# Patient Record
Sex: Male | Born: 1960 | Race: White | Hispanic: No | Marital: Single | Smoking: Current some day smoker
Health system: Southern US, Community
[De-identification: ages and names within clinical notes are randomized; demographics above are authoritative.]

## PROBLEM LIST (undated history)

## (undated) DIAGNOSIS — J449 Chronic obstructive pulmonary disease, unspecified: Secondary | ICD-10-CM

## (undated) DIAGNOSIS — I10 Essential (primary) hypertension: Secondary | ICD-10-CM

## (undated) DIAGNOSIS — I350 Nonrheumatic aortic (valve) stenosis: Secondary | ICD-10-CM

## (undated) DIAGNOSIS — I639 Cerebral infarction, unspecified: Secondary | ICD-10-CM

## (undated) DIAGNOSIS — K219 Gastro-esophageal reflux disease without esophagitis: Secondary | ICD-10-CM

## (undated) DIAGNOSIS — I499 Cardiac arrhythmia, unspecified: Secondary | ICD-10-CM

## (undated) DIAGNOSIS — J189 Pneumonia, unspecified organism: Secondary | ICD-10-CM

## (undated) DIAGNOSIS — IMO0001 Reserved for inherently not codable concepts without codable children: Secondary | ICD-10-CM

## (undated) DIAGNOSIS — G4730 Sleep apnea, unspecified: Secondary | ICD-10-CM

## (undated) DIAGNOSIS — K5900 Constipation, unspecified: Secondary | ICD-10-CM

## (undated) DIAGNOSIS — M1990 Unspecified osteoarthritis, unspecified site: Secondary | ICD-10-CM

## (undated) DIAGNOSIS — I2699 Other pulmonary embolism without acute cor pulmonale: Secondary | ICD-10-CM

## (undated) DIAGNOSIS — F1910 Other psychoactive substance abuse, uncomplicated: Secondary | ICD-10-CM

## (undated) DIAGNOSIS — I4891 Unspecified atrial fibrillation: Secondary | ICD-10-CM

## (undated) HISTORY — PX: FINGER SURGERY: SHX640

## (undated) HISTORY — PX: TONSILLECTOMY: SUR1361

## (undated) HISTORY — PX: HAND SURGERY: SHX662

---

## 2000-12-27 ENCOUNTER — Encounter: Payer: Self-pay | Admitting: *Deleted

## 2000-12-27 ENCOUNTER — Emergency Department (HOSPITAL_COMMUNITY): Admission: EM | Admit: 2000-12-27 | Discharge: 2000-12-27 | Payer: Self-pay | Admitting: *Deleted

## 2003-03-28 ENCOUNTER — Emergency Department (HOSPITAL_COMMUNITY): Admission: EM | Admit: 2003-03-28 | Discharge: 2003-03-29 | Payer: Self-pay

## 2003-04-30 ENCOUNTER — Emergency Department (HOSPITAL_COMMUNITY): Admission: EM | Admit: 2003-04-30 | Discharge: 2003-04-30 | Payer: Self-pay | Admitting: Emergency Medicine

## 2003-05-05 ENCOUNTER — Emergency Department (HOSPITAL_COMMUNITY): Admission: EM | Admit: 2003-05-05 | Discharge: 2003-05-06 | Payer: Self-pay | Admitting: Emergency Medicine

## 2003-08-21 ENCOUNTER — Other Ambulatory Visit: Payer: Self-pay

## 2004-08-07 ENCOUNTER — Ambulatory Visit: Payer: Self-pay | Admitting: Nurse Practitioner

## 2004-08-08 ENCOUNTER — Ambulatory Visit: Payer: Self-pay | Admitting: *Deleted

## 2004-08-22 ENCOUNTER — Ambulatory Visit: Payer: Self-pay | Admitting: Nurse Practitioner

## 2004-08-25 ENCOUNTER — Emergency Department (HOSPITAL_COMMUNITY): Admission: EM | Admit: 2004-08-25 | Discharge: 2004-08-25 | Payer: Self-pay | Admitting: Emergency Medicine

## 2005-01-20 ENCOUNTER — Emergency Department (HOSPITAL_COMMUNITY): Admission: EM | Admit: 2005-01-20 | Discharge: 2005-01-20 | Payer: Self-pay | Admitting: Emergency Medicine

## 2006-05-20 ENCOUNTER — Emergency Department (HOSPITAL_COMMUNITY): Admission: EM | Admit: 2006-05-20 | Discharge: 2006-05-21 | Payer: Self-pay | Admitting: Emergency Medicine

## 2007-01-18 ENCOUNTER — Emergency Department (HOSPITAL_COMMUNITY): Admission: EM | Admit: 2007-01-18 | Discharge: 2007-01-18 | Payer: Self-pay | Admitting: Emergency Medicine

## 2007-01-24 ENCOUNTER — Emergency Department (HOSPITAL_COMMUNITY): Admission: EM | Admit: 2007-01-24 | Discharge: 2007-01-24 | Payer: Self-pay | Admitting: Emergency Medicine

## 2007-02-02 ENCOUNTER — Emergency Department (HOSPITAL_COMMUNITY): Admission: EM | Admit: 2007-02-02 | Discharge: 2007-02-02 | Payer: Self-pay | Admitting: Emergency Medicine

## 2011-02-22 ENCOUNTER — Emergency Department (HOSPITAL_COMMUNITY)
Admission: EM | Admit: 2011-02-22 | Discharge: 2011-02-22 | Disposition: A | Discharge status: Home / Self Care | Payer: Self-pay | Attending: Emergency Medicine | Admitting: Emergency Medicine

## 2011-02-22 DIAGNOSIS — M545 Low back pain, unspecified: Secondary | ICD-10-CM | POA: Insufficient documentation

## 2011-02-22 DIAGNOSIS — I10 Essential (primary) hypertension: Secondary | ICD-10-CM | POA: Insufficient documentation

## 2011-03-13 ENCOUNTER — Emergency Department (HOSPITAL_COMMUNITY)
Admission: EM | Admit: 2011-03-13 | Discharge: 2011-03-13 | Disposition: A | Discharge status: Home / Self Care | Payer: Medicaid Other | Attending: Emergency Medicine | Admitting: Emergency Medicine

## 2011-03-13 ENCOUNTER — Emergency Department (HOSPITAL_COMMUNITY): Payer: Medicaid Other

## 2011-03-13 DIAGNOSIS — G8929 Other chronic pain: Secondary | ICD-10-CM | POA: Insufficient documentation

## 2011-03-13 DIAGNOSIS — J449 Chronic obstructive pulmonary disease, unspecified: Secondary | ICD-10-CM | POA: Insufficient documentation

## 2011-03-13 DIAGNOSIS — R05 Cough: Secondary | ICD-10-CM | POA: Insufficient documentation

## 2011-03-13 DIAGNOSIS — R0989 Other specified symptoms and signs involving the circulatory and respiratory systems: Secondary | ICD-10-CM | POA: Insufficient documentation

## 2011-03-13 DIAGNOSIS — T24039A Burn of unspecified degree of unspecified lower leg, initial encounter: Secondary | ICD-10-CM | POA: Insufficient documentation

## 2011-03-13 DIAGNOSIS — R059 Cough, unspecified: Secondary | ICD-10-CM | POA: Insufficient documentation

## 2011-03-13 DIAGNOSIS — Z79899 Other long term (current) drug therapy: Secondary | ICD-10-CM | POA: Insufficient documentation

## 2011-03-13 DIAGNOSIS — X19XXXA Contact with other heat and hot substances, initial encounter: Secondary | ICD-10-CM | POA: Insufficient documentation

## 2011-03-13 DIAGNOSIS — M549 Dorsalgia, unspecified: Secondary | ICD-10-CM | POA: Insufficient documentation

## 2011-03-13 DIAGNOSIS — R0609 Other forms of dyspnea: Secondary | ICD-10-CM | POA: Insufficient documentation

## 2011-03-13 DIAGNOSIS — T2102XA Burn of unspecified degree of abdominal wall, initial encounter: Secondary | ICD-10-CM | POA: Insufficient documentation

## 2011-03-13 DIAGNOSIS — I10 Essential (primary) hypertension: Secondary | ICD-10-CM | POA: Insufficient documentation

## 2011-03-13 DIAGNOSIS — J4489 Other specified chronic obstructive pulmonary disease: Secondary | ICD-10-CM | POA: Insufficient documentation

## 2011-03-13 LAB — POCT I-STAT TROPONIN I: Troponin i, poc: 0 ng/mL (ref 0.00–0.08)

## 2011-03-13 LAB — BASIC METABOLIC PANEL
BUN: 12 mg/dL (ref 6–23)
CO2: 28 mEq/L (ref 19–32)
Calcium: 9.1 mg/dL (ref 8.4–10.5)
Chloride: 105 mEq/L (ref 96–112)
Creatinine, Ser: 1.02 mg/dL (ref 0.50–1.35)
GFR calc Af Amer: >60 mL/min (ref >60)
GFR calc non Af Amer: >60 mL/min (ref >60)
Glucose, Bld: 107 mg/dL — ABNORMAL HIGH (ref 70–99)
Potassium: 3.9 mEq/L (ref 3.5–5.1)
Sodium: 141 mEq/L (ref 135–145)

## 2011-03-13 LAB — DIFFERENTIAL
Basophils Absolute: 0 10*3/uL (ref 0.0–0.1)
Basophils Relative: 0 % (ref 0–1)
Eosinophils Absolute: 0.2 10*3/uL (ref 0.0–0.7)
Eosinophils Relative: 3 % (ref 0–5)
Lymphocytes Relative: 21 % (ref 12–46)
Lymphs Abs: 1.4 10*3/uL (ref 0.7–4.0)
Monocytes Absolute: 0.5 10*3/uL (ref 0.1–1.0)
Monocytes Relative: 7 % (ref 3–12)
Neutro Abs: 4.4 10*3/uL (ref 1.7–7.7)
Neutrophils Relative %: 68 % (ref 43–77)

## 2011-03-13 LAB — CBC
HCT: 42.5 % (ref 39.0–52.0)
Hemoglobin: 14.9 g/dL (ref 13.0–17.0)
MCH: 31 pg (ref 26.0–34.0)
MCHC: 35.1 g/dL (ref 30.0–36.0)
MCV: 88.4 fL (ref 78.0–100.0)
Platelets: 208 10*3/uL (ref 150–400)
RBC: 4.81 MIL/uL (ref 4.22–5.81)
RDW: 12.8 % (ref 11.5–15.5)
WBC: 6.5 10*3/uL (ref 4.0–10.5)

## 2011-03-15 ENCOUNTER — Emergency Department (HOSPITAL_COMMUNITY): Admit: 2011-03-15 | Discharge: 2011-03-15 | Disposition: A | Discharge status: Home / Self Care | Payer: Medicaid Other

## 2011-03-15 ENCOUNTER — Emergency Department (HOSPITAL_COMMUNITY)
Admission: EM | Admit: 2011-03-15 | Discharge: 2011-03-15 | Discharge status: Against Medical Advice | Payer: Medicaid Other | Attending: Emergency Medicine | Admitting: Emergency Medicine

## 2011-03-15 DIAGNOSIS — R0602 Shortness of breath: Secondary | ICD-10-CM | POA: Insufficient documentation

## 2011-04-10 ENCOUNTER — Emergency Department (HOSPITAL_COMMUNITY): Payer: Medicaid Other

## 2011-04-10 ENCOUNTER — Emergency Department (HOSPITAL_COMMUNITY)
Admission: EM | Admit: 2011-04-10 | Discharge: 2011-04-10 | Disposition: A | Discharge status: Home / Self Care | Payer: Medicaid Other | Attending: Emergency Medicine | Admitting: Emergency Medicine

## 2011-04-10 DIAGNOSIS — IMO0002 Reserved for concepts with insufficient information to code with codable children: Secondary | ICD-10-CM | POA: Insufficient documentation

## 2011-04-10 DIAGNOSIS — M542 Cervicalgia: Secondary | ICD-10-CM | POA: Insufficient documentation

## 2011-04-10 DIAGNOSIS — M549 Dorsalgia, unspecified: Secondary | ICD-10-CM | POA: Insufficient documentation

## 2011-04-10 DIAGNOSIS — Y92410 Unspecified street and highway as the place of occurrence of the external cause: Secondary | ICD-10-CM | POA: Insufficient documentation

## 2011-04-10 DIAGNOSIS — I10 Essential (primary) hypertension: Secondary | ICD-10-CM | POA: Insufficient documentation

## 2011-04-10 DIAGNOSIS — M25569 Pain in unspecified knee: Secondary | ICD-10-CM | POA: Insufficient documentation

## 2011-04-10 DIAGNOSIS — Y998 Other external cause status: Secondary | ICD-10-CM | POA: Insufficient documentation

## 2011-04-17 ENCOUNTER — Emergency Department (HOSPITAL_COMMUNITY)
Admission: EM | Admit: 2011-04-17 | Discharge: 2011-04-17 | Discharge status: Against Medical Advice | Payer: Medicaid Other | Attending: Emergency Medicine | Admitting: Emergency Medicine

## 2011-04-17 DIAGNOSIS — R51 Headache: Secondary | ICD-10-CM | POA: Insufficient documentation

## 2012-07-29 ENCOUNTER — Emergency Department (HOSPITAL_COMMUNITY): Payer: Medicaid Other

## 2012-07-29 ENCOUNTER — Encounter (HOSPITAL_COMMUNITY): Payer: Self-pay

## 2012-07-29 ENCOUNTER — Emergency Department (HOSPITAL_COMMUNITY)
Admission: EM | Admit: 2012-07-29 | Discharge: 2012-07-30 | Disposition: A | Discharge status: Other Acute Inpatient Hospital | Payer: Medicaid Other | Source: Home / Self Care | Attending: Emergency Medicine | Admitting: Emergency Medicine

## 2012-07-29 DIAGNOSIS — F1420 Cocaine dependence, uncomplicated: Secondary | ICD-10-CM | POA: Diagnosis present

## 2012-07-29 DIAGNOSIS — F17200 Nicotine dependence, unspecified, uncomplicated: Secondary | ICD-10-CM | POA: Insufficient documentation

## 2012-07-29 DIAGNOSIS — Z79899 Other long term (current) drug therapy: Secondary | ICD-10-CM

## 2012-07-29 DIAGNOSIS — F1020 Alcohol dependence, uncomplicated: Principal | ICD-10-CM | POA: Diagnosis present

## 2012-07-29 DIAGNOSIS — J4489 Other specified chronic obstructive pulmonary disease: Secondary | ICD-10-CM | POA: Diagnosis present

## 2012-07-29 DIAGNOSIS — Z8739 Personal history of other diseases of the musculoskeletal system and connective tissue: Secondary | ICD-10-CM | POA: Insufficient documentation

## 2012-07-29 DIAGNOSIS — Z7901 Long term (current) use of anticoagulants: Secondary | ICD-10-CM

## 2012-07-29 DIAGNOSIS — F329 Major depressive disorder, single episode, unspecified: Secondary | ICD-10-CM

## 2012-07-29 DIAGNOSIS — I10 Essential (primary) hypertension: Secondary | ICD-10-CM | POA: Diagnosis present

## 2012-07-29 DIAGNOSIS — Z8679 Personal history of other diseases of the circulatory system: Secondary | ICD-10-CM | POA: Insufficient documentation

## 2012-07-29 DIAGNOSIS — F1410 Cocaine abuse, uncomplicated: Secondary | ICD-10-CM | POA: Insufficient documentation

## 2012-07-29 DIAGNOSIS — F3289 Other specified depressive episodes: Secondary | ICD-10-CM | POA: Insufficient documentation

## 2012-07-29 DIAGNOSIS — I4891 Unspecified atrial fibrillation: Secondary | ICD-10-CM | POA: Diagnosis present

## 2012-07-29 DIAGNOSIS — J449 Chronic obstructive pulmonary disease, unspecified: Secondary | ICD-10-CM | POA: Diagnosis present

## 2012-07-29 DIAGNOSIS — R45851 Suicidal ideations: Secondary | ICD-10-CM | POA: Insufficient documentation

## 2012-07-29 HISTORY — DX: Essential (primary) hypertension: I10

## 2012-07-29 HISTORY — DX: Unspecified osteoarthritis, unspecified site: M19.90

## 2012-07-29 HISTORY — DX: Unspecified atrial fibrillation: I48.91

## 2012-07-29 HISTORY — DX: Other psychoactive substance abuse, uncomplicated: F19.10

## 2012-07-29 LAB — CBC
MCHC: 33.6 g/dL (ref 30.0–36.0)
Platelets: 214 10*3/uL (ref 150–400)
RDW: 13.2 % (ref 11.5–15.5)

## 2012-07-29 LAB — COMPREHENSIVE METABOLIC PANEL
ALT: 17 U/L (ref 0–53)
AST: 18 U/L (ref 0–37)
Albumin: 3.3 g/dL — ABNORMAL LOW (ref 3.5–5.2)
Alkaline Phosphatase: 59 U/L (ref 39–117)
Potassium: 3.8 mEq/L (ref 3.5–5.1)
Sodium: 140 mEq/L (ref 135–145)
Total Protein: 6.4 g/dL (ref 6.0–8.3)

## 2012-07-29 LAB — ETHANOL: Alcohol, Ethyl (B): <11 mg/dL (ref 0–11)

## 2012-07-29 MED ORDER — ZOLPIDEM TARTRATE 5 MG PO TABS: 5.0000 mg | ORAL_TABLET | Freq: Every evening | ORAL | Status: DC | PRN

## 2012-07-29 MED ORDER — ONDANSETRON HCL 8 MG PO TABS: 4.0000 mg | ORAL_TABLET | Freq: Three times a day (TID) | ORAL | Status: DC | PRN

## 2012-07-29 MED ORDER — NICOTINE 21 MG/24HR TD PT24: 21.0000 mg | MEDICATED_PATCH | Freq: Every day | TRANSDERMAL | Status: DC

## 2012-07-29 MED ORDER — LORAZEPAM 1 MG PO TABS: 1.0000 mg | ORAL_TABLET | Freq: Three times a day (TID) | ORAL | Status: DC | PRN

## 2012-07-29 MED ORDER — ALUM & MAG HYDROXIDE-SIMETH 200-200-20 MG/5ML PO SUSP: 30.0000 mL | ORAL | Status: DC | PRN

## 2012-07-29 MED ORDER — IBUPROFEN 200 MG PO TABS: 600.0000 mg | ORAL_TABLET | Freq: Three times a day (TID) | ORAL | Status: DC | PRN

## 2012-07-29 NOTE — ED Notes (Signed)
Press photographer and house coverage notified for sitter.

## 2012-07-29 NOTE — ED Provider Notes (Signed)
History   This chart was scribed for Clinton Sawyer, non-physician practitioner, working with Glynn Octave, MD by Charolett Bumpers, ED Scribe. This patient was seen in room TR11C/TR11C and the patient's care was started at 1901.    CSN: [id number]  Arrival date & time 07/29/12  1738   First MD Initiated Contact with Patient 07/29/12 1901      Chief Complaint  Patient presents with  . Medical Clearance   Level V Caveat: Intoxication  The history is provided by the patient. The history is limited by the condition of the patient. No language interpreter was used.   Donald [name] is a 52 y.o. male who presents to the Emergency Department for medical clearance. He state that he is here for detox from ETOH and crack cocaine. He states that he last used crack cocaine at 3 am today and last used alcohol this morning. He states that he drinks daily, unsure of amount but states he drinks a lot and that he doesn't remember the last time he was sober. He reports SI with a plan to jump off a bridge. He denies any HI, hallucinations. He is unsure if he has ever had alcohol related seizures. He denies any SOB, CP, nausea, vomiting. Hx is limited due to Level V Caveat: Intoxication.    Past Medical History  Diagnosis Date  . Substance abuse   . A-fib   . Hypertension   . Arthritis     History reviewed. No pertinent past surgical history.  No family history on file.  History  Substance Use Topics  . Smoking status: Current Some Day Smoker  . Smokeless tobacco: Not on file  . Alcohol Use: Yes      Review of Systems  Unable to perform ROS: Other  Intoxication  Allergies  Review of patient's allergies indicates no known allergies.  Home Medications  No current outpatient prescriptions on file.  BP 147/84  Pulse 83  Temp 98.1 F (36.7 C) (Oral)  SpO2 100%  Physical Exam  Nursing note and vitals reviewed. Constitutional: He is oriented to person, place, and time. He  appears well-developed and well-nourished. No distress.       Pt smells of ETOH, appears intoxicated, slurred speech.   HENT:  Head: Normocephalic and atraumatic.  Eyes: EOM are normal. Pupils are equal, round, and reactive to light.  Neck: Neck supple. No tracheal deviation present.  Cardiovascular: Normal rate, regular rhythm, normal heart sounds and intact distal pulses.   Pulmonary/Chest: Effort normal. No respiratory distress.       Scattered inspiratory and expiratory crackles.   Musculoskeletal: Normal range of motion.  Neurological: He is alert and oriented to person, place, and time.  Skin: Skin is warm and dry.  Psychiatric: He has a normal mood and affect. His behavior is normal. His speech is tangential and slurred. He expresses suicidal ideation. He expresses no homicidal ideation. He expresses suicidal plans.    ED Course  Procedures (including critical care time)  DIAGNOSTIC STUDIES: Oxygen Saturation is 100% on room air, normal by my interpretation.    COORDINATION OF CARE:  19:20-Discussed planned course of treatment with the patient, who is agreeable at this time. Will order a chest x-ray, blood work, urine screen, consultation with ACT and move pt to Pod C.     Labs Reviewed - No data to display Dg Chest 2 View  07/29/2012  *RADIOLOGY REPORT*  Clinical Data: Medical clearance.  CHEST - 2 VIEW  Comparison: 03/13/2011  Findings: The cardiomediastinal silhouette is unremarkable. COPD/emphysema noted. There is no evidence of focal airspace disease, pulmonary edema, suspicious pulmonary nodule/mass, pleural effusion, or pneumothorax. No acute bony abnormalities are identified. Remote left rib fractures are identified.  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   Original Report Authenticated By: Harmon Pier, M.D.      No diagnosis found.    MDM  52 year old male presenting for detox from alcohol and cocaine. He is suicidal. ACT  team consulted and will evaluate patient. Patient medically cleared and moved to pod C.   I personally performed the services described in this documentation, which was scribed in my presence. The recorded information has been reviewed and is accurate.      Trevor Mace, PA-C 07/29/12 2339

## 2012-07-29 NOTE — ED Notes (Signed)
Pt states he drank a "pint" of gin. Pt states last drink was at approximately 0300.

## 2012-07-29 NOTE — ED Notes (Signed)
Belongings inventoried by this RN, locked in locker 3. Will wait to patient more awake to sign for medications to give to pharmacy.

## 2012-07-29 NOTE — ED Notes (Signed)
Pt transported to radiology.

## 2012-07-29 NOTE — ED Notes (Signed)
Security paged to wand pt. Pt resting.

## 2012-07-29 NOTE — ED Notes (Signed)
Pt states he is here for detox from ETOH and crack cocaine. Also reports that he has been having thoughts of jumping off of a bridge to kill himself for a day or so.

## 2012-07-29 NOTE — ED Notes (Signed)
Pt waned by security.

## 2012-07-30 ENCOUNTER — Inpatient Hospital Stay (HOSPITAL_COMMUNITY)
Admission: EM | Admit: 2012-07-30 | Discharge: 2012-08-03 | DRG: 897 | Disposition: A | Discharge status: Home / Self Care | Payer: Medicaid Other | Source: Intra-hospital | Attending: Psychiatry | Admitting: Psychiatry

## 2012-07-30 ENCOUNTER — Encounter (HOSPITAL_COMMUNITY): Payer: Self-pay | Admitting: Behavioral Health

## 2012-07-30 DIAGNOSIS — F1010 Alcohol abuse, uncomplicated: Secondary | ICD-10-CM

## 2012-07-30 DIAGNOSIS — F1410 Cocaine abuse, uncomplicated: Secondary | ICD-10-CM

## 2012-07-30 DIAGNOSIS — F1020 Alcohol dependence, uncomplicated: Secondary | ICD-10-CM | POA: Diagnosis present

## 2012-07-30 DIAGNOSIS — F1994 Other psychoactive substance use, unspecified with psychoactive substance-induced mood disorder: Secondary | ICD-10-CM

## 2012-07-30 DIAGNOSIS — F1420 Cocaine dependence, uncomplicated: Secondary | ICD-10-CM | POA: Diagnosis present

## 2012-07-30 HISTORY — DX: Chronic obstructive pulmonary disease, unspecified: J44.9

## 2012-07-30 LAB — URINALYSIS, ROUTINE W REFLEX MICROSCOPIC
Hgb urine dipstick: NEGATIVE
Ketones, ur: NEGATIVE mg/dL
Protein, ur: NEGATIVE mg/dL
Urobilinogen, UA: 0.2 mg/dL (ref 0.0–1.0)

## 2012-07-30 LAB — URINE MICROSCOPIC-ADD ON

## 2012-07-30 LAB — RAPID URINE DRUG SCREEN, HOSP PERFORMED
Amphetamines: NOT DETECTED
Benzodiazepines: NOT DETECTED
Tetrahydrocannabinol: NOT DETECTED

## 2012-07-30 LAB — PROTIME-INR: Prothrombin Time: 13 seconds (ref 11.6–15.2)

## 2012-07-30 MED ORDER — MAGNESIUM HYDROXIDE 400 MG/5ML PO SUSP: 30.0000 mL | Freq: Every day | ORAL | Status: DC | PRN

## 2012-07-30 MED ORDER — DILTIAZEM HCL ER COATED BEADS 180 MG PO CP24
180.0000 mg | ORAL_CAPSULE | Freq: Every day | ORAL | Status: DC
  Administered 2012-07-30 – 2012-08-03 (×5): 180 mg via ORAL
  Filled 2012-07-30 (×7): qty 1

## 2012-07-30 MED ORDER — LORAZEPAM 1 MG PO TABS: 0.0000 mg | ORAL_TABLET | Freq: Four times a day (QID) | ORAL | Status: DC

## 2012-07-30 MED ORDER — LORAZEPAM 2 MG/ML IJ SOLN: 1.0000 mg | Freq: Four times a day (QID) | INTRAMUSCULAR | Status: DC | PRN

## 2012-07-30 MED ORDER — LORAZEPAM 1 MG PO TABS: 0.0000 mg | ORAL_TABLET | Freq: Two times a day (BID) | ORAL | Status: DC

## 2012-07-30 MED ORDER — METOPROLOL TARTRATE 50 MG PO TABS
50.0000 mg | ORAL_TABLET | Freq: Two times a day (BID) | ORAL | Status: DC
  Administered 2012-07-30 – 2012-08-03 (×8): 50 mg via ORAL
  Filled 2012-07-30 (×11): qty 1

## 2012-07-30 MED ORDER — WARFARIN - PHARMACIST DOSING INPATIENT
Freq: Every day | Status: DC
  Administered 2012-08-01 – 2012-08-02 (×2)
  Filled 2012-07-30 (×4): qty 1

## 2012-07-30 MED ORDER — ACETAMINOPHEN 325 MG PO TABS: 650.0000 mg | ORAL_TABLET | Freq: Four times a day (QID) | ORAL | Status: DC | PRN

## 2012-07-30 MED ORDER — AMIODARONE HCL 200 MG PO TABS
200.0000 mg | ORAL_TABLET | Freq: Every day | ORAL | Status: DC
  Administered 2012-07-30 – 2012-08-03 (×5): 200 mg via ORAL
  Filled 2012-07-30 (×6): qty 1

## 2012-07-30 MED ORDER — FOLIC ACID 1 MG PO TABS: 1.0000 mg | ORAL_TABLET | Freq: Every day | ORAL | Status: DC

## 2012-07-30 MED ORDER — ADULT MULTIVITAMIN W/MINERALS CH: 1.0000 | ORAL_TABLET | Freq: Every day | ORAL | Status: DC

## 2012-07-30 MED ORDER — LORAZEPAM 1 MG PO TABS: 1.0000 mg | ORAL_TABLET | Freq: Four times a day (QID) | ORAL | Status: DC | PRN

## 2012-07-30 MED ORDER — WARFARIN SODIUM 5 MG PO TABS
7.5000 mg | ORAL_TABLET | Freq: Once | ORAL | Status: AC
  Administered 2012-07-30: 7.5 mg via ORAL
  Filled 2012-07-30: qty 2
  Filled 2012-07-30: qty 1.5

## 2012-07-30 MED ORDER — VITAMIN B-1 100 MG PO TABS: 100.0000 mg | ORAL_TABLET | Freq: Every day | ORAL | Status: DC

## 2012-07-30 MED ORDER — ALBUTEROL SULFATE HFA 108 (90 BASE) MCG/ACT IN AERS
2.0000 | INHALATION_SPRAY | Freq: Four times a day (QID) | RESPIRATORY_TRACT | Status: DC | PRN
  Administered 2012-07-30 – 2012-08-03 (×5): 2 via RESPIRATORY_TRACT
  Filled 2012-07-30: qty 6.7

## 2012-07-30 MED ORDER — NICOTINE 21 MG/24HR TD PT24
21.0000 mg | MEDICATED_PATCH | Freq: Every day | TRANSDERMAL | Status: DC
  Administered 2012-07-31 – 2012-08-01 (×2): 21 mg via TRANSDERMAL
  Filled 2012-07-30 (×5): qty 1

## 2012-07-30 MED ORDER — THIAMINE HCL 100 MG/ML IJ SOLN: 100.0000 mg | Freq: Every day | INTRAMUSCULAR | Status: DC

## 2012-07-30 MED ORDER — DILTIAZEM HCL ER BEADS 180 MG PO CP24: 180.0000 mg | ORAL_CAPSULE | Freq: Every day | ORAL | Status: DC

## 2012-07-30 MED ORDER — QUETIAPINE FUMARATE 25 MG PO TABS
25.0000 mg | ORAL_TABLET | Freq: Every day | ORAL | Status: DC
  Administered 2012-07-31: 25 mg via ORAL
  Filled 2012-07-30 (×7): qty 1

## 2012-07-30 MED ORDER — ALUM & MAG HYDROXIDE-SIMETH 200-200-20 MG/5ML PO SUSP: 30.0000 mL | ORAL | Status: DC | PRN

## 2012-07-30 NOTE — ED Notes (Signed)
Pt. Glasses taken out of belongings bag and given to patient.

## 2012-07-30 NOTE — Progress Notes (Signed)
ANTICOAGULATION CONSULT NOTE - Initial Consult  Pharmacy Consult for Coumadin Indication: atrial fibrillation  No Known Allergies  Patient Measurements: Height: 6' (182.9 cm) Weight: 196 lb (88.905 kg) IBW/kg (Calculated) : 77.6   Vital Signs: Temp: 98.9 F (37.2 C) (01/30 1700) Temp src: Oral (01/30 1700) BP: 112/76 mmHg (01/30 1705) Pulse Rate: 118  (01/30 1705)  Labs:  Basename 07/30/12 1956 07/29/12 2009  HGB -- 14.4  HCT -- 42.9  PLT -- 214  APTT -- --  LABPROT 13.0 --  INR 0.99 --  HEPARINUNFRC -- --  CREATININE -- 1.10  CKTOTAL -- --  CKMB -- --  TROPONINI -- --   Estimated Creatinine Clearance: 85.2 ml/min (by C-G formula based on Cr of 1.1).  Medical History: Past Medical History  Diagnosis Date  . Substance abuse   . A-fib   . Hypertension   . Arthritis   . COPD (chronic obstructive pulmonary disease)    Medications:  Prescriptions prior to admission  Medication Sig Dispense Refill  . albuterol (PROVENTIL HFA;VENTOLIN HFA) 108 (90 BASE) MCG/ACT inhaler Inhale 2 puffs into the lungs every 6 (six) hours as needed.      Marland Kitchen amiodarone (PACERONE) 200 MG tablet Take 200 mg by mouth daily.      Marland Kitchen diltiazem (TIAZAC) 180 MG 24 hr capsule Take 180 mg by mouth daily.      . metoprolol (LOPRESSOR) 50 MG tablet Take 50 mg by mouth 2 (two) times daily.      Marland Kitchen warfarin (COUMADIN) 5 MG tablet Take 5-10 mg by mouth daily. Take 5 mg every except take 10 mg on Fridays       Assessment:  51yo M admitted with depressive disorder and EtOH dependence.  On Coumadin chronically for A.fib but has not taken it for one week. INR is at baseline.  The regimen he reports above differs from the last prescription picked up: 5mg  daily except 2.5mg  on Mon, Fri.  He reports compliance with his amiodarone and his other meds.  Goal of Therapy:  INR 2-3 Monitor platelets by anticoagulation protocol: Yes   Plan:   Give Coumadin 7.5mg  tonight.  Check PT/INR daily.  Charolotte Eke, PharmD, pager [phone]. 07/30/2012,8:43 PM.

## 2012-07-30 NOTE — BH Assessment (Signed)
Assessment Note   Donald [name] is an 52 y.o. male who presents seeking detox and reporting suicidal ideation with a plan to jump off a bridge.  He also reports he has a pistol, but states he does not know where it is right now. Donald [name] states he believes his depression is triggered by his worsening COPD and reports he is overwhelmed by it.  He is also drinking a pint of gin daily in addition to as many beers as he can get.  He has been drinking at this level for approximately 2 weeks after a 7 month period of sobriety.  In addition, he is also abusing Crack at approximately $100 a day for the last month.  He denies HI or AVH.  Axis I: Depressive Disorder NOS, Depressive Disorder secondary to general medical condition, Substance Abuse and Alcohol Dependence Axis II: Deferred Axis III:  Past Medical History  Diagnosis Date  . Substance abuse   . A-fib   . Hypertension   . Arthritis    Axis IV: housing problems and problems with access to health care services Axis V: 41-50 serious symptoms  Past Medical History:  Past Medical History  Diagnosis Date  . Substance abuse   . A-fib   . Hypertension   . Arthritis     History reviewed. No pertinent past surgical history.  Family History: No family history on file.  Social History:  reports that he has been smoking.  He does not have any smokeless tobacco history on file. He reports that he drinks alcohol. He reports that he uses illicit drugs (Cocaine).  Additional Social History:  Alcohol / Drug Use History of alcohol / drug use?: Yes Substance #1 Name of Substance 1: Alcohol-Beer and Gin 1 - Age of First Use: unk 1 - Amount (size/oz): pint of gin and as many beers as possible 1 - Frequency: daily 1 - Duration: 2 weeks 1 - Last Use / Amount: 07/29/12 pint plus beer Substance #2 Name of Substance 2: Crack 2 - Age of First Use: unk 2 - Amount (size/oz): $100+ 2 - Frequency: daily 2 - Duration: 1 month 2 - Last Use / Amount:  07/29/12  CIWA: CIWA-Ar BP: 147/84 mmHg Pulse Rate: 83  Nausea and Vomiting: no nausea and no vomiting Tactile Disturbances: very mild itching, pins and needles, burning or numbness Tremor: no tremor Auditory Disturbances: not present Paroxysmal Sweats: no sweat visible Visual Disturbances: not present Anxiety: mildly anxious Headache, Fullness in Head: none present Agitation: normal activity Orientation and Clouding of Sensorium: oriented and can do serial additions CIWA-Ar Total: 2  COWS:    Allergies: No Known Allergies  Home Medications:  (Not in a hospital admission)  OB/GYN Status:  No LMP for male patient.  General Assessment Data Location of Assessment: [facility] ED Living Arrangements: Other (Comment);Alone (homeless in [place]) Can pt return to current living arrangement?: Yes Admission Status: Voluntary Is patient capable of signing voluntary admission?: Yes Transfer from: Acute Hospital Referral Source: Self/Family/Friend  Education Status Is patient currently in school?: No  Risk to self Suicidal Ideation: Yes-Currently Present Suicidal Intent: No-Not Currently/Within Last 6 Months Is patient at risk for suicide?: Yes Suicidal Plan?: Yes-Currently Present Specify Current Suicidal Plan: jump off a bridge Access to Means: Yes Specify Access to Suicidal Means: environmental, also reports firearm What has been your use of drugs/alcohol within the last 12 months?: ongoing Previous Attempts/Gestures: No Intentional Self Injurious Behavior: Damaging (drinking) Family Suicide History: No Recent stressful  life event(s): Recent negative physical changes (COPD) Persecutory voices/beliefs?: No Depression: Yes Depression Symptoms: Despondent;Isolating;Fatigue;Guilt;Loss of interest in usual pleasures;Feeling worthless/self pity;Feeling angry/irritable Substance abuse history and/or treatment for substance abuse?: Yes Suicide prevention information given to  non-admitted patients: Not applicable  Risk to Others Homicidal Ideation: No Thoughts of Harm to Others: No Current Homicidal Intent: No Current Homicidal Plan: No Access to Homicidal Means: No History of harm to others?: No Assessment of Violence: None Noted Does patient have access to weapons?: No Criminal Charges Pending?: No Does patient have a court date: No  Psychosis Hallucinations: None noted Delusions: None noted  Mental Status Report Appear/Hygiene: Disheveled Eye Contact: Poor Motor Activity: Freedom of movement Speech: Slurred Level of Consciousness: Drowsy Mood: Depressed Affect: Appropriate to circumstance Anxiety Level: None Thought Processes: Coherent;Relevant Judgement: Impaired Orientation: Time;Place;Person;Situation Obsessive Compulsive Thoughts/Behaviors: None  Cognitive Functioning Concentration: Decreased Memory: Recent Intact;Remote Intact IQ: Average Insight: Fair Impulse Control: Poor Appetite: Fair Sleep: No Change Vegetative Symptoms: None  ADLScreening [facility]) Patient's cognitive ability adequate to safely complete daily activities?: Yes Patient able to express need for assistance with ADLs?: Yes Independently performs ADLs?: Yes (appropriate for developmental age)  Abuse/Neglect [facility]) Physical Abuse: Denies Verbal Abuse: Denies Sexual Abuse: Denies  Prior Inpatient Therapy Prior Inpatient Therapy: Yes Prior Therapy Dates: can't remember Prior Therapy Facilty/Provider(s): [facility] Reason for Treatment: Detox  Prior Outpatient Therapy Prior Outpatient Therapy: No  ADL Screening (condition at time of admission) Patient's cognitive ability adequate to safely complete daily activities?: Yes Patient able to express need for assistance with ADLs?: Yes Independently performs ADLs?: Yes (appropriate for developmental age)       Abuse/Neglect Assessment (Assessment to be complete while patient is alone) Physical  Abuse: Denies Verbal Abuse: Denies Sexual Abuse: Denies Exploitation of patient/patient's resources: Denies Self-Neglect: Denies Values / Beliefs Cultural Requests During Hospitalization: None Spiritual Requests During Hospitalization: None   Advance Directives (For Healthcare) Advance Directive: Patient does not have advance directive;Patient would not like information Nutrition Screen- [facility] Adult/[facility]/AP Patient's home diet: Regular  Additional Information 1:1 In Past 12 Months?: No CIRT Risk: No Elopement Risk: No Does patient have medical clearance?: Yes     Disposition:  Disposition Disposition of Patient: Inpatient treatment program Type of inpatient treatment program: Adult  On Site Evaluation by:   Reviewed with Physician:     Steward Ros 07/30/2012 1:28 AM

## 2012-07-30 NOTE — ED Notes (Signed)
Pt. Up at nurses station making a phone call to director of a half-way house in [place]. Pt states "I just want him to know that I'm here and I'm all right".

## 2012-07-30 NOTE — Progress Notes (Signed)
D. Pt has been in room and resting for much of the shift today, pt has been visualized interacting appropriately with fellow peers in dayroom. Pt has spoken about how he was feeling suicidal and about his substance abuse issues. Pt spoke about being in and out of rehab and how he will be able to get medicaid part B starting in February. Pt has spoken about not being able to stop abusing alcohol and requesting help. A. Pt provided with support and encouragement. R. Will continue to monitor.

## 2012-07-30 NOTE — ED Notes (Signed)
ACT team at bedside.  

## 2012-07-30 NOTE — Progress Notes (Signed)
Patient did attend the evening karaoke group. Pt was engaged and supportive.   

## 2012-07-30 NOTE — BH Assessment (Signed)
[facility] Assessment Progress Note      Donald [name] has been accepted to [facility] by Donell Sievert to [facility] and will go to room 305-1.

## 2012-07-30 NOTE — ED Provider Notes (Signed)
Medical screening examination/treatment/procedure(s) were performed by non-physician practitioner and as supervising physician I was immediately available for consultation/collaboration.   Glynn Octave, MD 07/30/12 0010

## 2012-07-30 NOTE — Tx Team (Signed)
Initial Interdisciplinary Treatment Plan  PATIENT STRENGTHS: (choose at least two) Ability for insight Capable of independent living Communication skills Motivation for treatment/growth  PATIENT STRESSORS: Financial difficulties Health problems Substance abuse   PROBLEM LIST: Problem List/Patient Goals Date to be addressed Date deferred Reason deferred Estimated date of resolution  Substance Abuse      Suicide Ideation                                                 DISCHARGE CRITERIA:  Ability to meet basic life and health needs Improved stabilization in mood, thinking, and/or behavior Motivation to continue treatment in a less acute level of care  PRELIMINARY DISCHARGE PLAN: Attend aftercare/continuing care group Attend 12-step recovery group Outpatient therapy  PATIENT/FAMIILY INVOLVEMENT: This treatment plan has been presented to and reviewed with the patient, Donald [name].  The patient and family have been given the opportunity to ask questions and make suggestions.  Harold Barban E 07/30/2012, 10:39 AM

## 2012-07-30 NOTE — ED Provider Notes (Signed)
  Physical Exam  BP 119/72  Pulse 86  Temp 97.9 F (36.6 C) (Oral)  Resp 20  SpO2 93%  Physical Exam  ED Course  Procedures  MDM Pt accepted at [facility] by Dr. Dara Hoyer, MD 07/30/12 [phone]

## 2012-07-30 NOTE — Progress Notes (Signed)
Admission Note  D: Patient admitted to [facility] from [facility] ED. Patient appropriate and cooperative with staff. Per report, patient was SI with a plan to jump off a bridge. Patient verbalized that he abuses alcohol and cocaine.  A: Support and encouragement provided to patient. Oriented patient to the unit and informed him of the unit rules/policies. Initiated Q15 minute checks for safety.  R: Patient receptive. Denies SI/HI/AVH. Patient remains safe on the unit.

## 2012-07-30 NOTE — H&P (Signed)
Psychiatric Admission Assessment Adult  Patient Identification:  Donald [name]  Date of Evaluation:  07/30/2012  Chief Complaint:  Depressive Disorder ETOH Dependence  History of Present Illness: This is a 52 year old Caucasian male, admitted to [facility] from the [facility] ED with reports of suicidal ideations with plans to jump off of a bridge.  Patient also indicated that he owned a pistol that he could use to hurt himself if he knew where it is. And [name] reports during this assessment, "I was referred to the [facility] ED by the [facility]. I had asked for help for my alcoholism. I have been abusing alcohol for a long time. I like the feel and effects of alcohol on me. I also use crack cocaine as much as I can get. I'm a binge crack smoker. Alcohol has done a number on me. In 2004, I spent 10 months in prison for drunk driving. Alcoholism has caused me a lot of relationships, jobs and I lost my home too. I am currently homeless. I was at the [facility] 3 days ago for help for my alcoholism, the assessment people did not even look at me. They turned me away. I have to admit that I have a problem. I have been through series of rehabilitation and treatment programs in the past, but I have not been able to quit this mess. The longest that I have been sober was 7 months. I have been at the [facility] treatment [facility] numerous times. I am disabled, with a lot of heart problems. I take coumadin. It was prescribed for me by a doctor at the [facility] in [place], [place]. I have not taken any of my medicines in about 1 week. My situation depresses me because it is not getting any better".  O: Patient denies any withdrawal symptoms at this time.  Elements:  Location:  [facility] adult unit,. Quality:  "I crave & drink alcohol all day, I'm a binge crack smoker as much as I can get". Severity:  "It has worsened over last 4-6 months, I drink and use  daily". Timing:  "4-6 months".. Duration:  "i have a been an alcoholic for a long time".. Context:  "I'm homeless, I'm suicidal and I'm depressed"..  Associated Signs/Synptoms:  Depression Symptoms:  depressed mood, feelings of worthlessness/guilt, hopelessness, suicidal thoughts with specific plan,  (Hypo) Manic Symptoms:  Irritable Mood,  Anxiety Symptoms:  Excessive Worry,  Psychotic Symptoms:  Denies hallucinations, delusions and or paranoia.  PTSD Symptoms: Had a traumatic exposure:  None reported.  Psychiatric Specialty Exam: Physical Exam  Constitutional: He is oriented to person, place, and time. He appears well-developed.  HENT:  Head: Normocephalic.  Eyes: Pupils are equal, round, and reactive to light.  Neck: Normal range of motion.  Cardiovascular: Normal rate.   Respiratory: Effort normal.  GI: Soft.  Musculoskeletal: Normal range of motion.  Neurological: He is alert and oriented to person, place, and time.  Skin: Skin is warm and dry.  Psychiatric: Judgment normal. His mood appears not anxious. His affect is not angry and not blunt. He is slowed. He is not agitated, not aggressive, is not hyperactive, not withdrawn, not actively hallucinating and not combative. Thought content is not paranoid and not delusional. Cognition and memory are normal. He does not exhibit a depressed mood. He expresses suicidal ideation. He expresses no homicidal ideation. He expresses suicidal plans. He expresses no homicidal plans. He is inattentive.  Review of Systems  Constitutional: Negative.   HENT: Negative.   Eyes: Negative.   Respiratory: Negative.   Cardiovascular: Negative.   Gastrointestinal: Negative.   Genitourinary: Negative.   Musculoskeletal: Negative.   Skin: Negative.   Neurological: Negative for dizziness, tingling, tremors, sensory change, speech change, focal weakness, seizures and loss of consciousness.  Endo/Heme/Allergies: Bruises/bleeds easily (Due to  coumadin use).  Psychiatric/Behavioral: Positive for suicidal ideas and substance abuse (Hx alcohol and crack cocaine abuse.). Negative for depression, hallucinations and memory loss. The patient is not nervous/anxious and does not have insomnia.     Blood pressure 131/91, pulse 99, temperature 98.2 F (36.8 C), temperature source Oral, resp. rate 20, height 6' (1.829 m), weight 88.905 kg (196 lb).Body mass index is 26.58 kg/(m^2).  General Appearance: Disheveled  Eye Solicitor::  Fair  Speech:  Slow  Volume:  Decreased  Mood:  Depressed  Affect:  Constricted  Thought Process:  Disorganized and Tangential  Orientation:  Full (Time, Place, and Person)  Thought Content:  Rumination  Suicidal Thoughts:  Yes.  with intent/plan  Homicidal Thoughts:  No  Memory:  Immediate;   Good Recent;   Good Remote;   Good  Judgement:  Impaired  Insight:  Shallow  Psychomotor Activity:  Decreased  Concentration:  Fair  Recall:  Good  Akathisia:  No  Handed:  Right  AIMS (if indicated):     Assets:  Desire for Improvement  Sleep:       Past Psychiatric History: Diagnosis:  Hospitalizations:  Outpatient Care:  Substance Abuse Care:  Self-Mutilation:  Suicidal Attempts:  Violent Behaviors:   Past Medical History:   Past Medical History  Diagnosis Date  . Substance abuse   . A-fib   . Hypertension   . Arthritis   . COPD (chronic obstructive pulmonary disease)    Cardiac History:  A-fib, HTN, COPD  Allergies:  No Known Allergies  PTA Medications: Prescriptions prior to admission  Medication Sig Dispense Refill  . albuterol (PROVENTIL HFA;VENTOLIN HFA) 108 (90 BASE) MCG/ACT inhaler Inhale 2 puffs into the lungs every 6 (six) hours as needed.      Marland Kitchen amiodarone (PACERONE) 200 MG tablet Take 200 mg by mouth daily.      Marland Kitchen diltiazem (TIAZAC) 180 MG 24 hr capsule Take 180 mg by mouth daily.      . metoprolol (LOPRESSOR) 50 MG tablet Take 50 mg by mouth 2 (two) times daily.      Marland Kitchen warfarin  (COUMADIN) 5 MG tablet Take 5-10 mg by mouth daily. Take 5 mg every except take 10 mg on Fridays        Previous Psychotropic Medications:  Medication/Dose  Coumadin 2.5 mg on Mondays & Fridays, Coumadin 5 mg on Tues, Wed, Thurs, Sat and Sun.               Substance Abuse History in the last 12 months:  yes  Consequences of Substance Abuse: Medical Consequences:  Liver damage, Possible death by overdose Legal Consequences:  Arrests, jail time, Loss of driving privilege. Family Consequences:  Family discord, divorce and or separation.  Social History:  reports that he has been smoking Cigarettes.  He has a 88 pack-year smoking history. He does not have any smokeless tobacco history on file. He reports that he drinks alcohol. He reports that he uses illicit drugs (Cocaine) about 7 times per week. Additional Social History: Pain Medications: None History of alcohol / drug use?: Yes Longest period of sobriety (when/how  long): 15 months Withdrawal Symptoms: Irritability Name of Substance 1: Cocaine 1 - Age of First Use: 52 y/o 1 - Frequency: daily 1 - Last Use / Amount: 3am 07/28/12  Current Place of Residence: [place], [place]   Place of Birth: [place], [place]  Family Members: "I have no one"  Marital Status:  Single  Children: 0  Sons: 0  Daughters: 0  Relationships: Single  Education:  No high school diploma.  Educational Problems/Performance: Did not complete high school.  Religious Beliefs/Practices: None reported  History of Abuse (Emotional/Phsycial/Sexual): Denies  Occupational Experiences: Disabled  Military History:  None.  Legal History: Hx of 10 months prison term.   Hobbies/Interests: "I like to drink alcohol"  Family History:  History reviewed. No pertinent family history.  Results for orders placed during the hospital encounter of 07/29/12 (from the past 72 hour(s))  CBC     Status: Normal   Collection Time   07/29/12  8:09 PM      Component  Value Range Comment   WBC 4.9  4.0 - 10.5 K/uL    RBC 4.78  4.22 - 5.81 MIL/uL    Hemoglobin 14.4  13.0 - 17.0 g/dL    HCT 40.9  81.1 - 91.4 %    MCV 89.7  78.0 - 100.0 fL    MCH 30.1  26.0 - 34.0 pg    MCHC 33.6  30.0 - 36.0 g/dL    RDW 78.2  95.6 - 21.3 %    Platelets 214  150 - 400 K/uL   COMPREHENSIVE METABOLIC PANEL     Status: Abnormal   Collection Time   07/29/12  8:09 PM      Component Value Range Comment   Sodium 140  135 - 145 mEq/L    Potassium 3.8  3.5 - 5.1 mEq/L    Chloride 104  96 - 112 mEq/L    CO2 30  19 - 32 mEq/L    Glucose, Bld 116 (*) 70 - 99 mg/dL    BUN 15  6 - 23 mg/dL    Creatinine, Ser 0.86  0.50 - 1.35 mg/dL    Calcium 8.6  8.4 - 57.8 mg/dL    Total Protein 6.4  6.0 - 8.3 g/dL    Albumin 3.3 (*) 3.5 - 5.2 g/dL    AST 18  0 - 37 U/L    ALT 17  0 - 53 U/L    Alkaline Phosphatase 59  39 - 117 U/L    Total Bilirubin 0.4  0.3 - 1.2 mg/dL    GFR calc non Af Amer 75 (*) >90 mL/min    GFR calc Af Amer 87 (*) >90 mL/min   ETHANOL     Status: Normal   Collection Time   07/29/12  8:09 PM      Component Value Range Comment   Alcohol, Ethyl (B) <11  0 - 11 mg/dL   ACETAMINOPHEN LEVEL     Status: Normal   Collection Time   07/29/12  8:10 PM      Component Value Range Comment   Acetaminophen (Tylenol), Serum <15.0  10 - 30 ug/mL   SALICYLATE LEVEL     Status: Abnormal   Collection Time   07/29/12  8:10 PM      Component Value Range Comment   Salicylate Lvl <2.0 (*) 2.8 - 20.0 mg/dL    Psychological Evaluations:  Assessment:   AXIS I:  Substance Induced Mood Disorder, alcohol abuse, cocaine abuse.  AXIS II:  Deferred AXIS III:   Past Medical History  Diagnosis Date  . Substance abuse   . A-fib   . Hypertension   . Arthritis   . COPD (chronic obstructive pulmonary disease)    AXIS IV:  economic problems, educational problems, housing problems, occupational problems, other psychosocial or environmental problems and problems with primary support  group AXIS V:  11-20 some danger of hurting self or others possible OR occasionally fails to maintain minimal personal hygiene OR gross impairment in communication  Treatment Plan/Recommendations: 1. Admit for crisis management and stabilization, estimated length of stay 3-5 days.  2. Medication management to reduce current symptoms to base line and improve the patient's overall level of functioning  3. Treat health problems as indicated.  4. Develop treatment plan to decrease risk of relapse upon discharge and the need for readmission.  5. Psycho-social education regarding relapse prevention and self care.  6. Health care follow up as needed for medical problems.  7. Review, reconcile, and reinstate any pertinent home medications for other health issues where appropriate. 9. Obtain PT/INR, Urinalysis and Urine drug screen. 8. Call for consults with hospitalist for any additional specialty patient care services as needed.   Treatment Plan Summary: Daily contact with patient to assess and evaluate symptoms and progress in treatment Medication management  Current Medications:  No current facility-administered medications for this encounter.    Observation Level/Precautions:  15 minute checks  Laboratory:  Obtain PT/INR  Psychotherapy: Group sessions/AA/NA meetings.   Medications:  See medication lists above  Consultations:  Pharmacy consult on coumadin use after PT/INR result.  Discharge Concerns:  Safety/maintaining sobriety  Estimated LOS: 3-5 days.  Other:     I certify that inpatient services furnished can reasonably be expected to improve the patient's condition.   Armandina Stammer I 1/30/201411:25 AM

## 2012-07-30 NOTE — [facility] Suicide Risk Assessment (Signed)
Suicide Risk Assessment  Admission Assessment     Nursing information obtained from:    Demographic factors:  Male Current Mental Status:  Suicidal ideation indicated by patient Loss Factors:  NA Historical Factors:  NA Risk Reduction Factors:  NA  CLINICAL FACTORS:   Alcohol/Substance Abuse/Dependencies  COGNITIVE FEATURES THAT CONTRIBUTE TO RISK: none identified   SUICIDE RISK:   Mild:  Suicidal ideation of limited frequency, intensity, duration, and specificity.  There are no identifiable plans, no associated intent, mild dysphoria and related symptoms, good self-control (both objective and subjective assessment), few other risk factors, and identifiable protective factors, including available and accessible social support.  PLAN OF CARE: Supportive approach/coping skills/relapse prevention                               Detox                               Reassess co morbidities  I certify that inpatient services furnished can reasonably be expected to improve the patient's condition.  Shneur Whittenburg A 07/30/2012, 2:05 PM

## 2012-07-30 NOTE — Clinical Social Work Note (Signed)
[facility] LCSW Group Therapy  07/30/2012 2:41 PM  Type of Therapy:  Group Therapy  Participation Level:  Active  Participation Quality:  Intrusive, lots of side conversations  Cognitive:  Alert and oriented  Insight:  Limited  Engagement in Therapy:  Limited  Modes of Intervention:  Activity, Clarification, Exploration, Socialization and Support  Summary of Progress/Problems: Focus of group processing discussion was on balance in life; the components in life which have a negative influence on balance and the components which make for a more balanced life.  Patient shared that he often feels like he does not fit in anywhere because of his addiction.  "I wouldn't be here lady if things were going well."  Clide Dales 07/30/2012, 2:41 PM

## 2012-07-31 DIAGNOSIS — F329 Major depressive disorder, single episode, unspecified: Secondary | ICD-10-CM

## 2012-07-31 DIAGNOSIS — F1020 Alcohol dependence, uncomplicated: Principal | ICD-10-CM

## 2012-07-31 DIAGNOSIS — F1420 Cocaine dependence, uncomplicated: Secondary | ICD-10-CM

## 2012-07-31 MED ORDER — ALBUTEROL SULFATE (5 MG/ML) 0.5% IN NEBU
2.5000 mg | INHALATION_SOLUTION | Freq: Four times a day (QID) | RESPIRATORY_TRACT | Status: DC
  Filled 2012-07-31 (×4): qty 0.5

## 2012-07-31 MED ORDER — ALBUTEROL SULFATE (5 MG/ML) 0.5% IN NEBU
2.5000 mg | INHALATION_SOLUTION | Freq: Four times a day (QID) | RESPIRATORY_TRACT | Status: DC | PRN
  Administered 2012-07-31: 2.5 mg via RESPIRATORY_TRACT

## 2012-07-31 MED ORDER — BUPROPION HCL ER (SR) 100 MG PO TB12: 100.0000 mg | ORAL_TABLET | Freq: Every day | ORAL | Status: DC

## 2012-07-31 MED ORDER — BUPROPION HCL ER (XL) 150 MG PO TB24
150.0000 mg | ORAL_TABLET | Freq: Every day | ORAL | Status: DC
  Administered 2012-08-01 – 2012-08-03 (×3): 150 mg via ORAL
  Filled 2012-07-31 (×4): qty 1

## 2012-07-31 MED ORDER — WARFARIN SODIUM 10 MG PO TABS
10.0000 mg | ORAL_TABLET | Freq: Once | ORAL | Status: DC
  Filled 2012-07-31: qty 1

## 2012-07-31 MED ORDER — ADULT MULTIVITAMIN W/MINERALS CH
1.0000 | ORAL_TABLET | Freq: Every day | ORAL | Status: DC
  Administered 2012-07-31 – 2012-08-03 (×4): 1 via ORAL
  Filled 2012-07-31 (×5): qty 1

## 2012-07-31 MED ORDER — WARFARIN SODIUM 7.5 MG PO TABS
7.5000 mg | ORAL_TABLET | Freq: Once | ORAL | Status: AC
  Administered 2012-07-31: 7.5 mg via ORAL
  Filled 2012-07-31: qty 1

## 2012-07-31 NOTE — Progress Notes (Signed)
Date: 07/31/2012  Time: 1015  Group Topic/Focus:  Identifying Needs: The focus of this group is to help patients identify their personal needs that have been historically problematic and identify healthy behaviors to address their needs.  Participation Level: Active  Participation Quality: Appropriate  Affect: Appropriate  Cognitive: Alert and Appropriate  Insight: Engaged  Engagement in Group: Engaged  Additional Comments:  Nazaret Chea A   

## 2012-07-31 NOTE — Progress Notes (Signed)
[facility] MD Progress Note  07/31/2012 4:06 PM Donald [name]  MRN:  [id number] Subjective:  Admits to feeling down, with lack of energy, motivation. He has shortness of breath. States he needs his nebulizer. He is not sure where he is going from here endorses he needs more help. Worried about relapsing and the effect the relapses are having on him Diagnosis:  Alcohol, Cocaine Dependence, Depressive Disorder NOS  ADL's:  Intact  Sleep: Poor  Appetite:  Fair  Suicidal Ideation:  Plan:  Denies Intent:  Denies Means:  Denies Homicidal Ideation:  Plan:  Denies Intent:  Denies Means:  Denies AEB (as evidenced by):  Psychiatric Specialty Exam: Review of Systems  Constitutional: Negative.   HENT: Negative.   Eyes: Negative.   Respiratory: Positive for cough and shortness of breath.   Cardiovascular: Negative.   Gastrointestinal: Negative.   Genitourinary: Negative.   Musculoskeletal: Negative.   Skin: Negative.   Neurological: Negative.   Endo/Heme/Allergies: Negative.   Psychiatric/Behavioral: Positive for depression and substance abuse. The patient has insomnia.     Blood pressure 133/87, pulse 79, temperature 99 F (37.2 C), temperature source Oral, resp. rate 22, height 6' (1.829 m), weight 88.905 kg (196 lb), SpO2 96.00%.Body mass index is 26.58 kg/(m^2).  General Appearance: Disheveled  Eye Solicitor::  Fair  Speech:  Clear and Coherent  Volume:  Decreased  Mood:  Anxious and Depressed  Affect:  Restricted  Thought Process:  Coherent and Goal Directed  Orientation:  Full (Time, Place, and Person)  Thought Content:  WDL and symptoms  Suicidal Thoughts:  No  Homicidal Thoughts:  No  Memory:  Immediate;   Fair Recent;   Fair Remote;   Fair  Judgement:  Fair  Insight:  Present  Psychomotor Activity:  Decreased  Concentration:  Fair  Recall:  Fair  Akathisia:  No  Handed:  Right  AIMS (if indicated):     Assets:  Desire for Improvement  Sleep:  Number of Hours: 4     Current Medications: Current Facility-Administered Medications  Medication Dose Route Frequency Provider Last Rate Last Dose  . acetaminophen (TYLENOL) tablet 650 mg  650 mg Oral Q6H PRN Sanjuana Kava, NP      . albuterol (PROVENTIL HFA;VENTOLIN HFA) 108 (90 BASE) MCG/ACT inhaler 2 puff  2 puff Inhalation Q6H PRN Sanjuana Kava, NP   2 puff at 07/31/12 0551  . albuterol (PROVENTIL) (5 MG/ML) 0.5% nebulizer solution 2.5 mg  2.5 mg Nebulization Q6H PRN Rachael Fee, MD      . alum & mag hydroxide-simeth (MAALOX/MYLANTA) 200-200-20 MG/5ML suspension 30 mL  30 mL Oral Q4H PRN Sanjuana Kava, NP      . amiodarone (PACERONE) tablet 200 mg  200 mg Oral Daily Sanjuana Kava, NP   200 mg at 07/31/12 0904  . diltiazem (CARDIZEM CD) 24 hr capsule 180 mg  180 mg Oral Daily Sanjuana Kava, NP   180 mg at 07/31/12 0903  . magnesium hydroxide (MILK OF MAGNESIA) suspension 30 mL  30 mL Oral Daily PRN Sanjuana Kava, NP      . metoprolol (LOPRESSOR) tablet 50 mg  50 mg Oral BID Sanjuana Kava, NP   50 mg at 07/31/12 0904  . multivitamin with minerals tablet 1 tablet  1 tablet Oral Daily Lavena Bullion, RD      . nicotine (NICODERM CQ - dosed in mg/24 hours) patch 21 mg  21 mg Transdermal Q0600 Nelda Marseille  Nwoko, NP   21 mg at 07/31/12 0553  . QUEtiapine (SEROQUEL) tablet 25 mg  25 mg Oral QHS Rachael Fee, MD      . warfarin (COUMADIN) tablet 7.5 mg  7.5 mg Oral ONCE-1800 Rachael Fee, MD      . Warfarin - Pharmacist Dosing Inpatient   Does not apply q1800 Sanjuana Kava, NP        Lab Results:  Results for orders placed during the hospital encounter of 07/30/12 (from the past 48 hour(s))  URINE RAPID DRUG SCREEN (HOSP PERFORMED)     Status: Abnormal   Collection Time   07/30/12  5:38 PM      Component Value Range Comment   Opiates NONE DETECTED  NONE DETECTED    Cocaine POSITIVE (*) NONE DETECTED    Benzodiazepines NONE DETECTED  NONE DETECTED    Amphetamines NONE DETECTED  NONE DETECTED     Tetrahydrocannabinol NONE DETECTED  NONE DETECTED    Barbiturates NONE DETECTED  NONE DETECTED   URINALYSIS, ROUTINE W REFLEX MICROSCOPIC     Status: Abnormal   Collection Time   07/30/12  5:38 PM      Component Value Range Comment   Color, Urine YELLOW  YELLOW    APPearance TURBID (*) CLEAR    Specific Gravity, Urine 1.023  1.005 - 1.030    pH 6.0  5.0 - 8.0    Glucose, UA NEGATIVE  NEGATIVE mg/dL    Hgb urine dipstick NEGATIVE  NEGATIVE    Bilirubin Urine NEGATIVE  NEGATIVE    Ketones, ur NEGATIVE  NEGATIVE mg/dL    Protein, ur NEGATIVE  NEGATIVE mg/dL    Urobilinogen, UA 0.2  0.0 - 1.0 mg/dL    Nitrite NEGATIVE  NEGATIVE    Leukocytes, UA NEGATIVE  NEGATIVE   URINE MICROSCOPIC-ADD ON     Status: Abnormal   Collection Time   07/30/12  5:38 PM      Component Value Range Comment   Squamous Epithelial / LPF RARE  RARE    WBC, UA 0-2  <3 WBC/hpf    Bacteria, UA FEW (*) RARE    Casts HYALINE CASTS (*) NEGATIVE    Crystals CA OXALATE CRYSTALS (*) NEGATIVE    Urine-Other AMORPHOUS URATES/PHOSPHATES     PROTIME-INR     Status: Normal   Collection Time   07/30/12  7:56 PM      Component Value Range Comment   Prothrombin Time 13.0  11.6 - 15.2 seconds    INR 0.99  0.00 - 1.49     Physical Findings: AIMS:  , ,  ,  ,    CIWA:  CIWA-Ar Total: 3  COWS:     Treatment Plan Summary: Daily contact with patient to assess and evaluate symptoms and progress in treatment Medication management  Plan: Supportive approach/coping skills/relapse prevention           Will resume Wellbutrin. He was prescribed it but did not pursue            Explore placement options               Medical Decision Making Problem Points:  Review of last therapy session (1) and Review of psycho-social stressors (1) Data Points:  Review of new medications or change in dosage (2)  I certify that inpatient services furnished can reasonably be expected to improve the patient's condition.   Dru Laurel  A 07/31/2012, 4:06 PM
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
## 2012-07-31 NOTE — Clinical Social Work Note (Signed)
[facility] LCSW Group Therapy  07/31/2012  Type of Therapy: Group Therapy   Participation Level: Attentive OR asleep  Participation Quality: Attentive  Affect:  Appropriate   Cognitive:  Oriented   Insight: Limited   Engagement in Therapy: Minimal due to drowsiness  Modes of Intervention: Discussion, Education, Rapport Building, Socialization and Support   Summary of Progress/Problems: Topic for today's group was feelings about relapse. Group members were able to well identify with feelings leading up to relapse but unable to identify a point at which they could make a decision not to use. Discussion presented opening for CSW to provide education of Post Acute Withdrawal Syndrome which Ahmir slept through.  Irven did rise and identify with statement "the only thing harder than getting clean may be continuing to use.".   Donald [name]  07/31/2012

## 2012-07-31 NOTE — Progress Notes (Signed)
D Xzaiver is seen [facility] on the 300 hall today.Marland KitchenHe  Denied having SI within the past 24 hrs, he rates his depression and hopelessness " ok / ok " respectively and he states his DC plan is to " leave drugs alone".   A He is attending his groups as outlined in his POC. He requested nebulizer treatments and this was ordered by MD.   R Safety is in place and POC cont with therapeutic relationship fostered.

## 2012-07-31 NOTE — Progress Notes (Signed)
[facility] LCSW Aftercare Discharge Planning Group Note  07/31/2012 6:15 PM  Participation Quality:  Appropriate  Affect:  Appropriate  Cognitive:  Alert and Oriented  Insight:  Limited  Engagement in Group:  Limited  Modes of Intervention:  Clarification, Exploration, Orientation and Support  Summary of Progress/Problems: Donald [name] denies both SI and HI and reports his plan to possibly obtain housing on his own.  He spoke about resentment towards the people whom he had been living with and received support from other group members.   Clide Dales 07/31/2012, 6:15 PM

## 2012-07-31 NOTE — Progress Notes (Signed)
Nutrition Brief Note  Pt meets criteria for severe MALNUTRITION in the context of acute illness as evidenced by <50% estimated energy intake with 12.5% weight loss in the past 2 weeks per pt report.  Patient identified on the Malnutrition Screening Tool (MST) Report  Body mass index is 26.58 kg/(m^2). Patient meets criteria for overweight based on current BMI.   Pt reports being homeless for the past 2 weeks and the only thing he ate during that time was 2 McDoubles from [employer]. Pt reports he lost 28 pounds unintentionally in the past 2 weeks. Pt reports getting high daily in addition to alcohol abuse. Will order multivitamin. Pt reports since admission he has been eating excellent. Pt reports his appetite is so good that he could "eat the hind legs off a hobby horse". Pt without any nutrition intervention indicated at this time.   Levon Hedger MS, RD, LDN [phone] Pager [phone] After Hours Pager

## 2012-07-31 NOTE — Tx Team (Signed)
Interdisciplinary Treatment Plan Update (Adult)  Date: 07/31/2012  Time Reviewed: 10:10 AM  Progress in Treatment:  Attending groups: Yes Participating in groups: Yes Taking medication as prescribed: Yes  Tolerating medication: Yes  Family/Significant othe contact made: No; patient refuses to sign consent Patient understands diagnosis: Yes  Discussing patient identified problems/goals with staff: Yes  Medical problems stabilized or resolved: Yes  Denies suicidal/homicidal ideation: Yes  Issues/concerns per patient self-inventory:Inventory not available  Other: N/A   New problem(s) identified: None Identified   Reason for Continuation of Hospitalization:  Medication stabilization Withdrawal symptoms   Interventions implemented related to continuation of hospitalization: mood stabilization, medication monitoring and adjustment, group therapy and psycho education, suicide risk assessment, collateral contact, aftercare planning, ongoing physician assessments and safety checks q 15 mins   Additional comments: N/A   Estimated length of stay: 3 days   Discharge Plan: CSW is assessing for appropriate referrals.   New goal(s): N/A   Review of initial/current patient goals per problem list:  1. Goal: Patient will be able to identify effective and ineffective coping patterns   Met: No  Target Date: 3 days  As evidenced by: Patient report, group involvement and staff observations 2. Goal(s): Address suicidal ideation  Met: No  Target date: 3 days  As evidenced by: Staff observation and patient report 3. Complete Detox Protocol and Identify comprehensive mental wellness and sobriety plan  Met: No  Target date: Mon 08/03/12 As evidenced GN:FAOZ report, with support of CSW, will know discharge plan and who to see for followup  Attendees:  Patient:    Family:    Physician: Geoffery Lyons  07/31/2012 10:10 AM   Nursing: Vira Blanco, RN  07/31/2012 10:10 AM   Clinical Social Worker  Ronda Fairly  07/31/2012 10:10 AM   Other: Kae Heller, PA Student Intern  07/31/2012 10:10 AM   Other: Lucia Estelle, NP  07/31/2012 10:10 AM   Other:  07/31/2012 10:10 AM   Other:  07/31/2012 10:10 AM   Scribe for Treatment Team:  Carney Bern, LCSWA 07/31/2012 10:10 AM

## 2012-07-31 NOTE — Progress Notes (Signed)
ANTICOAGULATION CONSULT NOTE - Initial Consult  Pharmacy Consult for Coumadin Indication: atrial fibrillation  No Known Allergies  Patient Measurements: Height: 6' (182.9 cm) Weight: 196 lb (88.905 kg) IBW/kg (Calculated) : 77.6    Vital Signs: Temp: 98.2 F (36.8 C) (01/31 0600) Temp src: Oral (01/31 0600) BP: 120/81 mmHg (01/31 0601) Pulse Rate: 99  (01/31 0601)  Labs:  Basename 07/30/12 1956 07/29/12 2009  HGB -- 14.4  HCT -- 42.9  PLT -- 214  APTT -- --  LABPROT 13.0 --  INR 0.99 --  HEPARINUNFRC -- --  CREATININE -- 1.10  CKTOTAL -- --  CKMB -- --  TROPONINI -- --    Estimated Creatinine Clearance: 85.2 ml/min (by C-G formula based on Cr of 1.1).   Medical History: Past Medical History  Diagnosis Date  . Substance abuse   . A-fib   . Hypertension   . Arthritis   . COPD (chronic obstructive pulmonary disease)     Medications:  Scheduled:    . amiodarone  200 mg Oral Daily  . diltiazem  180 mg Oral Daily  . metoprolol  50 mg Oral BID  . nicotine  21 mg Transdermal Q0600  . QUEtiapine  25 mg Oral QHS  . warfarin  10 mg Oral ONCE-1800  . [COMPLETED] warfarin  7.5 mg Oral Once  . Warfarin - Pharmacist Dosing Inpatient   Does not apply q1800  . [DISCONTINUED] albuterol  2.5 mg Nebulization Q6H  . [DISCONTINUED] diltiazem  180 mg Oral Daily    Assessment: INR well below goal of INR 2-3.  No problems noted in chart with anticoagulation.   Patient non compliant for over 1 week at home.   Patient stated home dose is coumadin 5 mg daily except Coumadin 10 mg on Friday.  Prescription filled as Coumadin 5 mg daily on Tue, wed, Thur,  Sat and Sun and Coumadin 2.5 mg on Mon and Fri.   Will monitor labs and adjust as indicated  Goal of Therapy:  INR 2-3    Plan:  Coumadin 7.5 mg x 1 today at 1800 PT/INR in am  Will f/u with patient in am   Charyl Dancer 07/31/2012,9:16 AM

## 2012-07-31 NOTE — Progress Notes (Signed)
Patient ID: Donald [name], male   DOB: June 01, 1961, 52 y.o.   MRN: [id number]  DAR- Update note-3 am-7am  D: Pt observed sleeping in bed with eyes closed. RR even and unlabored. No distress noted  .  A: Q 15 minute checks were done for safety.  R: safety maintained on unit.

## 2012-07-31 NOTE — Progress Notes (Signed)
[facility] Group Notes:  (Nursing/MHT/Case Management/Adjunct)  Date:  07/31/2012  Time:  11:28 AM  Type of Therapy:  Therapeutic Activity  Participation Level:  Active  Participation Quality:  Appropriate, Attentive, Sharing and Supportive  Affect:  Appropriate  Cognitive:  Alert and Appropriate  Insight:  Appropriate and Good  Engagement in Group:  Engaged and Improving  Modes of Intervention:  Activity and Socialization   Dalia Heading 07/31/2012, 11:28 AM

## 2012-08-01 LAB — PROTIME-INR: Prothrombin Time: 13.9 seconds (ref 11.6–15.2)

## 2012-08-01 MED ORDER — HYDROXYZINE HCL 25 MG PO TABS
25.0000 mg | ORAL_TABLET | Freq: Every day | ORAL | Status: DC
  Administered 2012-08-01 – 2012-08-02 (×2): 25 mg via ORAL
  Filled 2012-08-01 (×3): qty 1

## 2012-08-01 MED ORDER — WARFARIN SODIUM 7.5 MG PO TABS
7.5000 mg | ORAL_TABLET | Freq: Once | ORAL | Status: AC
  Administered 2012-08-01: 7.5 mg via ORAL
  Filled 2012-08-01: qty 1

## 2012-08-01 NOTE — Progress Notes (Signed)
Adult Psychoeducational Group Note  Date:  08/01/2012 Time:  7:28 PM  Group Topic/Focus:  Managing Feelings:   The focus of this group is to identify what feelings patients have difficulty handling and develop a plan to handle them in a healthier way upon discharge.  Participation Level:  Did Not Attend  Donald [name] 08/01/2012, 7:28 PM

## 2012-08-01 NOTE — Progress Notes (Signed)
Patient ID: Donald [name], male   DOB: Aug 10, 1960, 52 y.o.   MRN: [id number] D: Pt presented with depressed mood and anxiety. Pt has no psychosocial complaints. Pt denies any s/s of withdrawals. Pt denies SI/HI/AVH and pain. Pt attended evening AA group and Interacted appropriately with peers. Cooperative with assessment. No acute distressed noted at this time.   A: Medications administered as prescribed. Writer encouraged pt to discuss feelings. Pt encouraged to come to staff with any question or concerns.  R: Patient remains safe. He is complaint with medications and group programming. Continue current POC.

## 2012-08-01 NOTE — Clinical Social Work Psychosocial (Signed)
[facility] Group Notes:  (Clinical Social Work)  08/01/2012  10:00-11:00AM  Summary of Progress/Problems:   The main focus of today's process group was for the patient to identify ways in which they have in the past sabotaged their own recovery. Cognitive Behavioral Therapy concepts about the interconnectedness of thoughts/feelings/actions were introduced, and there was practice time for the techniques of Thought Stopping and Thought Replacement. The patient expressed that Seroquel had made him quite "fuzzy" and he had a hard time participating, even though he made significant effort to do so.    Type of Therapy:  Group Therapy - Process - Motivational Interviewing  Participation Level:  Minimal  Participation Quality:  Drowsy and Sharing  Affect:  Flat  Cognitive:  Confused  Insight:  Engaged  Engagement in Therapy:  Engaged  Modes of Intervention:  Education, Teacher, English as a foreign language, Exploration, Discussion   Ambrose Mantle, LCSW 08/01/2012, 12:01 PM

## 2012-08-01 NOTE — Progress Notes (Addendum)
Donald [name] is seen out in the milieu. He interacts appropriately with other patients. He says he has not had any SI within the past 24 hrs, he rates his depression and hopelessness " 6 / 5 " and he says he Plans to " stay clean and go to meetings" for his DC plan.    A His blood was drawn this morning for a PT  /  INR, ( PT was 13.9, INR =  1.08) and he was ordered coumadin 7.5 mg PO and this was administered at 1800. He has taken his other  Medications as scheduled by the MD and tolerated these well.   R Safety is in place and POC cont with therapeutic relationship fostered.

## 2012-08-01 NOTE — Progress Notes (Signed)
ANTICOAGULATION CONSULT NOTE - Follow Up Consult  Pharmacy Consult for Coumadin Indication: atrial fibrillation  No Known Allergies  Patient Measurements: Height: 6' (182.9 cm) Weight: 196 lb (88.905 kg) IBW/kg (Calculated) : 77.6  Heparin Dosing Weight:   Vital Signs: Temp: 97.9 F (36.6 C) (02/01 0700) BP: 128/81 mmHg (02/01 0701) Pulse Rate: 98  (02/01 0701)  Labs:  Basename 08/01/12 0648 07/30/12 1956 07/29/12 2009  HGB -- -- 14.4  HCT -- -- 42.9  PLT -- -- 214  APTT -- -- --  LABPROT 13.9 13.0 --  INR 1.08 0.99 --  HEPARINUNFRC -- -- --  CREATININE -- -- 1.10  CKTOTAL -- -- --  CKMB -- -- --  TROPONINI -- -- --    Estimated Creatinine Clearance: 85.2 ml/min (by C-G formula based on Cr of 1.1).   Medications:  Scheduled:    . amiodarone  200 mg Oral Daily  . buPROPion  150 mg Oral Daily  . diltiazem  180 mg Oral Daily  . metoprolol  50 mg Oral BID  . multivitamin with minerals  1 tablet Oral Daily  . nicotine  21 mg Transdermal Q0600  . QUEtiapine  25 mg Oral QHS  . [COMPLETED] warfarin  7.5 mg Oral ONCE-1800  . Warfarin - Pharmacist Dosing Inpatient   Does not apply q1800  . [DISCONTINUED] albuterol  2.5 mg Nebulization Q6H  . [DISCONTINUED] buPROPion  100 mg Oral Daily  . [DISCONTINUED] warfarin  10 mg Oral ONCE-1800    Assessment: Patient has been give 2 doses of Coumadin 7.5 and his INR is only 1.08.  Taking 5 mg daily at home except on Fridays when he says he takes 10 mg.  Goal of Therapy:  INR 2-3    Plan:  Will give another dose of Coumadin 7.5 mg today and recheck INR in AM.  Malva Cogan 08/01/2012,8:44 AM

## 2012-08-01 NOTE — Progress Notes (Signed)
Psychoeducational Group Note  Date:  02/08/2012 Time: 1015  Group Topic/Focus:  Identifying Needs:   The focus of this group is to help patients identify their personal needs that have been historically problematic and identify healthy behaviors to address their needs.  Participation Level:  active Participation Quality: good Affect: flat Cognitive:  GOOD  Insight:  good  Engagement in Group: engaged  Additional Comments:   Pd rn bc [id number]

## 2012-08-01 NOTE — Progress Notes (Signed)
Patient did attend the evening speaker AA meeting.  

## 2012-08-01 NOTE — Progress Notes (Signed)
Adult Psychoeducational Group Note  Date:  08/01/2012 Time:  2000  Group Topic/Focus:  AA  Participation Level:  Active  Participation Quality:  Appropriate  Affect:  Appropriate  Cognitive:  Appropriate  Insight: Appropriate  Engagement in Group:  Engaged  Modes of Intervention:  Support  Additional Comments:  Pt attended AA group this evening.   Daniyah Fohl A 08/01/2012, 2:10 AM

## 2012-08-01 NOTE — Progress Notes (Signed)
Patient ID: Donald [name], male   DOB: [birth year], 52 y.o.   MRN: [id number] [facility] MD Progress Note  08/01/2012 3:33 PM Donald [name]  MRN:  [id number]  Subjective: "I feel sleepy and tired. Afraid to sleep deep at night. That is why I sleep bit by bit during the day. 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  My leaking valve may leak more when I'm in a deep sleep, blood will fill my chest and I will drown in my own blood. That is why I'm anxious about sleeping at night. I run short of breath on top of this".  Diagnosis:  Alcohol, Cocaine Dependence, Depressive Disorder NOS  ADL's:  Intact  Sleep: Fair  Appetite:  Fair  Suicidal Ideation:  Plan:  Denies Intent:  Denies Means:  Denies Homicidal Ideation:  Plan:  Denies Intent:  Denies Means:  Denies  AEB (as evidenced by): Per patient's reports.  Psychiatric Specialty Exam: Review of Systems  Constitutional: Negative.   HENT: Negative.   Eyes: Negative.   Respiratory: Positive for cough and shortness of breath.   Cardiovascular: Negative.   Gastrointestinal: Negative.   Genitourinary: Negative.   Musculoskeletal: Negative.   Skin: Negative.   Neurological: Negative.   Endo/Heme/Allergies: Negative.   Psychiatric/Behavioral: Positive for depression and substance abuse. The patient has insomnia.     Blood pressure 121/79, pulse 91, temperature 98.6 F (37 C), temperature source Oral, resp. rate 22, height 6' (1.829 m), weight 88.905 kg (196 lb), SpO2 96.00%.Body mass index is 26.58 kg/(m^2).  General Appearance: Disheveled  Eye Solicitor::  Fair  Speech:  Clear and Coherent  Volume:  Decreased  Mood:  Anxious and Depressed  Affect:  Restricted  Thought Process:  Coherent and Goal Directed  Orientation:  Full (Time, Place, and Person)  Thought Content:  WDL and symptoms  Suicidal Thoughts:  No  Homicidal Thoughts:  No  Memory:  Immediate;   Fair Recent;   Fair Remote;   Fair   Judgement:  Fair  Insight:  Present  Psychomotor Activity:  Decreased  Concentration:  Fair  Recall:  Fair  Akathisia:  No  Handed:  Right  AIMS (if indicated):     Assets:  Desire for Improvement  Sleep:  Number of Hours: 6    Current Medications: Current Facility-Administered Medications  Medication Dose Route Frequency Provider Last Rate Last Dose  . acetaminophen (TYLENOL) tablet 650 mg  650 mg Oral Q6H PRN Sanjuana Kava, NP      . albuterol (PROVENTIL HFA;VENTOLIN HFA) 108 (90 BASE) MCG/ACT inhaler 2 puff  2 puff Inhalation Q6H PRN Sanjuana Kava, NP   2 puff at 07/31/12 0551  . albuterol (PROVENTIL) (5 MG/ML) 0.5% nebulizer solution 2.5 mg  2.5 mg Nebulization Q6H PRN Rachael Fee, MD   2.5 mg at 07/31/12 1808  . alum & mag hydroxide-simeth (MAALOX/MYLANTA) 200-200-20 MG/5ML suspension 30 mL  30 mL Oral Q4H PRN Sanjuana Kava, NP      . amiodarone (PACERONE) tablet 200 mg  200 mg Oral Daily Sanjuana Kava, NP   200 mg at 08/01/12 0800  . buPROPion (WELLBUTRIN XL) 24 hr tablet 150 mg  150 mg Oral Daily Rachael Fee, MD   150 mg at 08/01/12 1109  . diltiazem (CARDIZEM CD) 24 hr capsule 180 mg  180 mg Oral Daily Sanjuana Kava, NP   180 mg at 08/01/12 1109  .  magnesium hydroxide (MILK OF MAGNESIA) suspension 30 mL  30 mL Oral Daily PRN Sanjuana Kava, NP      . metoprolol (LOPRESSOR) tablet 50 mg  50 mg Oral BID Sanjuana Kava, NP   50 mg at 08/01/12 1109  . multivitamin with minerals tablet 1 tablet  1 tablet Oral Daily Lavena Bullion, RD   1 tablet at 08/01/12 1109  . nicotine (NICODERM CQ - dosed in mg/24 hours) patch 21 mg  21 mg Transdermal Q0600 Sanjuana Kava, NP   21 mg at 08/01/12 1610  . QUEtiapine (SEROQUEL) tablet 25 mg  25 mg Oral QHS Rachael Fee, MD   25 mg at 07/31/12 2237  . warfarin (COUMADIN) tablet 7.5 mg  7.5 mg Oral ONCE-1800 Malva Cogan, [facility]      . Warfarin - Pharmacist Dosing Inpatient   Does not apply q1800 Sanjuana Kava, NP        Lab Results:  Results  for orders placed during the hospital encounter of 07/30/12 (from the past 48 hour(s))  URINE RAPID DRUG SCREEN (HOSP PERFORMED)     Status: Abnormal   Collection Time   07/30/12  5:38 PM      Component Value Range Comment   Opiates NONE DETECTED  NONE DETECTED    Cocaine POSITIVE (*) NONE DETECTED    Benzodiazepines NONE DETECTED  NONE DETECTED    Amphetamines NONE DETECTED  NONE DETECTED    Tetrahydrocannabinol NONE DETECTED  NONE DETECTED    Barbiturates NONE DETECTED  NONE DETECTED   URINALYSIS, ROUTINE W REFLEX MICROSCOPIC     Status: Abnormal   Collection Time   07/30/12  5:38 PM      Component Value Range Comment   Color, Urine YELLOW  YELLOW    APPearance TURBID (*) CLEAR    Specific Gravity, Urine 1.023  1.005 - 1.030    pH 6.0  5.0 - 8.0    Glucose, UA NEGATIVE  NEGATIVE mg/dL    Hgb urine dipstick NEGATIVE  NEGATIVE    Bilirubin Urine NEGATIVE  NEGATIVE    Ketones, ur NEGATIVE  NEGATIVE mg/dL    Protein, ur NEGATIVE  NEGATIVE mg/dL    Urobilinogen, UA 0.2  0.0 - 1.0 mg/dL    Nitrite NEGATIVE  NEGATIVE    Leukocytes, UA NEGATIVE  NEGATIVE   URINE MICROSCOPIC-ADD ON     Status: Abnormal   Collection Time   07/30/12  5:38 PM      Component Value Range Comment   Squamous Epithelial / LPF RARE  RARE    WBC, UA 0-2  <3 WBC/hpf    Bacteria, UA FEW (*) RARE    Casts HYALINE CASTS (*) NEGATIVE    Crystals CA OXALATE CRYSTALS (*) NEGATIVE    Urine-Other AMORPHOUS URATES/PHOSPHATES     PROTIME-INR     Status: Normal   Collection Time   07/30/12  7:56 PM      Component Value Range Comment   Prothrombin Time 13.0  11.6 - 15.2 seconds    INR 0.99  0.00 - 1.49   PROTIME-INR     Status: Normal   Collection Time   08/01/12  6:48 AM      Component Value Range Comment   Prothrombin Time 13.9  11.6 - 15.2 seconds    INR 1.08  0.00 - 1.49     Physical Findings: AIMS:  , ,  ,  ,    CIWA:  CIWA-Ar Total:  3  COWS:     Treatment Plan Summary: Daily contact with patient to assess  and evaluate symptoms and progress in treatment Medication management  Plan: Supportive approach/coping skills/relapse prevention. Start Hydroxyzine 25 mg Q hs prn. Encouraged out of room, participation in group sessions and application of coping skills when distressed. Will continue to monitor response to/adverse effects of medications in use to assure effectiveness. Continue to monitor mood, behavior and interaction with staff and other patients. Continue current plan of care.   Medical Decision Making Problem Points:  Review of last therapy session (1) and Review of psycho-social stressors (1) Data Points:  Review of new medications or change in dosage (2)  I certify that inpatient services furnished can reasonably be expected to improve the patient's condition.   Armandina Stammer I 08/01/2012, 3:33 PM

## 2012-08-02 MED ORDER — WARFARIN SODIUM 10 MG PO TABS
10.0000 mg | ORAL_TABLET | Freq: Once | ORAL | Status: AC
  Administered 2012-08-02: 10 mg via ORAL
  Filled 2012-08-02: qty 1

## 2012-08-02 NOTE — Clinical Social Work Psychosocial (Signed)
[facility] Group Notes:  (Clinical Social Work)  08/02/2012  10:00-11:00AM  Summary of Progress/Problems:   The main focus of today's process group was for the patient to define "support" and describe what healthy supports are, then to identify the patient's current support system and decide on other supports that can be put in place to prevent future hospitalizations.   Toward end of group, scaling questions were asked to determine patients' level of motivation to seek support and confidence in doing so (1 lowest - 10 highest). The patient expressed that he hopes to go to [facility] from here and was hopeful CSW could help him with that today.  Was encouraged to talk with CSW tomorrow about this.  He rated his motivation and confidence about building new supports both at 10.  He was only present for last 1/2 of group.  Type of Therapy:  Process Group with Motivational Interviewing  Participation Level:  Minimal  Participation Quality:  Attentive  Affect:  Blunted  Cognitive:  Oriented  Insight:  Developing/Improving  Engagement in Therapy:  Developing/Improving  Modes of Intervention:  Clarification, Education, Limit-setting, Problem-solving, Socialization, Support and Processing, Exploration, Discussion, Role-Play   Ambrose Mantle, LCSW 08/02/2012, 12:16 PM

## 2012-08-02 NOTE — Progress Notes (Signed)
Patient ID: Donald [name], male   DOB: 02-27-1961, 52 y.o.   MRN: [id number] D)  Was out and about on hall this evening, interacting well with staff and peers, attended group.  Refused seroquel this evening, states makes him too tired.  Denies SOB at this time, but states is an issue.  States would like a longer term tx, going to try to stay clean and sober and attend meetings.   A)  Will continue to monitor for safety.  Medications as ordered, continue POC R)  Safety maintained.

## 2012-08-02 NOTE — Progress Notes (Signed)
ANTICOAGULATION CONSULT NOTE - Follow Up Consult  Pharmacy Consult for Coumadin Indication: atrial fibrillation  No Known Allergies  Patient Measurements: Height: 6' (182.9 cm) Weight: 196 lb (88.905 kg) IBW/kg (Calculated) : 77.6    Vital Signs: BP: 132/87 mmHg (02/02 0701) Pulse Rate: 88  (02/02 0701)  Labs:  Basename 08/02/12 6644 08/01/12 0648 07/30/12 1956  HGB -- -- --  HCT -- -- --  PLT -- -- --  APTT -- -- --  LABPROT 15.3* 13.9 13.0  INR 1.23 1.08 0.99  HEPARINUNFRC -- -- --  CREATININE -- -- --  CKTOTAL -- -- --  CKMB -- -- --  TROPONINI -- -- --    Estimated Creatinine Clearance: 85.2 ml/min (by C-G formula based on Cr of 1.1).   Medications:  Scheduled:    . amiodarone  200 mg Oral Daily  . buPROPion  150 mg Oral Daily  . diltiazem  180 mg Oral Daily  . hydrOXYzine  25 mg Oral Q2200  . metoprolol  50 mg Oral BID  . multivitamin with minerals  1 tablet Oral Daily  . nicotine  21 mg Transdermal Q0600  . QUEtiapine  25 mg Oral QHS  . [COMPLETED] warfarin  7.5 mg Oral ONCE-1800  . Warfarin - Pharmacist Dosing Inpatient   Does not apply q1800    Assessment: Patient's INR is up to 1.23 but still rising slowly.  Goal of Therapy:  INR 2-3    Plan:  Will give Coumadin 10 mg today and recheck INR in AM.  Malva Cogan 08/02/2012,9:33 AM

## 2012-08-02 NOTE — Progress Notes (Signed)
Psychoeducational Group Note  Date:  08/02/2012 Time:  1015  Group Topic/Focus:  Making Healthy Choices:   The focus of this group is to help patients identify negative/unhealthy choices they were using prior to admission and identify positive/healthier coping strategies to replace them upon discharge.  Participation Level:  Active  Participation Quality:  attentive  Affect:  Appropriate  Cognitive:  Appropriate  Insight:  Engaged  Engagement in Group:  Engaged  Additional Comments:    Dione Housekeeper 08/02/2012

## 2012-08-02 NOTE — Progress Notes (Signed)
Patient did attend the evening speaker AA meeting.  

## 2012-08-02 NOTE — Progress Notes (Signed)
Donald [name] is seen out in the milieu. He initially is grumpy and agitated when staff awakens him for his meds this AM. He admits that his poor breathing has a negative effect on his mood. HE is compliant with his POC, attending his groups and taking his scheduled meds as ordered.   A PT / INR today are 15.3 and 1.23 and he is ordered to be given 10 mg coumadin at 1800 today.   R Safety is in place. He is aware of his AA goals, and also aware of the challenges he faces in maintaining his sobriety. HE completed his AM self inventory and on it he wrote he denied SI, he rated his depression and hopelessness " 5 / 8 " , respectively, and states his DC plan is  " not to use drugs or alcohol" POC in place and therapeutic relationship cont.\

## 2012-08-02 NOTE — Progress Notes (Signed)
Psychoeducational Group Note  Psychoeducational Group Note  Date: 08/02/2012 Time:  08/02/2012  Group Topic/Focus:  Gratefulness:  The focus of this group is to help patients identify what two things they are most grateful for in their lives. What helps ground them and to center them on their work to their recovery.  Participation Level:  Active  Participation Quality:  Appropriate  Affect:  Appropriate  Cognitive:  Appropriate  Insight:  Engaged  Engagement in Group:  Engaged  Additional Comments:    Kyan Giannone A   

## 2012-08-02 NOTE — Progress Notes (Addendum)
Patient ID: Donald [name], male   DOB: [birth year], 52 y.o.   MRN: [id number] [facility] MD Progress Note  08/02/2012 1:45 PM Donald [name]  MRN:  [id number]  Subjective: "I'm ready to get out of here." Met with the patient to discuss his response to treatment. States he is going to [facility], if there is a bed, but doesn't really have a "Plan B if there is no [facility] bed." Thinks he could go to a shelter or a sober living home in the area. States no withdrawal symptoms at all. Diagnosis:  Alcohol, Cocaine Dependence, Depressive Disorder NOS  ADL's:  Intact  Sleep: "alright."  Appetite:  "great!"  Suicidal Ideation:  Plan:  Denies Intent:  Denies Means:  Denies Homicidal Ideation:  Plan:  Denies Intent:  Denies Means:  Denies  AEB (as evidenced by): Per patient's reports.  Psychiatric Specialty Exam: Review of Systems  Constitutional: Negative.  Negative for chills and diaphoresis.  HENT: Negative.   Eyes: Negative.  Negative for blurred vision.  Respiratory: Positive for cough. Shortness of breath: improving.   Cardiovascular: Negative.   Gastrointestinal: Positive for diarrhea. Negative for nausea and vomiting.  Genitourinary: Negative.   Musculoskeletal: Negative.   Skin: Negative.   Neurological: Negative.  Negative for dizziness, tingling and headaches.  Endo/Heme/Allergies: Negative.   Psychiatric/Behavioral: Positive for depression and substance abuse. Negative for suicidal ideas. The patient has insomnia (improved).     Blood pressure 112/80, pulse 81, temperature 98.5 F (36.9 C), temperature source Oral, resp. rate 22, height 6' (1.829 m), weight 88.905 kg (196 lb), SpO2 97.00%.Body mass index is 26.58 kg/(m^2).  General Appearance: hospital scrubs  Eye Contact::  Fair  Speech:  Clear and Coherent  Volume:  Decreased  Mood:  Anxious and Depressed  Affect:  congruent  Thought Process:  Coherent and Goal Directed  Orientation:  Full (Time, Place, and Person)  Thought  Content:  WDL and symptoms  Suicidal Thoughts:  No  Homicidal Thoughts:  No  Memory:  Immediate;   Fair Recent;   Fair Remote;   Fair  Judgement:  Fair  Insight:  Present  Psychomotor Activity:  Decreased  Concentration:  Fair  Recall:  Fair  Akathisia:  No  Handed:  Right  AIMS (if indicated):     Assets:  Desire for Improvement  Sleep:  Number of Hours: 4.75    Current Medications: Current Facility-Administered Medications  Medication Dose Route Frequency Provider Last Rate Last Dose  . acetaminophen (TYLENOL) tablet 650 mg  650 mg Oral Q6H PRN Sanjuana Kava, NP      . albuterol (PROVENTIL HFA;VENTOLIN HFA) 108 (90 BASE) MCG/ACT inhaler 2 puff  2 puff Inhalation Q6H PRN Sanjuana Kava, NP   2 puff at 08/02/12 0840  . albuterol (PROVENTIL) (5 MG/ML) 0.5% nebulizer solution 2.5 mg  2.5 mg Nebulization Q6H PRN Rachael Fee, MD   2.5 mg at 07/31/12 1808  . alum & mag hydroxide-simeth (MAALOX/MYLANTA) 200-200-20 MG/5ML suspension 30 mL  30 mL Oral Q4H PRN Sanjuana Kava, NP      . amiodarone (PACERONE) tablet 200 mg  200 mg Oral Daily Sanjuana Kava, NP   200 mg at 08/02/12 0840  . buPROPion (WELLBUTRIN XL) 24 hr tablet 150 mg  150 mg Oral Daily Rachael Fee, MD   150 mg at 08/02/12 0840  . diltiazem (CARDIZEM CD) 24 hr capsule 180 mg  180 mg Oral Daily Sanjuana Kava, NP  180 mg at 08/02/12 0840  . hydrOXYzine (ATARAX/VISTARIL) tablet 25 mg  25 mg Oral Q2200 Sanjuana Kava, NP   25 mg at 08/01/12 2146  . magnesium hydroxide (MILK OF MAGNESIA) suspension 30 mL  30 mL Oral Daily PRN Sanjuana Kava, NP      . metoprolol (LOPRESSOR) tablet 50 mg  50 mg Oral BID Sanjuana Kava, NP   50 mg at 08/02/12 0840  . multivitamin with minerals tablet 1 tablet  1 tablet Oral Daily Lavena Bullion, RD   1 tablet at 08/02/12 0800  . nicotine (NICODERM CQ - dosed in mg/24 hours) patch 21 mg  21 mg Transdermal Q0600 Sanjuana Kava, NP   21 mg at 08/01/12 1610  . QUEtiapine (SEROQUEL) tablet 25 mg  25 mg Oral  QHS Rachael Fee, MD   25 mg at 07/31/12 2237  . warfarin (COUMADIN) tablet 10 mg  10 mg Oral ONCE-1800 Malva Cogan, [facility]      . Warfarin - Pharmacist Dosing Inpatient   Does not apply q1800 Sanjuana Kava, NP        Lab Results:  Results for orders placed during the hospital encounter of 07/30/12 (from the past 48 hour(s))  PROTIME-INR     Status: Normal   Collection Time   08/01/12  6:48 AM      Component Value Range Comment   Prothrombin Time 13.9  11.6 - 15.2 seconds    INR 1.08  0.00 - 1.49   PROTIME-INR     Status: Abnormal   Collection Time   08/02/12  6:42 AM      Component Value Range Comment   Prothrombin Time 15.3 (*) 11.6 - 15.2 seconds    INR 1.23  0.00 - 1.49     Physical Findings: AIMS:  , ,  ,  ,    CIWA:  CIWA-Ar Total: 3  COWS:     Treatment Plan Summary: Daily contact with patient to assess and evaluate symptoms and progress in treatment Medication management  Plan: Supportive approach/coping skills/relapse prevention. Start Hydroxyzine 25 mg Q hs prn. Encouraged out of room, participation in group sessions and application of coping skills when distressed. Will continue to monitor response to/adverse effects of medications in use to assure effectiveness. Continue to monitor mood, behavior and interaction with staff and other patients. Continue current plan of care.   Medical Decision Making Problem Points:  Review of last therapy session (1) and Review of psycho-social stressors (1) Data Points:  Review of new medications or change in dosage (2)  I certify that inpatient services furnished can reasonably be expected to improve the patient's condition.  Rona Ravens. Shamari Trostel PAC 08/02/2012, 1:45 PM

## 2012-08-03 MED ORDER — WARFARIN SODIUM 7.5 MG PO TABS
7.5000 mg | ORAL_TABLET | Freq: Every day | ORAL | Status: DC
  Filled 2012-08-03: qty 2

## 2012-08-03 MED ORDER — METOPROLOL TARTRATE 50 MG PO TABS: 50.0000 mg | ORAL_TABLET | Freq: Two times a day (BID) | ORAL | Status: DC

## 2012-08-03 MED ORDER — WARFARIN SODIUM 7.5 MG PO TABS
7.5000 mg | ORAL_TABLET | Freq: Once | ORAL | Status: DC
  Filled 2012-08-03: qty 1

## 2012-08-03 MED ORDER — ALBUTEROL SULFATE HFA 108 (90 BASE) MCG/ACT IN AERS: 2.0000 | INHALATION_SPRAY | Freq: Four times a day (QID) | RESPIRATORY_TRACT | Status: DC | PRN

## 2012-08-03 MED ORDER — HYDROXYZINE HCL 25 MG PO TABS: 25.0000 mg | ORAL_TABLET | Freq: Every day | ORAL | Status: DC

## 2012-08-03 MED ORDER — AMIODARONE HCL 200 MG PO TABS: 200.0000 mg | ORAL_TABLET | Freq: Every day | ORAL | Status: DC

## 2012-08-03 MED ORDER — DILTIAZEM HCL ER BEADS 180 MG PO CP24: 180.0000 mg | ORAL_CAPSULE | Freq: Every day | ORAL | Status: DC

## 2012-08-03 MED ORDER — BUPROPION HCL ER (XL) 150 MG PO TB24: 150.0000 mg | ORAL_TABLET | Freq: Every day | ORAL | Status: DC

## 2012-08-03 MED ORDER — QUETIAPINE FUMARATE 25 MG PO TABS: 25.0000 mg | ORAL_TABLET | Freq: Every day | ORAL | Status: DC

## 2012-08-03 MED ORDER — WARFARIN SODIUM 7.5 MG PO TABS
7.5000 mg | ORAL_TABLET | Freq: Every day | ORAL | Status: DC
Start: 2012-08-03 — End: 2015-02-04

## 2012-08-03 NOTE — Progress Notes (Signed)
Adult Psychoeducational Group Note  Date:  08/03/2012 Time:  12:06 PM  Group Topic/Focus:  Healthy Communication:   The focus of this group is to discuss communication, barriers to communication, as well as healthy ways to communicate with others.  Participation Level:  Active  Participation Quality:  Appropriate and Attentive  Affect:  Appropriate  Cognitive:  Appropriate and Oriented  Insight: Good  Engagement in Group:  Engaged in the discussion and program.  Modes of Intervention:  Discussion and Education  Additional Comments:  Pt stated he wants to straighten his life out.   Annalucia Laino T 08/03/2012, 12:06 PM

## 2012-08-03 NOTE — [facility] Suicide Risk Assessment (Signed)
Suicide Risk Assessment  Discharge Assessment     Demographic Factors:  Caucasian and Unemployed  Mental Status Per Nursing Assessment::   On Admission:  Suicidal ideation indicated by patient  Current Mental Status by Physician: In full contact with reality. His mood is euthymic, his affect is appropriate. He feels ready to be discharged. He would go to [facility] from here or he has a safe place to go while waiting for a bed at [facility]. He has to get out and take care of couple of things before he can go in. He is committed to abstinence. He is fully detox.    Loss Factors: Decline in physical health  Historical Factors: NA  Risk Reduction Factors:   wants to be better  Continued Clinical Symptoms: Alcohol and Cocaine Dependece   Cognitive Features That Contribute To Risk:  Closed-mindedness    Suicide Risk:  Minimal: No identifiable suicidal ideation.  Patients presenting with no risk factors but with morbid ruminations; may be classified as minimal risk based on the severity of the depressive symptoms  Discharge Diagnoses:   AXIS I:  Alcohol, Cocaine Dependence AXIS II:  Deferred AXIS III:   Past Medical History  Diagnosis Date  . Substance abuse   . A-fib   . Hypertension   . Arthritis   . COPD (chronic obstructive pulmonary disease)    AXIS IV:  housing problems, occupational problems and other psychosocial or environmental problems AXIS V:  61-70 mild symptoms  Plan Of Care/Follow-up recommendations:  Activity:  As tolerated Diet:  Regular Will follow up outpatient basis Is patient on multiple antipsychotic therapies at discharge:  No   Has Patient had three or more failed trials of antipsychotic monotherapy by history:  No  Recommended Plan for Multiple Antipsychotic Therapies: N/A   Donald [name] A 08/03/2012, 1:08 PM

## 2012-08-03 NOTE — Discharge Summary (Signed)
Physician Discharge Summary Note  Patient:  Donald [name] is an 52 y.o., male MRN:  [id number] DOB:  February 13, 1961 Patient phone:  [phone] (home)  Patient address:   [address] [place] [address],   Date of Admission:  07/30/2012  Date of Discharge: 08/03/12  Reason for Admission:  Suicidal ideations with plans, Cocaine abuse, Alcohol dependence  Discharge Diagnoses: Principal Problem:  *Alcohol dependence Active Problems:  Cocaine dependence  Review of Systems  Constitutional: Negative.   HENT: Negative.   Eyes: Negative.   Respiratory: Negative.   Cardiovascular: Negative.   Gastrointestinal: Negative.   Genitourinary: Negative.   Musculoskeletal: Negative.   Skin: Negative.   Neurological: Negative.   Endo/Heme/Allergies: Negative.   Psychiatric/Behavioral: Positive for depression (Stabilized with medication prior to discharge), memory loss and substance abuse (Hx cocaine and alcohol abuse.). Negative for suicidal ideas and hallucinations. The patient is nervous/anxious (Stabilized with medication after discharge.). The patient does not have insomnia.    Axis Diagnosis:   AXIS I:  Cocaine dependence, alcohol dependence AXIS II:  Deferred AXIS III:   Past Medical History  Diagnosis Date  . Substance abuse   . A-fib   . Hypertension   . Arthritis   . COPD (chronic obstructive pulmonary disease)    AXIS IV:  other psychosocial or environmental problems and Substance abuse dependence AXIS V:  63  Level of Care:  [facility]  Hospital Course:  This is a 59 year old Caucasian male, admitted to [facility] from the [facility] ED with reports of suicidal ideations with plans to jump off of a bridge. Patient also indicated that he owned a pistol that he could use to hurt himself if he knew where it is. And Mr. Dessert reports during this assessment, "I was referred to the [facility] ED by the [facility]. I had asked for help for my alcoholism. I have  been abusing alcohol for a long time. I like the feel and effects of alcohol on me. I also use crack cocaine as much as I can get. I'm a binge crack smoker. Alcohol has done a number on me. In 2004, I spent 10 months in prison for drunk driving. Alcoholism has caused me a lot of relationships, jobs and I lost my home too. I am currently homeless. I was at the [facility] 3 days ago for help for my alcoholism, the assessment people did not even look at me. They turned me away. I have to admit that I have a problem. I have been through series of rehabilitation and treatment programs in the past, but I have not been able to quit this mess. The longest that I have been sober was 7 months. I have been at the [facility] treatment center numerous times. I am disabled, with a lot of heart problems. I take coumadin. It was prescribed for me by a doctor at the [facility], [facility] in [place], [place]. I have not taken any of my medicines in about 1 week. My situation depresses me because it is not getting any better".   After admission assessment/evaluation, it was determined that Mr. Rocca will need medication treatment/regimen to help combat and stabilize his current depressive mood symptoms. He was ordered and received Seroquel 25 mg for mood control, hydroxyzine 25 mg for anxiety and Wellbutrin XL 150 mg for depression. He was also enrolled in group counseling sessions and activities in which he participated actively on daily basis. As  his treatment regimen progressed, it was determined that patient is responding well to his treatment plan. This evidenced by his daily reports of improved mood, reduction of symptoms and presentation of good affects/eye contact.   Besides treatment for his depressive mood, Mr. Ditommaso also received medication management and monitoring for his other medical issues and concerns, including coumadin therapy for history of atrial fibrillation. His lab work (PT/INR) managed  by our pharmacist. He also received nebulizer breathing treatment for his SOB associated with COPD.  He tolerated his treatment regimen without any significant adverse effects and or reactions reported.  Patient attended treatment team meeting this a.m and met with treatment team members. His reason for admission, treatment plan, response to treatment and discharge plans discussed with patient. Mr. Voiles endorsed that his symptoms have improved. Pt also stated that he is stable for discharge to pursue the next phase of his psychiatric care.  It was agreed upon that patient will indeed need psychiatric care on outpatient basis to maintain stability. He will continue psychiatric care at the [facility] on 08/12/12 @ 08:00 am sharp. The address, date and time for this appointment provided for patient in writing.  As to what patient learned from being in this hospital, he reported that from this hospital stay he had learned to take care of himself by staying on his medications, report any possible adverse effects to his outpatient provider and keep his appointments as scheduled with his psychiatrist. In addition to his psychiatric care, patient is instructed and cautioned to follow-up with his primary care physician for his other health issues and his Cardiologist at the [facility] with [facility] in [place], [place] for his coumadin dosing and blood work within the next 2 days after his discharge from this hospital. He was cautioned and instructed to monitor himself for any signs of bleeding such as unusual bruising on the skin areas, and to use electric razor for shaving/grooming.  Upon discharge, patient adamantly denies suicidal, homicidal ideations, auditory, visual hallucinations and or delusional thinking. He left [facility] with all personal belongings via personal transportation in no apparent distress.  Consults:  None  Significant Diagnostic Studies:  labs: CBC with diff, cmp, UDS, Toxicology  tests.  Discharge Vitals:   Blood pressure 131/92, pulse 90, temperature 99.5 F (37.5 C), temperature source Oral, resp. rate 18, height 6' (1.829 m), weight 88.905 kg (196 lb), SpO2 97.00%. Body mass index is 26.58 kg/(m^2). Lab Results:   Results for orders placed during the hospital encounter of 07/30/12 (from the past 72 hour(s))  PROTIME-INR     Status: Normal   Collection Time   08/01/12  6:48 AM      Component Value Range Comment   Prothrombin Time 13.9  11.6 - 15.2 seconds    INR 1.08  0.00 - 1.49   PROTIME-INR     Status: Abnormal   Collection Time   08/02/12  6:42 AM      Component Value Range Comment   Prothrombin Time 15.3 (*) 11.6 - 15.2 seconds    INR 1.23  0.00 - 1.49   PROTIME-INR     Status: Abnormal   Collection Time   08/03/12  6:25 AM      Component Value Range Comment   Prothrombin Time 18.2 (*) 11.6 - 15.2 seconds    INR 1.56 (*) 0.00 - 1.49     Physical Findings: AIMS:  , ,  ,  ,    CIWA:  CIWA-Ar Total: 3  COWS:     Psychiatric Specialty Exam: See Psychiatric Specialty Exam and Suicide Risk Assessment completed by Attending Physician prior to discharge.  Discharge destination:  [facility], [facility]  Is patient on multiple antipsychotic therapies at discharge:  No   Has Patient had three or more failed trials of antipsychotic monotherapy by history:  No  Recommended Plan for Multiple Antipsychotic Therapies: NA     Medication List     As of 08/03/2012  3:59 PM    TAKE these medications      Indication    albuterol 108 (90 BASE) MCG/ACT inhaler   Commonly known as: PROVENTIL HFA;VENTOLIN HFA   Inhale 2 puffs into the lungs every 6 (six) hours as needed for wheezing or shortness of breath. For shortness of breath    Indication: Asthma      amiodarone 200 MG tablet   Commonly known as: PACERONE   Take 1 tablet (200 mg total) by mouth daily. For Atrial fibrillation/Hypertrophic Cardiomyopathy    Indication: Atrial Fibrillation, Hypertrophic Cardiomyopathy       buPROPion 150 MG 24 hr tablet   Commonly known as: WELLBUTRIN XL   Take 1 tablet (150 mg total) by mouth daily. For depression    Indication: Major Depressive Disorder      diltiazem 180 MG 24 hr capsule   Commonly known as: TIAZAC   Take 1 capsule (180 mg total) by mouth daily. For hypertension    Indication: Chronic Angina Following Exercise, Stress, or Excitement, High Blood Pressure      hydrOXYzine 25 MG tablet   Commonly known as: ATARAX/VISTARIL   Take 1 tablet (25 mg total) by mouth daily at 10 pm. For anxiety symptoms/sleep    Indication: Anxiety associated with Organic Disease, Insomnia      metoprolol 50 MG tablet   Commonly known as: LOPRESSOR   Take 1 tablet (50 mg total) by mouth 2 (two) times daily. For hypertension    Indication: High Blood Pressure      QUEtiapine 25 MG tablet   Commonly known as: SEROQUEL   Take 1 tablet (25 mg total) by mouth at bedtime. For sleep/mood control    Indication: Depressive Phase of Manic-Depression, Trouble Sleeping      warfarin 7.5 MG tablet   Commonly known as: COUMADIN   Take 1 tablet (7.5 mg total) by mouth daily. Take 7.5 mg daily x 2 days for atrial fibrillation, then follow-up with your cardiologist within the next 2 days.    Indication: Blood Clot in a Deep Vein, Blood Vessel Obstruction by a Blood Clot, A-fib         Follow-up Information    Follow up with [facility]. On 08/12/2012. (Be there by 3:30 PM today and you will be responsible for going to get testing done in 2 days and returning promptly )    Contact information:   [address] [place], [place]  [address] [facility] [phone] FAX [phone]         Follow-up recommendations:  Activity:  as tolerated Other:  Keep all scheduled follow-up appointments as recommended.    Comments:  Take all your medications as prescribed by your mental healthcare provider. Report any adverse effects and or reactions from your medicines to your outpatient provider  promptly. Patient is instructed and cautioned to not engage in alcohol and or illegal drug use while on prescription medicines. In the event of worsening symptoms, patient is instructed to call the crisis hotline, 911 and or go  to the nearest ED for appropriate evaluation and treatment of symptoms. Follow-up with your primary care provider for your other medical issues, concerns and or health care needs.     Total Discharge Time:  Greater than 30 minutes.  SignedArmandina Stammer I 08/03/2012, 3:59 PM

## 2012-08-03 NOTE — Progress Notes (Signed)
Adult Psychoeducational Group Note  Date:  08/03/2012 Time:  12:43 PM  Group Topic/Focus:  Self Care:   The focus of this group is to help patients understand the importance of self-care in order to improve or restore emotional, physical, spiritual, interpersonal, and financial health.  Participation Level:  None  Participation Quality:  Inattentive  Affect:  Irritable  Cognitive:  Appropriate  Insight: None  Engagement in Group:  None  Modes of Intervention:  Discussion, Education and Support  Additional Comments:  Pt reported to day room for group, but walked out after a short period of time without participating in group discussion.   Reinaldo Raddle K 08/03/2012, 12:43 PM

## 2012-08-03 NOTE — Progress Notes (Signed)
[facility] LCSW Aftercare Discharge Planning Group Note  08/03/2012   Participation Quality:  Resistant  Affect:  Anxious  Cognitive:  Alert and Oriented  Insight:  Engaged  Engagement in Group:  Limited  Modes of Intervention:  Confrontation, Problem-solving, Reality Testing and Support  Summary of Progress/Problems: Tamika came into group late and appeared irritable stating "I got to get out of here, this isn't helping me."   Patient reminded that he had stated on Friday that he wqanted inpatient and CSW needed to make contact with providedrs.  Patient demanding to "go at one PM wherever, I have to get out of here."  Clide Dales 08/03/2012,

## 2012-08-03 NOTE — Progress Notes (Addendum)
[facility] Adult Case Management Discharge Plan :  Will you be returning to the same living situation after discharge: No. patient going to [facility] and from there will access new housing situation, probably with friend in recovery At discharge, do you have transportation home?:Yes,  patient's truck at [employer] ED Do you have the ability to pay for your medications:Yes,    Release of information consent forms completed and in the chart;  Patient's signature needed at discharge.  Patient to Follow up at: Follow-up Information    Follow up with [facility]. On 08/03/2012. (Be there by 3:30 PM today and you will be responsible for going to get testing done in 2 days and returning promptly )    Contact information:   [address] [place], [place]  [address] [facility] [phone] Valinda Hoar [phone]         Patient denies SI/HI:   Yes,  patient states he never was suicidal "I just said that to get admitted."    Safety Planning and Suicide Prevention discussed:  Yes,  and patient has mobile crisis contact number  Clide Dales 08/03/2012, 3:20 PM

## 2012-08-03 NOTE — Progress Notes (Signed)
Patient ID: Donald [name], male   DOB: 07/07/1960, 52 y.o.   MRN: [id number] D)  Has been up and about this evening, asked for his inhaler as was feeling a little sob.  Affect is flat, sad and has said he is depressed over his condition and situation.  . Attended group, went back to dayroom afterward to watch the game.  When he came to the med window, pt refused seroquel as it makes him have a hungover feeling into the next day.  Agreed to take vistaril for sleep, then went back to the dayroom and watched tv.  Trying to learn to use relaxation techniques to help him fall asleep, and was given support.   A) Will continue to monitor for safety, give support and encouragement, continue POC R)  Safety maintained.

## 2012-08-03 NOTE — Progress Notes (Signed)
ANTICOAGULATION CONSULT NOTE - Follow Up Consult  Pharmacy Consult for Coumadin Indication: atrial fibrillation  No Known Allergies  Patient Measurements: Height: 6' (182.9 cm) Weight: 196 lb (88.905 kg) IBW/kg (Calculated) : 77.6    Vital Signs: Temp: 99.5 F (37.5 C) (02/03 0530) Temp src: Oral (02/03 0530) BP: 131/92 mmHg (02/03 0531) Pulse Rate: 90  (02/03 0531)  Labs:  Basename 08/03/12 0625 08/02/12 1610 08/01/12 0648  HGB -- -- --  HCT -- -- --  PLT -- -- --  APTT -- -- --  LABPROT 18.2* 15.3* 13.9  INR 1.56* 1.23 1.08  HEPARINUNFRC -- -- --  CREATININE -- -- --  CKTOTAL -- -- --  CKMB -- -- --  TROPONINI -- -- --    Estimated Creatinine Clearance: 85.2 ml/min (by C-G formula based on Cr of 1.1).   Medications:  Scheduled:    . amiodarone  200 mg Oral Daily  . buPROPion  150 mg Oral Daily  . diltiazem  180 mg Oral Daily  . hydrOXYzine  25 mg Oral Q2200  . metoprolol  50 mg Oral BID  . multivitamin with minerals  1 tablet Oral Daily  . nicotine  21 mg Transdermal Q0600  . QUEtiapine  25 mg Oral QHS  . [COMPLETED] warfarin  10 mg Oral ONCE-1800  . warfarin  7.5 mg Oral ONCE-1800  . Warfarin - Pharmacist Dosing Inpatient   Does not apply q1800    Assessment: INR below goal.  INR today 1.56 and increase from 1.23 on 08/02/12.  No complications with therapy noted.   Goal of Therapy:  INR 2-3    Plan:  Coumadin 7.5 mg today  PT/INR in am F/u in am   Charyl Dancer 08/03/2012,7:17 AM

## 2012-08-03 NOTE — Progress Notes (Signed)
Patient ID: Donald [name], male   DOB: 10-Sep-1960, 52 y.o.   MRN: [id number]

## 2012-08-03 NOTE — Progress Notes (Signed)
[facility] INPATIENT:  Family/Significant Other Suicide Prevention Education  Suicide Prevention Education:  Patient Refusal for Family/Significant Other Suicide Prevention Education: The patient BODIE ABERNETHY has refused to provide written consent for family/significant other to be provided Family/Significant Other Suicide Prevention Education during admission and/or prior to discharge.  Physician notified. Patient had refused Friday 1/31 and refused again today to sign consent. Patient reports "I'll be honest with you, I'll never kill myself, I just said that in order to get admitted."  CSW confirmed that patient does won a firearm (pistol) and it is located at his friend's home.  Patient made call to friend in CSW presence and stated to keep the gin in safe for remainder of month.  Writer satified with arrangement and provided suicide prevention education directly to patient; conversation included risk factors, warning signs and resources to contact for help. Mobile crisis services explained and contact information placed in chart for pt to receive at discharge.   Clide Dales 08/03/2012, 3:10 PM

## 2012-08-04 NOTE — Progress Notes (Signed)
Patient Discharge Instructions:  After Visit Summary (AVS):   Faxed to:  08/04/12 Discharge Summary Note:   Faxed to:  08/04/12 Psychiatric Admission Assessment Note:   Faxed to:  08/04/12 Suicide Risk Assessment - Discharge Assessment:   Faxed to:  08/04/12 Faxed/Sent to the Next Level Care provider:  08/04/12 Faxed to [facility] @ [phone]  Jerelene Redden, 08/04/2012, 3:23 PM

## 2012-08-04 NOTE — H&P (Signed)
Agree with assessment and plan Kensley Valladares A. Zarra Geffert, M.D. 

## 2012-08-07 NOTE — Discharge Summary (Signed)
Agree with assessment and plan Madox Corkins A. Imogine Carvell, M.D. 

## 2013-12-09 ENCOUNTER — Encounter (HOSPITAL_BASED_OUTPATIENT_CLINIC_OR_DEPARTMENT_OTHER): Payer: Self-pay | Admitting: Emergency Medicine

## 2013-12-09 ENCOUNTER — Emergency Department (HOSPITAL_BASED_OUTPATIENT_CLINIC_OR_DEPARTMENT_OTHER): Payer: No Typology Code available for payment source

## 2013-12-09 ENCOUNTER — Emergency Department (HOSPITAL_BASED_OUTPATIENT_CLINIC_OR_DEPARTMENT_OTHER)
Admission: EM | Admit: 2013-12-09 | Discharge: 2013-12-09 | Disposition: A | Discharge status: Home / Self Care | Payer: No Typology Code available for payment source | Attending: Emergency Medicine | Admitting: Emergency Medicine

## 2013-12-09 DIAGNOSIS — Z79899 Other long term (current) drug therapy: Secondary | ICD-10-CM | POA: Insufficient documentation

## 2013-12-09 DIAGNOSIS — Y92410 Unspecified street and highway as the place of occurrence of the external cause: Secondary | ICD-10-CM | POA: Diagnosis not present

## 2013-12-09 DIAGNOSIS — F17200 Nicotine dependence, unspecified, uncomplicated: Secondary | ICD-10-CM | POA: Insufficient documentation

## 2013-12-09 DIAGNOSIS — I10 Essential (primary) hypertension: Secondary | ICD-10-CM | POA: Insufficient documentation

## 2013-12-09 DIAGNOSIS — S46909A Unspecified injury of unspecified muscle, fascia and tendon at shoulder and upper arm level, unspecified arm, initial encounter: Secondary | ICD-10-CM | POA: Insufficient documentation

## 2013-12-09 DIAGNOSIS — IMO0002 Reserved for concepts with insufficient information to code with codable children: Secondary | ICD-10-CM | POA: Insufficient documentation

## 2013-12-09 DIAGNOSIS — J441 Chronic obstructive pulmonary disease with (acute) exacerbation: Secondary | ICD-10-CM

## 2013-12-09 DIAGNOSIS — I4891 Unspecified atrial fibrillation: Secondary | ICD-10-CM | POA: Insufficient documentation

## 2013-12-09 DIAGNOSIS — M25519 Pain in unspecified shoulder: Secondary | ICD-10-CM

## 2013-12-09 DIAGNOSIS — Z8739 Personal history of other diseases of the musculoskeletal system and connective tissue: Secondary | ICD-10-CM | POA: Diagnosis not present

## 2013-12-09 DIAGNOSIS — S7002XA Contusion of left hip, initial encounter: Secondary | ICD-10-CM

## 2013-12-09 DIAGNOSIS — Y9389 Activity, other specified: Secondary | ICD-10-CM | POA: Insufficient documentation

## 2013-12-09 DIAGNOSIS — S4980XA Other specified injuries of shoulder and upper arm, unspecified arm, initial encounter: Secondary | ICD-10-CM | POA: Diagnosis present

## 2013-12-09 DIAGNOSIS — Z7901 Long term (current) use of anticoagulants: Secondary | ICD-10-CM | POA: Diagnosis not present

## 2013-12-09 DIAGNOSIS — S7000XA Contusion of unspecified hip, initial encounter: Secondary | ICD-10-CM | POA: Diagnosis not present

## 2013-12-09 MED ORDER — PREDNISONE 50 MG PO TABS
60.0000 mg | ORAL_TABLET | Freq: Once | ORAL | Status: AC
  Administered 2013-12-09: 60 mg via ORAL
  Filled 2013-12-09 (×2): qty 1

## 2013-12-09 MED ORDER — AMOXICILLIN-POT CLAVULANATE 875-125 MG PO TABS: 1.0000 | ORAL_TABLET | Freq: Two times a day (BID) | ORAL | Status: DC

## 2013-12-09 MED ORDER — PREDNISONE 20 MG PO TABS: ORAL_TABLET | ORAL | Status: DC

## 2013-12-09 MED ORDER — ALBUTEROL SULFATE (2.5 MG/3ML) 0.083% IN NEBU
5.0000 mg | INHALATION_SOLUTION | Freq: Once | RESPIRATORY_TRACT | Status: AC
  Administered 2013-12-09: 5 mg via RESPIRATORY_TRACT
  Filled 2013-12-09: qty 6
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
## 2015-02-02 ENCOUNTER — Encounter (HOSPITAL_COMMUNITY): Payer: Self-pay

## 2015-02-02 ENCOUNTER — Inpatient Hospital Stay (HOSPITAL_COMMUNITY): Payer: Medicare HMO

## 2015-02-02 ENCOUNTER — Observation Stay (HOSPITAL_COMMUNITY)
Admission: EM | Admit: 2015-02-02 | Discharge: 2015-02-04 | Disposition: A | Discharge status: Home / Self Care | Payer: Medicare HMO | Attending: Internal Medicine | Admitting: Internal Medicine

## 2015-02-02 ENCOUNTER — Emergency Department (HOSPITAL_COMMUNITY): Payer: Medicare HMO

## 2015-02-02 DIAGNOSIS — J449 Chronic obstructive pulmonary disease, unspecified: Secondary | ICD-10-CM | POA: Diagnosis not present

## 2015-02-02 DIAGNOSIS — N179 Acute kidney failure, unspecified: Secondary | ICD-10-CM | POA: Diagnosis not present

## 2015-02-02 DIAGNOSIS — R778 Other specified abnormalities of plasma proteins: Secondary | ICD-10-CM | POA: Diagnosis present

## 2015-02-02 DIAGNOSIS — R079 Chest pain, unspecified: Secondary | ICD-10-CM | POA: Diagnosis present

## 2015-02-02 DIAGNOSIS — G9340 Encephalopathy, unspecified: Secondary | ICD-10-CM | POA: Diagnosis not present

## 2015-02-02 DIAGNOSIS — R748 Abnormal levels of other serum enzymes: Secondary | ICD-10-CM | POA: Insufficient documentation

## 2015-02-02 DIAGNOSIS — F1420 Cocaine dependence, uncomplicated: Secondary | ICD-10-CM | POA: Diagnosis not present

## 2015-02-02 DIAGNOSIS — R0602 Shortness of breath: Secondary | ICD-10-CM | POA: Diagnosis present

## 2015-02-02 DIAGNOSIS — Z7951 Long term (current) use of inhaled steroids: Secondary | ICD-10-CM | POA: Diagnosis not present

## 2015-02-02 DIAGNOSIS — F17210 Nicotine dependence, cigarettes, uncomplicated: Secondary | ICD-10-CM | POA: Diagnosis not present

## 2015-02-02 DIAGNOSIS — Z79899 Other long term (current) drug therapy: Secondary | ICD-10-CM | POA: Insufficient documentation

## 2015-02-02 DIAGNOSIS — I10 Essential (primary) hypertension: Secondary | ICD-10-CM | POA: Insufficient documentation

## 2015-02-02 DIAGNOSIS — M1990 Unspecified osteoarthritis, unspecified site: Secondary | ICD-10-CM | POA: Diagnosis not present

## 2015-02-02 DIAGNOSIS — I350 Nonrheumatic aortic (valve) stenosis: Secondary | ICD-10-CM

## 2015-02-02 DIAGNOSIS — I482 Chronic atrial fibrillation, unspecified: Secondary | ICD-10-CM | POA: Diagnosis present

## 2015-02-02 DIAGNOSIS — Z86711 Personal history of pulmonary embolism: Secondary | ICD-10-CM | POA: Diagnosis not present

## 2015-02-02 DIAGNOSIS — R7989 Other specified abnormal findings of blood chemistry: Secondary | ICD-10-CM | POA: Diagnosis present

## 2015-02-02 LAB — BASIC METABOLIC PANEL
Anion gap: 10 (ref 5–15)
BUN: 30 mg/dL — AB (ref 6–20)
CALCIUM: 8.6 mg/dL — AB (ref 8.9–10.3)
CHLORIDE: 107 mmol/L (ref 101–111)
CO2: 23 mmol/L (ref 22–32)
CREATININE: 1.38 mg/dL — AB (ref 0.61–1.24)
GFR calc non Af Amer: 56 mL/min — ABNORMAL LOW (ref >60)
Glucose, Bld: 119 mg/dL — ABNORMAL HIGH (ref 65–99)
POTASSIUM: 3.1 mmol/L — AB (ref 3.5–5.1)
SODIUM: 140 mmol/L (ref 135–145)

## 2015-02-02 LAB — ETHANOL: Alcohol, Ethyl (B): <5 mg/dL (ref <5)

## 2015-02-02 LAB — RAPID URINE DRUG SCREEN, HOSP PERFORMED
AMPHETAMINES: NOT DETECTED
Barbiturates: NOT DETECTED
Benzodiazepines: POSITIVE — AB
Cocaine: POSITIVE — AB
OPIATES: NOT DETECTED
TETRAHYDROCANNABINOL: NOT DETECTED

## 2015-02-02 LAB — PROTIME-INR
INR: 1.78 — ABNORMAL HIGH (ref 0.00–1.49)
Prothrombin Time: 20.7 seconds — ABNORMAL HIGH (ref 11.6–15.2)

## 2015-02-02 LAB — TROPONIN I
TROPONIN I: 0.18 ng/mL — AB (ref <0.031)
TROPONIN I: 0.18 ng/mL — AB (ref <0.031)

## 2015-02-02 LAB — BRAIN NATRIURETIC PEPTIDE: B Natriuretic Peptide: 112 pg/mL — ABNORMAL HIGH (ref 0.0–100.0)

## 2015-02-02 MED ORDER — SODIUM CHLORIDE 0.9 % IV BOLUS (SEPSIS)
1000.0000 mL | Freq: Once | INTRAVENOUS | Status: AC
  Administered 2015-02-02: 1000 mL via INTRAVENOUS

## 2015-02-02 MED ORDER — ASPIRIN 81 MG PO CHEW
324.0000 mg | CHEWABLE_TABLET | Freq: Once | ORAL | Status: AC
  Administered 2015-02-03: 324 mg via ORAL
  Filled 2015-02-02: qty 4

## 2015-02-02 MED ORDER — IOHEXOL 350 MG/ML SOLN
100.0000 mL | Freq: Once | INTRAVENOUS | Status: AC | PRN
  Administered 2015-02-02: 100 mL via INTRAVENOUS
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
## 2015-02-02 NOTE — ED Notes (Signed)
EDP did not want In and out cath.

## 2015-02-02 NOTE — ED Notes (Signed)
Bed: OZ30 Expected date:  Expected time:  Means of arrival:  Comments: Agitation, 5 haldol given

## 2015-02-02 NOTE — ED Notes (Signed)
Pt O2 sats 88% on room air; pt placed on O2 via [place] 2lpm

## 2015-02-02 NOTE — ED Provider Notes (Signed)
CSN: [id number]     Arrival date & time 02/02/15  1732 History   First MD Initiated Contact with Patient 02/02/15 1736     Chief Complaint  Patient presents with  . Shortness of Breath  . Agitation      HPI  Patient presents via EMS from a nearby restaurant. Called 911 with complaint of chest pain and shortness of breath. Was agitated with paramedics. He was given Haldol 5 mg, Versed 2 mg in route. Arrives here rather sedate without obvious complaint. Had admitted to paramedics he "smoked crack". He is able to tell me that he had a PE diagnosed a month ago. However he states was at [facility], and I have no record of this. States he does take Coumadin.   Past Medical History  Diagnosis Date  . Substance abuse   . A-fib   . Hypertension   . Arthritis   . COPD (chronic obstructive pulmonary disease)    Past Surgical History  Procedure Laterality Date  . Tonsillectomy     Family History  Problem Relation Age of Onset  . Family history unknown: Yes   History  Substance Use Topics  . Smoking status: Current Some Day Smoker -- 2.00 packs/day for 44 years    Types: Cigarettes  . Smokeless tobacco: Not on file  . Alcohol Use: Yes    Review of Systems  Unable to perform ROS: Other  Respiratory: Positive for shortness of breath.   Cardiovascular: Positive for chest pain.      Allergies  Review of patient's allergies indicates no known allergies.  Home Medications   Prior to Admission medications   Medication Sig Start Date End Date Taking? Authorizing Provider  budesonide-formoterol (SYMBICORT) 160-4.5 MCG/ACT inhaler Inhale 2 puffs into the lungs 2 (two) times daily.   Yes Historical Provider, MD  digoxin (LANOXIN) 0.25 MG tablet Take 0.25 mg by mouth daily.   Yes Historical Provider, MD  albuterol (PROVENTIL HFA;VENTOLIN HFA) 108 (90 BASE) MCG/ACT inhaler Inhale 2 puffs into the lungs every 6 (six) hours as needed for wheezing or shortness of breath. For shortness  of breath Patient not taking: Reported on 02/02/2015 08/03/12   Sanjuana Kava, NP  albuterol (PROVENTIL) (2.5 MG/3ML) 0.083% nebulizer solution Take 2.5 mg by nebulization every 6 (six) hours as needed for wheezing or shortness of breath.  02/01/15   Historical Provider, MD  amiodarone (PACERONE) 200 MG tablet Take 1 tablet (200 mg total) by mouth daily. For Atrial fibrillation/Hypertrophic Cardiomyopathy Patient not taking: Reported on 02/02/2015 08/03/12   Sanjuana Kava, NP  amoxicillin-clavulanate (AUGMENTIN) 875-125 MG per tablet Take 1 tablet by mouth 2 (two) times daily. Patient not taking: Reported on 02/02/2015 12/09/13   Margarita Grizzle, MD  buPROPion (WELLBUTRIN XL) 150 MG 24 hr tablet Take 1 tablet (150 mg total) by mouth daily. For depression Patient not taking: Reported on 02/02/2015 08/03/12   Sanjuana Kava, NP  COMBIVENT RESPIMAT 20-100 MCG/ACT AERS respimat Take 1 puff by mouth every 6 (six) hours as needed for wheezing or shortness of breath.  02/01/15   Historical Provider, MD  diltiazem (TIAZAC) 180 MG 24 hr capsule Take 1 capsule (180 mg total) by mouth daily. For hypertension Patient not taking: Reported on 02/02/2015 08/03/12   Sanjuana Kava, NP  hydrOXYzine (ATARAX/VISTARIL) 25 MG tablet Take 1 tablet (25 mg total) by mouth daily at 10 pm. For anxiety symptoms/sleep Patient not taking: Reported on 02/02/2015 08/03/12   Sanjuana Kava,  NP  metoprolol (LOPRESSOR) 50 MG tablet Take 1 tablet (50 mg total) by mouth 2 (two) times daily. For hypertension Patient not taking: Reported on 02/02/2015 08/03/12   Sanjuana Kava, NP  predniSONE (DELTASONE) 20 MG tablet 60 mg po qday for 5 days Patient not taking: Reported on 02/02/2015 12/09/13   Margarita Grizzle, MD  QUEtiapine (SEROQUEL) 25 MG tablet Take 1 tablet (25 mg total) by mouth at bedtime. For sleep/mood control Patient not taking: Reported on 02/02/2015 08/03/12   Sanjuana Kava, NP  warfarin (COUMADIN) 7.5 MG tablet Take 1 tablet (7.5 mg total) by mouth daily. Take 7.5 mg  daily x 2 days for atrial fibrillation, then follow-up with your cardiologist within the next 2 days. Patient not taking: Reported on 02/02/2015 08/03/12   Sanjuana Kava, NP  XARELTO 20 MG TABS tablet Take 20 mg by mouth daily with supper.  02/01/15   Historical Provider, MD   BP 163/104 mmHg  Pulse 94  Temp(Src) 97.9 F (36.6 C) (Rectal)  Resp 20  SpO2 90% Physical Exam  Constitutional: He is oriented to person, place, and time. He appears well-developed and well-nourished. He appears listless. No distress.  Sedate. Breathing spontaneously. Ultimately able answer simple questioning.  HENT:  Head: Normocephalic.  Eyes: Conjunctivae are normal. Pupils are equal, round, and reactive to light. No scleral icterus.  Neck: Normal range of motion. Neck supple. No thyromegaly present.  Cardiovascular: Normal rate and regular rhythm.  Exam reveals no gallop and no friction rub.   No murmur heard. Pulmonary/Chest: Effort normal and breath sounds normal. No respiratory distress. He has no wheezes. He has no rales.  Abdominal: Soft. Bowel sounds are normal. He exhibits no distension. There is no tenderness. There is no rebound.  Musculoskeletal: Normal range of motion.  Neurological: He is oriented to person, place, and time. He appears listless.  Skin: Skin is warm and dry. No rash noted.  Psychiatric: He has a normal mood and affect. His behavior is normal.    ED Course  Procedures (including critical care time) Labs Review Labs Reviewed  BASIC METABOLIC PANEL - Abnormal; Notable for the following:    Potassium 3.1 (*)    Glucose, Bld 119 (*)    BUN 30 (*)    Creatinine, Ser 1.38 (*)    Calcium 8.6 (*)    GFR calc non Af Amer 56 (*)    All other components within normal limits  TROPONIN I - Abnormal; Notable for the following:    Troponin I 0.18 (*)    All other components within normal limits  BRAIN NATRIURETIC PEPTIDE - Abnormal; Notable for the following:    B Natriuretic Peptide 112.0  (*)    All other components within normal limits  PROTIME-INR - Abnormal; Notable for the following:    Prothrombin Time 20.7 (*)    INR 1.78 (*)    All other components within normal limits  TROPONIN I - Abnormal; Notable for the following:    Troponin I 0.18 (*)    All other components within normal limits  ETHANOL  URINE RAPID DRUG SCREEN, HOSP PERFORMED  AMMONIA    Imaging Review Ct Angio Chest Pe W/cm &/or Wo Cm  02/02/2015   CLINICAL DATA:  Dyspnea and agitation.  EXAM: CT ANGIOGRAPHY CHEST WITH CONTRAST  TECHNIQUE: Multidetector CT imaging of the chest was performed using the standard protocol during bolus administration of intravenous contrast. Multiplanar CT image reconstructions and MIPs were obtained to evaluate  the vascular anatomy.  CONTRAST:  OMNIPAQUE IOHEXOL 350 MG/ML SOLN  COMPARISON:  02/26/2013, 11/24/2014  FINDINGS: Cardiovascular: There is good opacification of the pulmonary arteries. There is no pulmonary embolism. The thoracic aorta is normal in caliber and intact.  Lungs: There is a 2.2 cm circumscribed ovoid mass at the diaphragmatic surface of the right base. On coronal and sagittal reconstructions it appears to contain fat. This is unchanged from 02/26/2013 and most likely is benign. The lungs are otherwise clear.  Central airways: Patent  Effusions: None  Lymphadenopathy: None  Esophagus: Unremarkable  Upper abdomen: No significant abnormality no significant abnormality.  Musculoskeletal: None.  Review of the MIP images confirms the above findings.  IMPRESSION: 1. Negative for pulmonary embolus 2. No acute findings are evident in the chest. 3. Benign appearing 2.2 cm circumscribed pleural based mass of the right diaphragmatic surface, unchanged from 02/26/2013. It appears to contain fat. No specific follow-up is necessary.   Electronically Signed   By: Ellery Plunk M.D.   On: 02/02/2015 22:28   Dg Chest Port 1 View  02/02/2015   CLINICAL DATA:  Shortness of  breath  EXAM: PORTABLE CHEST - 1 VIEW  COMPARISON:  01/14/2015  FINDINGS: Diffuse interstitial opacity with cephalized blood flow. No interstitial edema or effusion. No collapse or consolidation. Normal heart size and mediastinal contours.  IMPRESSION: Pulmonary venous congestion.   Electronically Signed   By: Marnee Spring M.D.   On: 02/02/2015 18:28     EKG Interpretation   Date/Time:  Thursday February 02 2015 17:43:27 EDT Ventricular Rate:  94 PR Interval:  128 QRS Duration: 113 QT Interval:  395 QTC Calculation: 494 R Axis:   91 Text Interpretation:  Sinus rhythm Probable anteroseptal infarct, old  Confirmed by Fayrene Fearing  MD, Ocean Schildt ([id number]) on 02/02/2015 5:49:08 PM      MDM   Final diagnoses:  SOB (shortness of breath)  Right-sided chest pain    Stable vitals. Well oxygenated. Troponin 0.18. Recheck 0.18. CT is read shows normal aorta, no pulmonary embolus. EKG without acute changes. I discussed the case with Dr. Mahala Menghini,  He will admit the patient to stepdown unit for serial enzymes and rule out.      Rolland Porter, MD 02/02/15 [phone]

## 2015-02-03 ENCOUNTER — Encounter (HOSPITAL_COMMUNITY): Payer: Self-pay | Admitting: Internal Medicine

## 2015-02-03 ENCOUNTER — Observation Stay (HOSPITAL_BASED_OUTPATIENT_CLINIC_OR_DEPARTMENT_OTHER): Payer: Medicare HMO

## 2015-02-03 DIAGNOSIS — I350 Nonrheumatic aortic (valve) stenosis: Secondary | ICD-10-CM

## 2015-02-03 DIAGNOSIS — I482 Chronic atrial fibrillation, unspecified: Secondary | ICD-10-CM | POA: Diagnosis present

## 2015-02-03 DIAGNOSIS — Z86711 Personal history of pulmonary embolism: Secondary | ICD-10-CM | POA: Diagnosis not present

## 2015-02-03 DIAGNOSIS — R7989 Other specified abnormal findings of blood chemistry: Secondary | ICD-10-CM | POA: Diagnosis not present

## 2015-02-03 DIAGNOSIS — G9340 Encephalopathy, unspecified: Secondary | ICD-10-CM

## 2015-02-03 DIAGNOSIS — R0602 Shortness of breath: Secondary | ICD-10-CM

## 2015-02-03 DIAGNOSIS — R0600 Dyspnea, unspecified: Secondary | ICD-10-CM | POA: Diagnosis not present

## 2015-02-03 DIAGNOSIS — R778 Other specified abnormalities of plasma proteins: Secondary | ICD-10-CM | POA: Diagnosis present

## 2015-02-03 LAB — TROPONIN I
TROPONIN I: 0.16 ng/mL — AB (ref <0.031)
Troponin I: 0.2 ng/mL — ABNORMAL HIGH (ref <0.031)
Troponin I: 0.1 ng/mL — ABNORMAL HIGH (ref <0.031)

## 2015-02-03 LAB — CBC WITH DIFFERENTIAL/PLATELET
Basophils Absolute: 0 10*3/uL (ref 0.0–0.1)
Basophils Relative: 0 % (ref 0–1)
Eosinophils Absolute: 0.3 10*3/uL (ref 0.0–0.7)
Eosinophils Relative: 6 % — ABNORMAL HIGH (ref 0–5)
HEMATOCRIT: 41.2 % (ref 39.0–52.0)
HEMOGLOBIN: 13.6 g/dL (ref 13.0–17.0)
LYMPHS ABS: 1.6 10*3/uL (ref 0.7–4.0)
Lymphocytes Relative: 28 % (ref 12–46)
MCH: 29.3 pg (ref 26.0–34.0)
MCHC: 33 g/dL (ref 30.0–36.0)
MCV: 88.8 fL (ref 78.0–100.0)
MONOS PCT: 8 % (ref 3–12)
Monocytes Absolute: 0.5 10*3/uL (ref 0.1–1.0)
NEUTROS PCT: 58 % (ref 43–77)
Neutro Abs: 3.3 10*3/uL (ref 1.7–7.7)
Platelets: 155 10*3/uL (ref 150–400)
RBC: 4.64 MIL/uL (ref 4.22–5.81)
RDW: 14.5 % (ref 11.5–15.5)
WBC: 5.7 10*3/uL (ref 4.0–10.5)

## 2015-02-03 LAB — COMPREHENSIVE METABOLIC PANEL
ALT: 19 U/L (ref 17–63)
ANION GAP: 5 (ref 5–15)
AST: 25 U/L (ref 15–41)
Albumin: 3.6 g/dL (ref 3.5–5.0)
Alkaline Phosphatase: 73 U/L (ref 38–126)
BUN: 17 mg/dL (ref 6–20)
CALCIUM: 8.1 mg/dL — AB (ref 8.9–10.3)
CO2: 26 mmol/L (ref 22–32)
CREATININE: 1.07 mg/dL (ref 0.61–1.24)
Chloride: 107 mmol/L (ref 101–111)
GFR calc non Af Amer: >60 mL/min (ref >60)
GLUCOSE: 104 mg/dL — AB (ref 65–99)
Potassium: 3.1 mmol/L — ABNORMAL LOW (ref 3.5–5.1)
SODIUM: 138 mmol/L (ref 135–145)
TOTAL PROTEIN: 6.3 g/dL — AB (ref 6.5–8.1)
Total Bilirubin: 0.8 mg/dL (ref 0.3–1.2)

## 2015-02-03 LAB — AMMONIA: Ammonia: 26 umol/L (ref 9–35)

## 2015-02-03 LAB — DIGOXIN LEVEL

## 2015-02-03 LAB — MRSA PCR SCREENING: MRSA by PCR: POSITIVE — AB

## 2015-02-03 MED ORDER — ONDANSETRON HCL 4 MG PO TABS: 4.0000 mg | ORAL_TABLET | Freq: Four times a day (QID) | ORAL | Status: DC | PRN

## 2015-02-03 MED ORDER — THIAMINE HCL 100 MG/ML IJ SOLN: 100.0000 mg | Freq: Every day | INTRAMUSCULAR | Status: DC

## 2015-02-03 MED ORDER — FOLIC ACID 1 MG PO TABS
1.0000 mg | ORAL_TABLET | Freq: Every day | ORAL | Status: DC
  Administered 2015-02-03 – 2015-02-04 (×2): 1 mg via ORAL
  Filled 2015-02-03 (×2): qty 1

## 2015-02-03 MED ORDER — LORAZEPAM 2 MG/ML IJ SOLN: 1.0000 mg | Freq: Four times a day (QID) | INTRAMUSCULAR | Status: DC | PRN

## 2015-02-03 MED ORDER — ACETAMINOPHEN 650 MG RE SUPP: 650.0000 mg | Freq: Four times a day (QID) | RECTAL | Status: DC | PRN

## 2015-02-03 MED ORDER — ADULT MULTIVITAMIN W/MINERALS CH
1.0000 | ORAL_TABLET | Freq: Every day | ORAL | Status: DC
  Administered 2015-02-03 – 2015-02-04 (×2): 1 via ORAL
  Filled 2015-02-03 (×2): qty 1

## 2015-02-03 MED ORDER — SODIUM CHLORIDE 0.9 % IV SOLN
INTRAVENOUS | Status: DC
  Administered 2015-02-03: 10 mL via INTRAVENOUS

## 2015-02-03 MED ORDER — IPRATROPIUM-ALBUTEROL 20-100 MCG/ACT IN AERS: 1.0000 | INHALATION_SPRAY | Freq: Four times a day (QID) | RESPIRATORY_TRACT | Status: DC | PRN

## 2015-02-03 MED ORDER — RIVAROXABAN 20 MG PO TABS
20.0000 mg | ORAL_TABLET | Freq: Every day | ORAL | Status: DC
  Administered 2015-02-03: 20 mg via ORAL
  Filled 2015-02-03: qty 1

## 2015-02-03 MED ORDER — LORAZEPAM 1 MG PO TABS
1.0000 mg | ORAL_TABLET | Freq: Four times a day (QID) | ORAL | Status: DC | PRN
  Administered 2015-02-03: 1 mg via ORAL
  Filled 2015-02-03: qty 1

## 2015-02-03 MED ORDER — THIAMINE HCL 100 MG/ML IJ SOLN
100.0000 mg | Freq: Every day | INTRAMUSCULAR | Status: DC
Start: 2015-02-03 — End: 2015-02-03
  Administered 2015-02-03: 100 mg via INTRAVENOUS
  Filled 2015-02-03: qty 2

## 2015-02-03 MED ORDER — DIGOXIN 125 MCG PO TABS
0.2500 mg | ORAL_TABLET | Freq: Every day | ORAL | Status: DC
  Administered 2015-02-03 – 2015-02-04 (×2): 0.25 mg via ORAL
  Filled 2015-02-03 (×2): qty 2

## 2015-02-03 MED ORDER — NITROGLYCERIN 0.4 MG SL SUBL: 0.4000 mg | SUBLINGUAL_TABLET | SUBLINGUAL | Status: DC | PRN

## 2015-02-03 MED ORDER — ASPIRIN EC 81 MG PO TBEC
81.0000 mg | DELAYED_RELEASE_TABLET | Freq: Every day | ORAL | Status: DC
  Administered 2015-02-03 – 2015-02-04 (×2): 81 mg via ORAL
  Filled 2015-02-03 (×2): qty 1

## 2015-02-03 MED ORDER — VITAMIN B-1 100 MG PO TABS
100.0000 mg | ORAL_TABLET | Freq: Every day | ORAL | Status: DC
  Administered 2015-02-03 – 2015-02-04 (×2): 100 mg via ORAL
  Filled 2015-02-03 (×2): qty 1

## 2015-02-03 MED ORDER — ACETAMINOPHEN 325 MG PO TABS: 650.0000 mg | ORAL_TABLET | Freq: Four times a day (QID) | ORAL | Status: DC | PRN

## 2015-02-03 MED ORDER — ALBUTEROL SULFATE (2.5 MG/3ML) 0.083% IN NEBU
2.5000 mg | INHALATION_SOLUTION | Freq: Four times a day (QID) | RESPIRATORY_TRACT | Status: DC | PRN
  Administered 2015-02-03 (×2): 2.5 mg via RESPIRATORY_TRACT
  Filled 2015-02-03 (×2): qty 3

## 2015-02-03 MED ORDER — BUDESONIDE-FORMOTEROL FUMARATE 160-4.5 MCG/ACT IN AERO
2.0000 | INHALATION_SPRAY | Freq: Two times a day (BID) | RESPIRATORY_TRACT | Status: DC
  Administered 2015-02-03 – 2015-02-04 (×2): 2 via RESPIRATORY_TRACT
  Filled 2015-02-03 (×2): qty 6

## 2015-02-03 MED ORDER — ONDANSETRON HCL 4 MG/2ML IJ SOLN: 4.0000 mg | Freq: Four times a day (QID) | INTRAMUSCULAR | Status: DC | PRN

## 2015-02-03 MED ORDER — POTASSIUM CHLORIDE CRYS ER 20 MEQ PO TBCR
40.0000 meq | EXTENDED_RELEASE_TABLET | Freq: Two times a day (BID) | ORAL | Status: AC
  Administered 2015-02-03 (×2): 40 meq via ORAL
  Filled 2015-02-03 (×2): qty 2

## 2015-02-03 NOTE — H&P (Addendum)
[facility] History and Physical  Donald [name] ZHY:[id number] DOB: [birth year] DOA: 02/02/2015  Referring physician: Dr. Fayrene Fearing. PCP: Pcp Not In System [facility] system. Specialists: [facility] system.  Chief Complaint:  Shortness of breath and confusion.  History of Donald [name] physician and care everywhere in [place]. Patient is drowsy from sedation.  HPI: Donald [name] is a 54 y.o. male  with history of aortic stenosis, pulmonary embolism, polysubstance abuse, COPD and chronic atrial fibrillation was brought to the [name] after patient complained of shortness of breath at a bar. Patient also had taken cocaine as per the reports. After EMS reached the place patient was found to be agitated and was given Haldol and Versed. Patient was sedated. Patient's labs showed mildly elevated troponin with EKG showing nonspecific findings. Since patient had history of pulmonary embolism CT angiogram of the chest was done which was negative for PE. On exam patient is still drowsy but denies any chest pain and is not short of breath, abdomen appears benign, and has no neck rigidity and moves all extremities. Patient is drowsy and is unable to give full history. Full history is to be obtained of the patient becomes more alert and awake.   Review of Systems: As presented in the history of presenting illness, rest negative.  Past Medical History  Diagnosis Date  . Substance abuse   . A-fib   . Hypertension   . Arthritis   . COPD (chronic obstructive pulmonary disease)    Past Surgical History  Procedure Laterality Date  . Tonsillectomy     Social History:  reports that he has been smoking Cigarettes.  He has a 88 pack-year smoking history. He does not have any smokeless tobacco history on file. He reports that he drinks alcohol. He reports that he uses illicit drugs (Cocaine) about 7 times per week. Where does patient live  home. Can patient participate in ADLs?  Yes.  No Known Allergies  Family History:   Family History  Problem Relation Age of Onset  . Family history unknown: Yes      Prior to Admission medications   Medication Sig Start Date End Date Taking? Authorizing Provider  budesonide-formoterol (SYMBICORT) 160-4.5 MCG/ACT inhaler Inhale 2 puffs into the lungs 2 (two) times daily.   Yes Historical Provider, MD  digoxin (LANOXIN) 0.25 MG tablet Take 0.25 mg by mouth daily.   Yes Historical Provider, MD  albuterol (PROVENTIL HFA;VENTOLIN HFA) 108 (90 BASE) MCG/ACT inhaler Inhale 2 puffs into the lungs every 6 (six) hours as needed for wheezing or shortness of breath. For shortness of breath Patient not taking: Reported on 02/02/2015 08/03/12   Sanjuana Kava, NP  albuterol (PROVENTIL) (2.5 MG/3ML) 0.083% nebulizer solution Take 2.5 mg by nebulization every 6 (six) hours as needed for wheezing or shortness of breath.  02/01/15   Historical Provider, MD  amiodarone (PACERONE) 200 MG tablet Take 1 tablet (200 mg total) by mouth daily. For Atrial fibrillation/Hypertrophic Cardiomyopathy Patient not taking: Reported on 02/02/2015 08/03/12   Sanjuana Kava, NP  amoxicillin-clavulanate (AUGMENTIN) 875-125 MG per tablet Take 1 tablet by mouth 2 (two) times daily. Patient not taking: Reported on 02/02/2015 12/09/13   Margarita Grizzle, MD  buPROPion (WELLBUTRIN XL) 150 MG 24 hr tablet Take 1 tablet (150 mg total) by mouth daily. For depression Patient not taking: Reported on 02/02/2015 08/03/12   Sanjuana Kava, NP  COMBIVENT RESPIMAT 20-100 MCG/ACT AERS respimat Take 1 puff by mouth every 6 (six) hours as needed  for wheezing or shortness of breath.  02/01/15   Historical Provider, MD  diltiazem (TIAZAC) 180 MG 24 hr capsule Take 1 capsule (180 mg total) by mouth daily. For hypertension Patient not taking: Reported on 02/02/2015 08/03/12   Sanjuana Kava, NP  hydrOXYzine (ATARAX/VISTARIL) 25 MG tablet Take 1 tablet (25 mg total) by mouth daily at 10 pm. For anxiety symptoms/sleep Patient not taking: Reported on 02/02/2015 08/03/12    Sanjuana Kava, NP  metoprolol (LOPRESSOR) 50 MG tablet Take 1 tablet (50 mg total) by mouth 2 (two) times daily. For hypertension Patient not taking: Reported on 02/02/2015 08/03/12   Sanjuana Kava, NP  predniSONE (DELTASONE) 20 MG tablet 60 mg po qday for 5 days Patient not taking: Reported on 02/02/2015 12/09/13   Margarita Grizzle, MD  QUEtiapine (SEROQUEL) 25 MG tablet Take 1 tablet (25 mg total) by mouth at bedtime. For sleep/mood control Patient not taking: Reported on 02/02/2015 08/03/12   Sanjuana Kava, NP  warfarin (COUMADIN) 7.5 MG tablet Take 1 tablet (7.5 mg total) by mouth daily. Take 7.5 mg daily x 2 days for atrial fibrillation, then follow-up with your cardiologist within the next 2 days. Patient not taking: Reported on 02/02/2015 08/03/12   Sanjuana Kava, NP  XARELTO 20 MG TABS tablet Take 20 mg by mouth daily with supper.  02/01/15   Historical Provider, MD    Physical Exam: Filed Vitals:   02/02/15 2239 02/02/15 2255 02/02/15 2300 02/02/15 2330  BP: 163/104  132/95 159/94  Pulse: 92  90 81  Temp:      TempSrc:      Resp: 17  18 16   SpO2: 90% 92% 97% 97%     General:  Moderately built and nourished.  Eyes:  Anicteric no pallor.  ENT:  No discharge from the ears eyes nose and mouth.  Neck:  No mass felt. No neck rigidity.  Cardiovascular:  S1-S2 heard.  Respiratory:  No rhonchi or crepitations.  Abdomen:  Soft nontender bowel sounds present.  Skin:  No rash.  Musculoskeletal:  No edema.  Psychiatric:  Patient is drowsy.  Neurologic:  Patient is drowsy and moves all extremities.  Labs on Admission:  Basic Metabolic Panel:  Recent Labs Lab 02/02/15 1758  NA 140  K 3.1*  CL 107  CO2 23  GLUCOSE 119*  BUN 30*  CREATININE 1.38*  CALCIUM 8.6*   Liver Function Tests: No results for input(s): AST, ALT, ALKPHOS, BILITOT, PROT, ALBUMIN in the last 168 hours. No results for input(s): LIPASE, AMYLASE in the last 168 hours. No results for input(s): AMMONIA in the last  168 hours. CBC: No results for input(s): WBC, NEUTROABS, HGB, HCT, MCV, PLT in the last 168 hours. Cardiac Enzymes:  Recent Labs Lab 02/02/15 1758 02/02/15 2110  TROPONINI 0.18* 0.18*    BNP (last 3 results)  Recent Labs  02/02/15 1758  BNP 112.0*    ProBNP (last 3 results) No results for input(s): PROBNP in the last 8760 hours.  CBG: No results for input(s): GLUCAP in the last 168 hours.  Radiological Exams on Admission: Ct Head Wo Contrast  02/03/2015   CLINICAL DATA:  Acute onset of shortness of breath and agitation. Acute encephalopathy. Recently smoked crack. Initial encounter.  EXAM: CT HEAD WITHOUT CONTRAST  TECHNIQUE: Contiguous axial images were obtained from the base of the skull through the vertex without intravenous contrast.  COMPARISON:  CT of the head performed 10/19/2013  FINDINGS: There is no  evidence of acute infarction, mass lesion, or intra- or extra-axial hemorrhage on CT.  The posterior fossa, including the cerebellum, brainstem and fourth ventricle, is within normal limits. The third and lateral ventricles, and basal ganglia are unremarkable in appearance. The cerebral hemispheres are symmetric in appearance, with normal gray-white differentiation. No mass effect or midline shift is seen.  There is no evidence of fracture; visualized osseous structures are unremarkable in appearance. The orbits are within normal limits. The paranasal sinuses and mastoid air cells are well-aerated. No significant soft tissue abnormalities are seen.  IMPRESSION: Unremarkable noncontrast CT of the head.   Electronically Signed   By: Roanna Raider M.D.   On: 02/03/2015 00:16   Ct Angio Chest Pe W/cm &/or Wo Cm  02/02/2015   CLINICAL DATA:  Dyspnea and agitation.  EXAM: CT ANGIOGRAPHY CHEST WITH CONTRAST  TECHNIQUE: Multidetector CT imaging of the chest was performed using the standard protocol during bolus administration of intravenous contrast. Multiplanar CT image reconstructions  and MIPs were obtained to evaluate the vascular anatomy.  CONTRAST:  OMNIPAQUE IOHEXOL 350 MG/ML SOLN  COMPARISON:  02/26/2013, 11/24/2014  FINDINGS: Cardiovascular: There is good opacification of the pulmonary arteries. There is no pulmonary embolism. The thoracic aorta is normal in caliber and intact.  Lungs: There is a 2.2 cm circumscribed ovoid mass at the diaphragmatic surface of the right base. On coronal and sagittal reconstructions it appears to contain fat. This is unchanged from 02/26/2013 and most likely is benign. The lungs are otherwise clear.  Central airways: Patent  Effusions: None  Lymphadenopathy: None  Esophagus: Unremarkable  Upper abdomen: No significant abnormality no significant abnormality.  Musculoskeletal: None.  Review of the MIP images confirms the above findings.  IMPRESSION: 1. Negative for pulmonary embolus 2. No acute findings are evident in the chest. 3. Benign appearing 2.2 cm circumscribed pleural based mass of the right diaphragmatic surface, unchanged from 02/26/2013. It appears to contain fat. No specific follow-up is necessary.   Electronically Signed   By: Ellery Plunk M.D.   On: 02/02/2015 22:28   Dg Chest Port 1 View  02/02/2015   CLINICAL DATA:  Shortness of breath  EXAM: PORTABLE CHEST - 1 VIEW  COMPARISON:  01/14/2015  FINDINGS: Diffuse interstitial opacity with cephalized blood flow. No interstitial edema or effusion. No collapse or consolidation. Normal heart size and mediastinal contours.  IMPRESSION: Pulmonary venous congestion.   Electronically Signed   By: Marnee Spring M.D.   On: 02/02/2015 18:28    EKG: Independently reviewed.  Normal sinus rhythm with poor R wave progression.   Assessment/Plan Principal Problem:   Acute encephalopathy Active Problems:   Cocaine dependence   Chest pain   SOB (shortness of breath)   Elevated troponin   History of pulmonary embolism   Chronic atrial fibrillation   Aortic stenosis  1. Acute  encephalopathy - probably medication related and also patient having consumed cocaine. CT head is negative for anything acute at this time. Closely observe. Ammonia is within acceptable limits. I placed patient on when necessary Ativan for any agitation. And thiamine. 2. Elevated troponin with history of cocaine abuse and aortic stenosis - cycle cardiac markers. Check 2-D echo. Patient is on xarelto. No beta blockers secondary to cocaine abuse. Baby aspirin. When necessary nitroglycerin. 3. Cocaine abuse - will need counseling when patient is alert and awake. 4. Chronic atrial fibrillation presently in sinus rhythm - patient is on xarelto. Patient is also on digoxin as per the  medication list. Check digoxin levels. Closely observe. 5. History of pulmonary embolism on xarelto. Present CT scan does not show any acute pulmonary embolism. 6. Acute renal failure - follow UA and metabolic panel. May be related to dehydration. 7. COPD presently not wheezing.  I have reviewed patient's old charts on labs. Personally reviewed patient's chest x-ray and EKG.  Patient is a difficult stick. CBC and LFTs are pending.   DVT Prophylaxis xarelto.  Code Status: Full code.  Family Communication: Patient is drowsy and no family available at this time.  Disposition Plan: Admit for observation.    KAKRAKANDY,ARSHAD N. [facility] Pager [phone].  If 7PM-7AM, please contact night-coverage [email or web address] Password [email or web address] 02/03/2015, 12:28 AM

## 2015-02-03 NOTE — Progress Notes (Signed)
[facility] night coverage notified of patient's increase in troponin value from 0.18 to 0.20, no new order received. Pt currently in bed resting, denied chest pain and is in no acute distress.

## 2015-02-03 NOTE — Progress Notes (Signed)
Pt was transferred upstairs via wheelchair on telemetry. Pt oriented X4, all VS stable, pt not in any acute distress. All belongings transferred upstairs with chart including key to belongings sent to security, cooler, and phone.

## 2015-02-03 NOTE — Progress Notes (Signed)
*  PRELIMINARY RESULTS* Echocardiogram 2D Echocardiogram has been performed.  Jeryl Columbia 02/03/2015, 3:12 PM

## 2015-02-03 NOTE — Progress Notes (Signed)
[facility] PROGRESS NOTE  Assessment/Plan: Acute encephalopathy:  - probably substance induce. - Ct head negative, UDS positive for BZD, he relates he did cocaine. - A&O x3 but sleepy.  Elevated troponin: - EKG no abnormal ST changes Trops decreasing probably demand ischemia. - no chest pain or SOB.  History of pulmonary embolism - cont xarelto  Chronic atrial fibrillation - cont xarelto, rate controlled.   Cocaine dependence - cont BZD's.    Code Status: full Family Communication: none  Disposition Plan: inpatient   Consultants:  none  Procedures:  CT head  Antibiotics: none HPI/Subjective: No complains he is sleepy  Objective: Filed Vitals:   02/03/15 0356 02/03/15 0400 02/03/15 0500 02/03/15 0600  BP:  162/92 169/83 134/80  Pulse:  83 83 85  Temp: 98.8 F (37.1 C)     TempSrc: Oral     Resp:  Height:      Weight:      SpO2:  96% 98% 98%    Intake/Output Summary (Last 24 hours) at 02/03/15 0815 Last data filed at 02/03/15 0200  Gross per 24 hour  Intake    500 ml  Output      0 ml  Net    500 ml   Filed Weights   02/03/15 0053  Weight: 90.6 kg (199 lb 11.8 oz)    Exam:  General: Alert, awake, oriented x3, in no acute distress.  sleepy HEENT: No bruits, no goiter.  Heart: Regular rate and rhythm. Lungs: Good air movement, clear Abdomen: Soft, nontender, nondistended, positive bowel sounds.  Neuro: Grossly intact, nonfocal.   Data Reviewed: Basic Metabolic Panel:  Recent Labs Lab 02/02/15 1758 02/03/15 0630  NA 140 138  K 3.1* 3.1*  CL 107 107  CO2 23 26  GLUCOSE 119* 104*  BUN 30* 17  CREATININE 1.38* 1.07  CALCIUM 8.6* 8.1*   Liver Function Tests:  Recent Labs Lab 02/03/15 0630  AST 25  ALT 19  ALKPHOS 73  BILITOT 0.8  PROT 6.3*  ALBUMIN 3.6   No results for input(s): LIPASE, AMYLASE in the last 168 hours.  Recent Labs Lab 02/03/15 0050  AMMONIA 26   CBC:  Recent Labs Lab  02/03/15 0630  WBC 5.7  NEUTROABS 3.3  HGB 13.6  HCT 41.2  MCV 88.8  PLT 155   Cardiac Enzymes:  Recent Labs Lab 02/02/15 1758 02/02/15 2110 02/03/15 0050 02/03/15 0630  TROPONINI 0.18* 0.18* 0.20* 0.16*   BNP (last 3 results)  Recent Labs  02/02/15 1758  BNP 112.0*    ProBNP (last 3 results) No results for input(s): PROBNP in the last 8760 hours.  CBG: No results for input(s): GLUCAP in the last 168 hours.  No results found for this or any previous visit (from the past 240 hour(s)).   Studies: Ct Head Wo Contrast  02/03/2015   CLINICAL DATA:  Acute onset of shortness of breath and agitation. Acute encephalopathy. Recently smoked crack. Initial encounter.  EXAM: CT HEAD WITHOUT CONTRAST  TECHNIQUE: Contiguous axial images were obtained from the base of the skull through the vertex without intravenous contrast.  COMPARISON:  CT of the head performed 10/19/2013  FINDINGS: There is no evidence of acute infarction, mass lesion, or intra- or extra-axial hemorrhage on CT.  The posterior fossa, including the cerebellum, brainstem and fourth ventricle, is within normal limits. The third and lateral ventricles, and basal ganglia are unremarkable in appearance. The cerebral hemispheres are symmetric in appearance,  with normal gray-white differentiation. No mass effect or midline shift is seen.  There is no evidence of fracture; visualized osseous structures are unremarkable in appearance. The orbits are within normal limits. The paranasal sinuses and mastoid air cells are well-aerated. No significant soft tissue abnormalities are seen.  IMPRESSION: Unremarkable noncontrast CT of the head.   Electronically Signed   By: Roanna Raider M.D.   On: 02/03/2015 00:16   Ct Angio Chest Pe W/cm &/or Wo Cm  02/02/2015   CLINICAL DATA:  Dyspnea and agitation.  EXAM: CT ANGIOGRAPHY CHEST WITH CONTRAST  TECHNIQUE: Multidetector CT imaging of the chest was performed using the standard protocol during  bolus administration of intravenous contrast. Multiplanar CT image reconstructions and MIPs were obtained to evaluate the vascular anatomy.  CONTRAST:  OMNIPAQUE IOHEXOL 350 MG/ML SOLN  COMPARISON:  02/26/2013, 11/24/2014  FINDINGS: Cardiovascular: There is good opacification of the pulmonary arteries. There is no pulmonary embolism. The thoracic aorta is normal in caliber and intact.  Lungs: There is a 2.2 cm circumscribed ovoid mass at the diaphragmatic surface of the right base. On coronal and sagittal reconstructions it appears to contain fat. This is unchanged from 02/26/2013 and most likely is benign. The lungs are otherwise clear.  Central airways: Patent  Effusions: None  Lymphadenopathy: None  Esophagus: Unremarkable  Upper abdomen: No significant abnormality no significant abnormality.  Musculoskeletal: None.  Review of the MIP images confirms the above findings.  IMPRESSION: 1. Negative for pulmonary embolus 2. No acute findings are evident in the chest. 3. Benign appearing 2.2 cm circumscribed pleural based mass of the right diaphragmatic surface, unchanged from 02/26/2013. It appears to contain fat. No specific follow-up is necessary.   Electronically Signed   By: Ellery Plunk M.D.   On: 02/02/2015 22:28   Dg Chest Port 1 View  02/02/2015   CLINICAL DATA:  Shortness of breath  EXAM: PORTABLE CHEST - 1 VIEW  COMPARISON:  01/14/2015  FINDINGS: Diffuse interstitial opacity with cephalized blood flow. No interstitial edema or effusion. No collapse or consolidation. Normal heart size and mediastinal contours.  IMPRESSION: Pulmonary venous congestion.   Electronically Signed   By: Marnee Spring M.D.   On: 02/02/2015 18:28    Scheduled Meds: . aspirin EC  81 mg Oral Daily  . budesonide-formoterol  2 puff Inhalation BID  . digoxin  0.25 mg Oral Daily  . folic acid  1 mg Oral Daily  . multivitamin with minerals  1 tablet Oral Daily  . potassium chloride  40 mEq Oral BID  . rivaroxaban   20 mg Oral Q supper  . thiamine IV  100 mg Intravenous Daily  . thiamine  100 mg Oral Daily   Or  . thiamine  100 mg Intravenous Daily   Continuous Infusions: . sodium chloride 10 mL (02/03/15 0050)    Time Spent: 25 min   Marinda Elk  [facility] Pager [phone]. If 7PM-7AM, please contact night-coverage at [email or web address], password [facility] 02/03/2015, 8:15 AM  LOS: 1 day

## 2015-02-04 DIAGNOSIS — I482 Chronic atrial fibrillation: Secondary | ICD-10-CM

## 2015-02-04 DIAGNOSIS — G9340 Encephalopathy, unspecified: Secondary | ICD-10-CM | POA: Diagnosis not present

## 2015-02-04 DIAGNOSIS — R7989 Other specified abnormal findings of blood chemistry: Secondary | ICD-10-CM

## 2015-02-04 DIAGNOSIS — F1420 Cocaine dependence, uncomplicated: Secondary | ICD-10-CM

## 2015-02-04 DIAGNOSIS — I350 Nonrheumatic aortic (valve) stenosis: Secondary | ICD-10-CM | POA: Diagnosis not present

## 2015-02-04 LAB — BASIC METABOLIC PANEL
Anion gap: 8 (ref 5–15)
BUN: 9 mg/dL (ref 6–20)
CO2: 27 mmol/L (ref 22–32)
Calcium: 8.9 mg/dL (ref 8.9–10.3)
Chloride: 104 mmol/L (ref 101–111)
Creatinine, Ser: 0.92 mg/dL (ref 0.61–1.24)
GFR calc Af Amer: >60 mL/min (ref >60)
GFR calc non Af Amer: >60 mL/min (ref >60)
Glucose, Bld: 108 mg/dL — ABNORMAL HIGH (ref 65–99)
Potassium: 3.5 mmol/L (ref 3.5–5.1)
SODIUM: 139 mmol/L (ref 135–145)

## 2015-02-04 LAB — URINALYSIS, ROUTINE W REFLEX MICROSCOPIC
Bilirubin Urine: NEGATIVE
GLUCOSE, UA: NEGATIVE mg/dL
Hgb urine dipstick: NEGATIVE
KETONES UR: NEGATIVE mg/dL
Leukocytes, UA: NEGATIVE
Nitrite: NEGATIVE
Protein, ur: NEGATIVE mg/dL
Specific Gravity, Urine: 1.007 (ref 1.005–1.030)
Urobilinogen, UA: 1 mg/dL (ref 0.0–1.0)
pH: 6.5 (ref 5.0–8.0)

## 2015-02-04 MED ORDER — AMIODARONE HCL 200 MG PO TABS
200.0000 mg | ORAL_TABLET | Freq: Every day | ORAL | Status: DC
  Administered 2015-02-04: 200 mg via ORAL
  Filled 2015-02-04: qty 1

## 2015-02-04 MED ORDER — METOPROLOL TARTRATE 50 MG PO TABS
50.0000 mg | ORAL_TABLET | Freq: Two times a day (BID) | ORAL | Status: DC
  Administered 2015-02-04: 50 mg via ORAL
  Filled 2015-02-04: qty 1

## 2015-02-04 MED ORDER — MUPIROCIN 2 % EX OINT
1.0000 "application " | TOPICAL_OINTMENT | Freq: Two times a day (BID) | CUTANEOUS | Status: DC
  Administered 2015-02-04: 1 via NASAL
  Filled 2015-02-04: qty 22

## 2015-02-04 MED ORDER — DILTIAZEM HCL ER BEADS 180 MG PO CP24: 180.0000 mg | ORAL_CAPSULE | Freq: Every day | ORAL | Status: DC

## 2015-02-04 MED ORDER — DILTIAZEM HCL ER COATED BEADS 180 MG PO CP24
180.0000 mg | ORAL_CAPSULE | Freq: Every day | ORAL | Status: DC
  Administered 2015-02-04: 180 mg via ORAL
  Filled 2015-02-04: qty 1

## 2015-02-04 MED ORDER — CHLORHEXIDINE GLUCONATE CLOTH 2 % EX PADS
6.0000 | MEDICATED_PAD | Freq: Every day | CUTANEOUS | Status: DC
  Administered 2015-02-04: 6 via TOPICAL

## 2015-02-04 MED ORDER — VITAMINS A & D EX OINT
TOPICAL_OINTMENT | CUTANEOUS | Status: AC
  Administered 2015-02-04: 10:00:00
  Filled 2015-02-04: qty 5

## 2015-02-04 MED ORDER — BUPROPION HCL ER (XL) 150 MG PO TB24
150.0000 mg | ORAL_TABLET | Freq: Every day | ORAL | Status: DC
  Administered 2015-02-04: 150 mg via ORAL
  Filled 2015-02-04: qty 1

## 2015-02-04 MED ORDER — QUETIAPINE FUMARATE 25 MG PO TABS: 25.0000 mg | ORAL_TABLET | Freq: Every day | ORAL | Status: DC

## 2015-02-04 MED ORDER — GUAIFENESIN ER 600 MG PO TB12
600.0000 mg | ORAL_TABLET | Freq: Two times a day (BID) | ORAL | Status: DC | PRN
  Administered 2015-02-04: 600 mg via ORAL
  Filled 2015-02-04: qty 1

## 2015-02-04 NOTE — Care Management Note (Signed)
Case Management Note  Patient Details  Name: KIMBERLY COYE MRN: [id number] Date of Birth: Nov 20, 1960  Subjective/Objective:      Acute encephalopathy              Action/Plan: Home   Expected Discharge Date:  02/04/2015               Expected Discharge Plan:  Home/Self Care  In-House Referral:  Clinical Social Worker  Discharge planning Services  CM Consult    Status of Service:  Completed, signed off  Medicare Important Message Given:    Date Medicare IM Given:    Medicare IM give by:    Date Additional Medicare IM Given:    Additional Medicare Important Message give by:     If discussed at Long Length of Stay Meetings, dates discussed:    Additional Comments: Pt states he lives in [place] but is not going back because he owes a lot of people money. Provided pt with list of providers that accept his insurance. States he is not sure where he will live. NCM explained the importance of follow up with primary care physician. States he receives a disability the third of each month but spent all his finances with getting his truck fixed and drugs. He has $40 left until his next disability check comes in September.  Elliot Cousin, RN 02/04/2015, 3:36 PM
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
## 2015-02-04 NOTE — Discharge Summary (Addendum)
Physician Discharge Summary  Donald [name] AVW:[id number] DOB: [birth year] DOA: 02/02/2015  PCP: Pcp Not In System  Admit date: 02/02/2015 Discharge date: 02/04/2015  Time spent: 35 minutes  Recommendations for Outpatient Follow-up:  1. Counseling is a follow-up with rehabilitation as an outpatient. 2. Medical follow-up with the [facility].  Discharge Diagnoses:  Principal Problem:   Acute encephalopathy Active Problems:   Cocaine dependence   Chest pain   SOB (shortness of breath)   Elevated troponin   History of pulmonary embolism   Chronic atrial fibrillation   Aortic stenosis   Discharge Condition: steble  Diet recommendation: regular  Filed Weights   02/03/15 0053  Weight: 90.6 kg (199 lb 11.8 oz)    History of present illness:  54 year old male with past medical history of aortic stenosis, pulmonary embolism polysubstance abuse atrial fibrillation on Xarelto that comes in after overdosing unintentionally with multiple substances including crack and alcohol admitted to the step down for confusion.  Hospital Course:  Acute encephalopathy: A CT scan of the history no acute findings is probably polysubstance abuse, his UDS was positive for benzos and he admits using crack cocaine. Resolved after 23 hours observation.  Elevated troponins: EKG showed a normal ST segment changes his troponin trended down probably demand ischemia. He denies any shortness of breath or chest pain.  History of pulmonary embolism continue Xarelto.  Chronic atrial fibrillation: These were held on admission his diltiazem, metoprolol and amiodarone resumed on the day of discharge along with Vistaril to continue as an outpatient.   Cocaine dependence: Counseling.   Procedures:  CT head no acute findings  Consultations:  none  Discharge Exam: Filed Vitals:   02/04/15 0605  BP: 143/91  Pulse: 98  Temp: 98.1 F (36.7 C)  Resp: 16    General: A&O x3 Cardiovascular:  RRR Respiratory: good air movement CTA B/L  Discharge Instructions   Discharge Instructions    Diet - low sodium heart healthy    Complete by:  As directed      Increase activity slowly    Complete by:  As directed           Current Discharge Medication List    CONTINUE these medications which have NOT CHANGED   Details  budesonide-formoterol (SYMBICORT) 160-4.5 MCG/ACT inhaler Inhale 2 puffs into the lungs 2 (two) times daily.    digoxin (LANOXIN) 0.25 MG tablet Take 0.25 mg by mouth daily.    albuterol (PROVENTIL) (2.5 MG/3ML) 0.083% nebulizer solution Take 2.5 mg by nebulization every 6 (six) hours as needed for wheezing or shortness of breath.     amiodarone (PACERONE) 200 MG tablet Take 1 tablet (200 mg total) by mouth daily. For Atrial fibrillation/Hypertrophic Cardiomyopathy    COMBIVENT RESPIMAT 20-100 MCG/ACT AERS respimat Take 1 puff by mouth every 6 (six) hours as needed for wheezing or shortness of breath.     metoprolol (LOPRESSOR) 50 MG tablet Take 1 tablet (50 mg total) by mouth 2 (two) times daily. For hypertension    XARELTO 20 MG TABS tablet Take 20 mg by mouth daily with supper.       STOP taking these medications     albuterol (PROVENTIL HFA;VENTOLIN HFA) 108 (90 BASE) MCG/ACT inhaler      amoxicillin-clavulanate (AUGMENTIN) 875-125 MG per tablet      buPROPion (WELLBUTRIN XL) 150 MG 24 hr tablet      diltiazem (TIAZAC) 180 MG 24 hr capsule      hydrOXYzine (ATARAX/VISTARIL) 25 MG  tablet      predniSONE (DELTASONE) 20 MG tablet      QUEtiapine (SEROQUEL) 25 MG tablet      warfarin (COUMADIN) 7.5 MG tablet        No Known Allergies    The results of significant diagnostics from this hospitalization (including imaging, microbiology, ancillary and laboratory) are listed below for reference.    Significant Diagnostic Studies: Ct Head Wo Contrast  02/03/2015   CLINICAL DATA:  Acute onset of shortness of breath and agitation. Acute  encephalopathy. Recently smoked crack. Initial encounter.  EXAM: CT HEAD WITHOUT CONTRAST  TECHNIQUE: Contiguous axial images were obtained from the base of the skull through the vertex without intravenous contrast.  COMPARISON:  CT of the head performed 10/19/2013  FINDINGS: There is no evidence of acute infarction, mass lesion, or intra- or extra-axial hemorrhage on CT.  The posterior fossa, including the cerebellum, brainstem and fourth ventricle, is within normal limits. The third and lateral ventricles, and basal ganglia are unremarkable in appearance. The cerebral hemispheres are symmetric in appearance, with normal gray-white differentiation. No mass effect or midline shift is seen.  There is no evidence of fracture; visualized osseous structures are unremarkable in appearance. The orbits are within normal limits. The paranasal sinuses and mastoid air cells are well-aerated. No significant soft tissue abnormalities are seen.  IMPRESSION: Unremarkable noncontrast CT of the head.   Electronically Signed   By: Roanna Raider M.D.   On: 02/03/2015 00:16   Ct Angio Chest Pe W/cm &/or Wo Cm  02/02/2015   CLINICAL DATA:  Dyspnea and agitation.  EXAM: CT ANGIOGRAPHY CHEST WITH CONTRAST  TECHNIQUE: Multidetector CT imaging of the chest was performed using the standard protocol during bolus administration of intravenous contrast. Multiplanar CT image reconstructions and MIPs were obtained to evaluate the vascular anatomy.  CONTRAST:  OMNIPAQUE IOHEXOL 350 MG/ML SOLN  COMPARISON:  02/26/2013, 11/24/2014  FINDINGS: Cardiovascular: There is good opacification of the pulmonary arteries. There is no pulmonary embolism. The thoracic aorta is normal in caliber and intact.  Lungs: There is a 2.2 cm circumscribed ovoid mass at the diaphragmatic surface of the right base. On coronal and sagittal reconstructions it appears to contain fat. This is unchanged from 02/26/2013 and most likely is benign. The lungs are otherwise  clear.  Central airways: Patent  Effusions: None  Lymphadenopathy: None  Esophagus: Unremarkable  Upper abdomen: No significant abnormality no significant abnormality.  Musculoskeletal: None.  Review of the MIP images confirms the above findings.  IMPRESSION: 1. Negative for pulmonary embolus 2. No acute findings are evident in the chest. 3. Benign appearing 2.2 cm circumscribed pleural based mass of the right diaphragmatic surface, unchanged from 02/26/2013. It appears to contain fat. No specific follow-up is necessary.   Electronically Signed   By: Ellery Plunk M.D.   On: 02/02/2015 22:28   Dg Chest Port 1 View  02/02/2015   CLINICAL DATA:  Shortness of breath  EXAM: PORTABLE CHEST - 1 VIEW  COMPARISON:  01/14/2015  FINDINGS: Diffuse interstitial opacity with cephalized blood flow. No interstitial edema or effusion. No collapse or consolidation. Normal heart size and mediastinal contours.  IMPRESSION: Pulmonary venous congestion.   Electronically Signed   By: Marnee Spring M.D.   On: 02/02/2015 18:28    Microbiology: Recent Results (from the past 240 hour(s))  MRSA PCR Screening     Status: Abnormal   Collection Time: 02/03/15 12:31 AM  Result Value Ref Range Status  MRSA by PCR POSITIVE (A) NEGATIVE Final    Comment:        The GeneXpert MRSA Assay (FDA approved for NASAL specimens only), is one component of a comprehensive MRSA colonization surveillance program. It is not intended to diagnose MRSA infection nor to guide or monitor treatment for MRSA infections. RESULT CALLED TO, READ BACK BY AND VERIFIED WITH: CMarland Kitchen CREECH RN 9:45 02/03/15 ([email or web address]) Performed at [facility]      Labs: Basic Metabolic Panel:  Recent Labs Lab 02/02/15 1758 02/03/15 0630 02/04/15 0515  NA 140 138 139  K 3.1* 3.1* 3.5  CL 107 107 104  CO2 23 26 27   GLUCOSE 119* 104* 108*  BUN 30* 17 9  CREATININE 1.38* 1.07 0.92  CALCIUM 8.6* 8.1* 8.9   Liver Function Tests:  Recent Labs Lab  02/03/15 0630  AST 25  ALT 19  ALKPHOS 73  BILITOT 0.8  PROT 6.3*  ALBUMIN 3.6   No results for input(s): LIPASE, AMYLASE in the last 168 hours.  Recent Labs Lab 02/03/15 0050  AMMONIA 26   CBC:  Recent Labs Lab 02/03/15 0630  WBC 5.7  NEUTROABS 3.3  HGB 13.6  HCT 41.2  MCV 88.8  PLT 155   Cardiac Enzymes:  Recent Labs Lab 02/02/15 1758 02/02/15 2110 02/03/15 0050 02/03/15 0630 02/03/15 1215  TROPONINI 0.18* 0.18* 0.20* 0.16* 0.10*   BNP: BNP (last 3 results)  Recent Labs  02/02/15 1758  BNP 112.0*    ProBNP (last 3 results) No results for input(s): PROBNP in the last 8760 hours.  CBG: No results for input(s): GLUCAP in the last 168 hours.     Signed:  Marinda Elk  [facility] 02/04/2015, 12:42 PM

## 2015-02-27 ENCOUNTER — Encounter (HOSPITAL_COMMUNITY): Payer: Self-pay | Admitting: Emergency Medicine

## 2015-02-27 ENCOUNTER — Observation Stay (HOSPITAL_COMMUNITY): Payer: Medicare HMO

## 2015-02-27 ENCOUNTER — Emergency Department (HOSPITAL_COMMUNITY): Payer: Medicare HMO

## 2015-02-27 ENCOUNTER — Observation Stay (HOSPITAL_COMMUNITY)
Admission: EM | Admit: 2015-02-27 | Discharge: 2015-03-03 | Disposition: A | Discharge status: Home / Self Care | Payer: Medicare HMO | Attending: Internal Medicine | Admitting: Internal Medicine

## 2015-02-27 DIAGNOSIS — I639 Cerebral infarction, unspecified: Secondary | ICD-10-CM

## 2015-02-27 DIAGNOSIS — I482 Chronic atrial fibrillation, unspecified: Secondary | ICD-10-CM | POA: Diagnosis present

## 2015-02-27 DIAGNOSIS — Z590 Homelessness: Secondary | ICD-10-CM | POA: Insufficient documentation

## 2015-02-27 DIAGNOSIS — F1410 Cocaine abuse, uncomplicated: Secondary | ICD-10-CM | POA: Insufficient documentation

## 2015-02-27 DIAGNOSIS — Z7901 Long term (current) use of anticoagulants: Secondary | ICD-10-CM | POA: Diagnosis not present

## 2015-02-27 DIAGNOSIS — T45516A Underdosing of anticoagulants, initial encounter: Secondary | ICD-10-CM | POA: Insufficient documentation

## 2015-02-27 DIAGNOSIS — R4781 Slurred speech: Secondary | ICD-10-CM | POA: Insufficient documentation

## 2015-02-27 DIAGNOSIS — J441 Chronic obstructive pulmonary disease with (acute) exacerbation: Secondary | ICD-10-CM | POA: Insufficient documentation

## 2015-02-27 DIAGNOSIS — I6789 Other cerebrovascular disease: Secondary | ICD-10-CM

## 2015-02-27 DIAGNOSIS — R7989 Other specified abnormal findings of blood chemistry: Secondary | ICD-10-CM

## 2015-02-27 DIAGNOSIS — Z23 Encounter for immunization: Secondary | ICD-10-CM | POA: Diagnosis not present

## 2015-02-27 DIAGNOSIS — I214 Non-ST elevation (NSTEMI) myocardial infarction: Secondary | ICD-10-CM | POA: Diagnosis present

## 2015-02-27 DIAGNOSIS — M1611 Unilateral primary osteoarthritis, right hip: Secondary | ICD-10-CM | POA: Insufficient documentation

## 2015-02-27 DIAGNOSIS — M129 Arthropathy, unspecified: Secondary | ICD-10-CM | POA: Diagnosis not present

## 2015-02-27 DIAGNOSIS — F17210 Nicotine dependence, cigarettes, uncomplicated: Secondary | ICD-10-CM | POA: Diagnosis not present

## 2015-02-27 DIAGNOSIS — I248 Other forms of acute ischemic heart disease: Secondary | ICD-10-CM | POA: Insufficient documentation

## 2015-02-27 DIAGNOSIS — I350 Nonrheumatic aortic (valve) stenosis: Secondary | ICD-10-CM | POA: Diagnosis not present

## 2015-02-27 DIAGNOSIS — G451 Carotid artery syndrome (hemispheric): Secondary | ICD-10-CM | POA: Diagnosis not present

## 2015-02-27 DIAGNOSIS — G459 Transient cerebral ischemic attack, unspecified: Secondary | ICD-10-CM | POA: Diagnosis not present

## 2015-02-27 DIAGNOSIS — Z9114 Patient's other noncompliance with medication regimen: Secondary | ICD-10-CM

## 2015-02-27 DIAGNOSIS — I10 Essential (primary) hypertension: Secondary | ICD-10-CM | POA: Diagnosis not present

## 2015-02-27 DIAGNOSIS — K029 Dental caries, unspecified: Secondary | ICD-10-CM

## 2015-02-27 DIAGNOSIS — R0602 Shortness of breath: Secondary | ICD-10-CM | POA: Diagnosis present

## 2015-02-27 DIAGNOSIS — R778 Other specified abnormalities of plasma proteins: Secondary | ICD-10-CM

## 2015-02-27 DIAGNOSIS — Z720 Tobacco use: Secondary | ICD-10-CM | POA: Diagnosis present

## 2015-02-27 DIAGNOSIS — F1910 Other psychoactive substance abuse, uncomplicated: Secondary | ICD-10-CM | POA: Diagnosis not present

## 2015-02-27 DIAGNOSIS — I252 Old myocardial infarction: Secondary | ICD-10-CM | POA: Diagnosis not present

## 2015-02-27 DIAGNOSIS — Z86711 Personal history of pulmonary embolism: Secondary | ICD-10-CM | POA: Insufficient documentation

## 2015-02-27 DIAGNOSIS — R200 Anesthesia of skin: Secondary | ICD-10-CM | POA: Diagnosis not present

## 2015-02-27 DIAGNOSIS — M1990 Unspecified osteoarthritis, unspecified site: Secondary | ICD-10-CM | POA: Diagnosis present

## 2015-02-27 HISTORY — DX: Cerebral infarction, unspecified: I63.9

## 2015-02-27 LAB — I-STAT TROPONIN, ED: Troponin i, poc: 0.29 ng/mL (ref 0.00–0.08)

## 2015-02-27 LAB — COMPREHENSIVE METABOLIC PANEL
ALBUMIN: 3.7 g/dL (ref 3.5–5.0)
ALK PHOS: 84 U/L (ref 38–126)
ALT: 15 U/L — AB (ref 17–63)
ANION GAP: 5 (ref 5–15)
AST: 16 U/L (ref 15–41)
BUN: 17 mg/dL (ref 6–20)
CALCIUM: 9.4 mg/dL (ref 8.9–10.3)
CHLORIDE: 106 mmol/L (ref 101–111)
CO2: 29 mmol/L (ref 22–32)
CREATININE: 1.27 mg/dL — AB (ref 0.61–1.24)
GFR calc Af Amer: >60 mL/min (ref >60)
GFR calc non Af Amer: >60 mL/min (ref >60)
GLUCOSE: 106 mg/dL — AB (ref 65–99)
Potassium: 4.3 mmol/L (ref 3.5–5.1)
SODIUM: 140 mmol/L (ref 135–145)
Total Bilirubin: 0.9 mg/dL (ref 0.3–1.2)
Total Protein: 6.7 g/dL (ref 6.5–8.1)

## 2015-02-27 LAB — CBC
HEMATOCRIT: 49 % (ref 39.0–52.0)
HEMOGLOBIN: 16.4 g/dL (ref 13.0–17.0)
MCH: 29.7 pg (ref 26.0–34.0)
MCHC: 33.5 g/dL (ref 30.0–36.0)
MCV: 88.6 fL (ref 78.0–100.0)
Platelets: 188 10*3/uL (ref 150–400)
RBC: 5.53 MIL/uL (ref 4.22–5.81)
RDW: 14.5 % (ref 11.5–15.5)
WBC: 7.7 10*3/uL (ref 4.0–10.5)

## 2015-02-27 LAB — URINALYSIS, ROUTINE W REFLEX MICROSCOPIC
Bilirubin Urine: NEGATIVE
GLUCOSE, UA: NEGATIVE mg/dL
HGB URINE DIPSTICK: NEGATIVE
Ketones, ur: NEGATIVE mg/dL
Leukocytes, UA: NEGATIVE
Nitrite: NEGATIVE
PH: 5.5 (ref 5.0–8.0)
PROTEIN: NEGATIVE mg/dL
Specific Gravity, Urine: 1.019 (ref 1.005–1.030)
Urobilinogen, UA: 0.2 mg/dL (ref 0.0–1.0)

## 2015-02-27 LAB — RAPID URINE DRUG SCREEN, HOSP PERFORMED
AMPHETAMINES: NOT DETECTED
BENZODIAZEPINES: NOT DETECTED
Barbiturates: NOT DETECTED
COCAINE: NOT DETECTED
Opiates: NOT DETECTED
Tetrahydrocannabinol: NOT DETECTED

## 2015-02-27 LAB — DIFFERENTIAL
Basophils Absolute: 0.1 10*3/uL (ref 0.0–0.1)
Basophils Relative: 1 % (ref 0–1)
Eosinophils Absolute: 0.3 10*3/uL (ref 0.0–0.7)
Eosinophils Relative: 4 % (ref 0–5)
LYMPHS PCT: 27 % (ref 12–46)
Lymphs Abs: 2.1 10*3/uL (ref 0.7–4.0)
Monocytes Absolute: 0.6 10*3/uL (ref 0.1–1.0)
Monocytes Relative: 8 % (ref 3–12)
NEUTROS ABS: 4.7 10*3/uL (ref 1.7–7.7)
NEUTROS PCT: 60 % (ref 43–77)

## 2015-02-27 LAB — TROPONIN I
TROPONIN I: 0.18 ng/mL — AB (ref <0.031)
Troponin I: 0.23 ng/mL — ABNORMAL HIGH (ref <0.031)
Troponin I: 0.1 ng/mL — ABNORMAL HIGH (ref <0.031)

## 2015-02-27 LAB — HEPARIN LEVEL (UNFRACTIONATED)

## 2015-02-27 LAB — DIGOXIN LEVEL: Digoxin Level: <0.2 ng/mL — ABNORMAL LOW (ref 0.8–2.0)

## 2015-02-27 LAB — PROTIME-INR
INR: 1.06 (ref 0.00–1.49)
PROTHROMBIN TIME: 14 s (ref 11.6–15.2)

## 2015-02-27 LAB — APTT: APTT: 33 s (ref 24–37)

## 2015-02-27 LAB — ETHANOL: Alcohol, Ethyl (B): <5 mg/dL (ref <5)

## 2015-02-27 LAB — VITAMIN B12: Vitamin B-12: 243 pg/mL (ref 180–914)

## 2015-02-27 LAB — TSH: TSH: 0.477 u[IU]/mL (ref 0.350–4.500)

## 2015-02-27 MED ORDER — ASPIRIN 81 MG PO CHEW
81.0000 mg | CHEWABLE_TABLET | ORAL | Status: AC
  Administered 2015-02-28: 81 mg via ORAL
  Filled 2015-02-27: qty 1

## 2015-02-27 MED ORDER — ACETAMINOPHEN 650 MG RE SUPP: 650.0000 mg | RECTAL | Status: DC | PRN

## 2015-02-27 MED ORDER — SODIUM CHLORIDE 0.9 % WEIGHT BASED INFUSION
1.0000 mL/kg/h | INTRAVENOUS | Status: DC
  Administered 2015-02-28: 1 mL/kg/h via INTRAVENOUS

## 2015-02-27 MED ORDER — IPRATROPIUM BROMIDE 0.02 % IN SOLN
0.5000 mg | Freq: Four times a day (QID) | RESPIRATORY_TRACT | Status: DC
  Administered 2015-02-27 (×2): 0.5 mg via RESPIRATORY_TRACT
  Filled 2015-02-27 (×2): qty 2.5

## 2015-02-27 MED ORDER — GUAIFENESIN ER 600 MG PO TB12
600.0000 mg | ORAL_TABLET | Freq: Two times a day (BID) | ORAL | Status: DC
  Administered 2015-02-27 – 2015-03-03 (×8): 600 mg via ORAL
  Filled 2015-02-27 (×8): qty 1

## 2015-02-27 MED ORDER — PREDNISONE 20 MG PO TABS
40.0000 mg | ORAL_TABLET | Freq: Two times a day (BID) | ORAL | Status: DC
  Administered 2015-02-27 – 2015-02-28 (×2): 40 mg via ORAL
  Filled 2015-02-27 (×2): qty 2

## 2015-02-27 MED ORDER — SODIUM CHLORIDE 0.9 % WEIGHT BASED INFUSION
3.0000 mL/kg/h | INTRAVENOUS | Status: DC
Start: 2015-02-28 — End: 2015-02-28
  Administered 2015-02-28: 3 mL/kg/h via INTRAVENOUS

## 2015-02-27 MED ORDER — DIGOXIN 250 MCG PO TABS: 0.2500 mg | ORAL_TABLET | Freq: Every day | ORAL | Status: DC

## 2015-02-27 MED ORDER — ASPIRIN 81 MG PO CHEW
324.0000 mg | CHEWABLE_TABLET | Freq: Once | ORAL | Status: AC
  Administered 2015-02-27: 324 mg via ORAL
  Filled 2015-02-27: qty 4

## 2015-02-27 MED ORDER — AMIODARONE HCL 200 MG PO TABS
200.0000 mg | ORAL_TABLET | Freq: Every day | ORAL | Status: DC
  Administered 2015-02-27 – 2015-03-03 (×5): 200 mg via ORAL
  Filled 2015-02-27 (×5): qty 1

## 2015-02-27 MED ORDER — DOXYCYCLINE HYCLATE 100 MG PO TABS
100.0000 mg | ORAL_TABLET | Freq: Two times a day (BID) | ORAL | Status: DC
  Administered 2015-02-27 – 2015-02-28 (×2): 100 mg via ORAL
  Filled 2015-02-27 (×2): qty 1

## 2015-02-27 MED ORDER — ACETAMINOPHEN 325 MG PO TABS
650.0000 mg | ORAL_TABLET | ORAL | Status: DC | PRN
  Administered 2015-03-01: 650 mg via ORAL
  Filled 2015-02-27: qty 2

## 2015-02-27 MED ORDER — HEPARIN (PORCINE) IN NACL 100-0.45 UNIT/ML-% IJ SOLN
1600.0000 [IU]/h | INTRAMUSCULAR | Status: DC
  Administered 2015-02-27: 1100 [IU]/h via INTRAVENOUS
  Filled 2015-02-27 (×2): qty 250

## 2015-02-27 MED ORDER — NICOTINE 14 MG/24HR TD PT24
14.0000 mg | MEDICATED_PATCH | Freq: Every day | TRANSDERMAL | Status: DC
  Administered 2015-02-27 – 2015-03-02 (×5): 14 mg via TRANSDERMAL
  Filled 2015-02-27 (×6): qty 1

## 2015-02-27 MED ORDER — ZOLPIDEM TARTRATE 5 MG PO TABS
5.0000 mg | ORAL_TABLET | Freq: Once | ORAL | Status: AC
  Administered 2015-02-27: 5 mg via ORAL
  Filled 2015-02-27: qty 1

## 2015-02-27 MED ORDER — SODIUM CHLORIDE 0.9 % IJ SOLN
3.0000 mL | INTRAMUSCULAR | Status: DC | PRN
Start: 2015-02-27 — End: 2015-02-28

## 2015-02-27 MED ORDER — SODIUM CHLORIDE 0.9 % IV SOLN: 250.0000 mL | INTRAVENOUS | Status: DC | PRN

## 2015-02-27 MED ORDER — ATORVASTATIN CALCIUM 80 MG PO TABS
80.0000 mg | ORAL_TABLET | Freq: Every day | ORAL | Status: DC
  Administered 2015-02-28 – 2015-03-03 (×4): 80 mg via ORAL
  Filled 2015-02-27 (×4): qty 1

## 2015-02-27 MED ORDER — BUDESONIDE 0.25 MG/2ML IN SUSP
0.2500 mg | Freq: Two times a day (BID) | RESPIRATORY_TRACT | Status: DC
  Administered 2015-02-27 – 2015-03-03 (×7): 0.25 mg via RESPIRATORY_TRACT
  Filled 2015-02-27 (×8): qty 2

## 2015-02-27 MED ORDER — ALBUTEROL SULFATE (2.5 MG/3ML) 0.083% IN NEBU
2.5000 mg | INHALATION_SOLUTION | Freq: Four times a day (QID) | RESPIRATORY_TRACT | Status: DC
  Administered 2015-02-27 (×2): 2.5 mg via RESPIRATORY_TRACT
  Filled 2015-02-27 (×2): qty 3

## 2015-02-27 MED ORDER — RIVAROXABAN 20 MG PO TABS: 20.0000 mg | ORAL_TABLET | Freq: Every day | ORAL | Status: DC

## 2015-02-27 MED ORDER — INFLUENZA VAC SPLIT QUAD 0.5 ML IM SUSY
0.5000 mL | PREFILLED_SYRINGE | INTRAMUSCULAR | Status: DC
Start: 2015-02-28 — End: 2015-03-03
  Filled 2015-02-27: qty 0.5

## 2015-02-27 MED ORDER — STROKE: EARLY STAGES OF RECOVERY BOOK
Freq: Once | Status: AC
  Administered 2015-02-27: 16:00:00
  Filled 2015-02-27: qty 1

## 2015-02-27 MED ORDER — ATORVASTATIN CALCIUM 80 MG PO TABS
80.0000 mg | ORAL_TABLET | ORAL | Status: AC
  Administered 2015-02-27: 80 mg via ORAL
  Filled 2015-02-27: qty 1

## 2015-02-27 MED ORDER — SODIUM CHLORIDE 0.9 % IJ SOLN
3.0000 mL | Freq: Two times a day (BID) | INTRAMUSCULAR | Status: DC
  Administered 2015-02-28: 3 mL via INTRAVENOUS

## 2015-02-27 NOTE — Progress Notes (Signed)
ANTICOAGULATION CONSULT NOTE - Follow-up Consult  Pharmacy Consult for heparin Indication: chest pain/ACS  No Known Allergies  Patient Measurements: Height:  (180.3 cm) Weight: 206 lb 14.4 oz (93.849 kg) IBW/kg (Calculated) : 75.3 Heparin Dosing Weight: 93.8 kg  Vital Signs: Temp: 97.5 F (36.4 C) (08/29 2108) Temp Source: Oral (08/29 2108) BP: 147/94 mmHg (08/29 2108) Pulse Rate: 82 (08/29 2108)  Labs:  Recent Labs  02/27/15 1044 02/27/15 1634 02/27/15 2221  HGB 16.4  --   --   HCT 49.0  --   --   PLT 188  --   --   APTT 33  --   --   LABPROT 14.0  --   --   INR 1.06  --   --   HEPARINUNFRC  --   --  <0.10*  CREATININE 1.27*  --   --   TROPONINI 0.23* 0.18*  --     Estimated Creatinine Clearance: 76 mL/min (by C-G formula based on Cr of 1.27).   Assessment: 54 yo male on Xarelto pta for AFib, pt with admitted poor compliance to Xarelto. Admitted with probable TIA, did not receive tpa. Pt also had elevated troponins, with known history of poly-substance abuse. He will undergo cardiac cath. Heparin started for r/o ACS. Heparin level undetectable on 1100 units/hr. No issues with line or bleeding reported per RN.  Goal of Therapy:  Heparin level 0.3-0.7 units/ml Monitor platelets by anticoagulation protocol: Yes   Plan:  Increase heparin to 1500 units/hr, hold heparin bolus due to TIA F/u 6 hr heparin level with a.m. labs  Christoper Fabian, PharmD, BCPS Clinical pharmacist, pager [phone] 02/27/2015 11:08 PM

## 2015-02-27 NOTE — ED Notes (Signed)
Mri called and informed pt ready, states to take pt upstairs and transport will be sent when table is available.

## 2015-02-27 NOTE — Progress Notes (Signed)
ANTICOAGULATION CONSULT NOTE - Initial Consult  Pharmacy Consult for heparin Indication: chest pain/ACS  No Known Allergies  Patient Measurements: Height:  (180.3 cm) Weight: 206 lb 14.4 oz (93.849 kg) IBW/kg (Calculated) : 75.3 Heparin Dosing Weight: 93.8 kg  Vital Signs: Temp: 98.6 F (37 C) (08/29 1602) Temp Source: Oral (08/29 1602) BP: 138/83 mmHg (08/29 1602) Pulse Rate: 74 (08/29 1602)  Labs:  Recent Labs  02/27/15 1044  HGB 16.4  HCT 49.0  PLT 188  APTT 33  LABPROT 14.0  INR 1.06  CREATININE 1.27*  TROPONINI 0.23*    Estimated Creatinine Clearance: 76 mL/min (by C-G formula based on Cr of 1.27).   Medical History: Past Medical History  Diagnosis Date  . Substance abuse   . A-fib   . Hypertension   . Arthritis   . COPD (chronic obstructive pulmonary disease)     Medications:  Scheduled:  .  stroke: mapping our early stages of recovery book   Does not apply Once  . albuterol  2.5 mg Nebulization QID  . amiodarone  200 mg Oral Daily  . [START ON 02/28/2015] aspirin  81 mg Oral Pre-Cath  . atorvastatin  80 mg Oral NOW   Followed by  . [START ON 02/28/2015] atorvastatin  80 mg Oral Q0600  . budesonide (PULMICORT) nebulizer solution  0.25 mg Nebulization BID  . doxycycline  100 mg Oral Q12H  . guaiFENesin  600 mg Oral BID  . ipratropium  0.5 mg Nebulization QID  . nicotine  14 mg Transdermal Daily  . predniSONE  40 mg Oral BID WC  . sodium chloride  3 mL Intravenous Q12H    Assessment: 54 yo male on Xarelto pta for AFib, pt with admitted poor compliance to Xarelto. Admitted with probable TIA, did not receive tpa. Pt also had elevated troponins, with known history of poly-substance abuse. He will undergo cardiac cath.  Due to pt missing Xarelto over the past few days, will begin heparin now.    Goal of Therapy:  Heparin level 0.3-0.7 units/ml Monitor platelets by anticoagulation protocol: Yes   Plan:  Heparin 1100 units/hr, hold heparin  bolus due to TIA Daily HL, CBC Monitor s/sx bleeding F/u plans for cath and potential AVR Check level this evening     Agapito Games, PharmD, BCPS Clinical Pharmacist Pager: [phone] 02/27/2015 4:30 PM

## 2015-02-27 NOTE — H&P (Signed)
[facility] History and Physical  Donald [name]:[id number] DOB: [birth year] DOA: 02/27/2015  Referring physician: Dr. Gwendolyn Grant PCP: Pcp Not In System   Chief Complaint: SOB, slurred speech, Left arm numbness   HPI: Donald [name] is a 54 y.o. male with PMH significant for A. Fib, HTN, arthritis, substance abuse (cocaine, alcohol), COPD, tobacco abuse and severe aortic stenosis. Who presented to ED with complaints of slurred speech and left arm numbness. Patient reports symptoms started last night while he was talking with AA sponsor. Some slurred speech, foggy thoughts and left arm numbness. Patient symptoms improved/resolved by the time he reached ED, except for his left arm numbness. No CP, no fever, no chills, no abd pain, no nausea, vomiting or diaphoresis. Patient also endorses some SOB and increase productive cough. In ED work up positive for elevated troponin up to 0.23 and wheezing. CXR w/o acute infiltrates. [facility] called to admit patient for TIA work up, COPD exacerbation and elevated troponin.     Review of Systems:  Negative except as mentioned on HPI.  Past Medical History  Diagnosis Date  . Substance abuse   . A-fib   . Hypertension   . Arthritis   . COPD (chronic obstructive pulmonary disease)    Past Surgical History  Procedure Laterality Date  . Tonsillectomy     Social History:  reports that he has been smoking Cigarettes.  He has a 88 pack-year smoking history. He does not have any smokeless tobacco history on file. He reports that he drinks alcohol. He reports that he uses illicit drugs (Cocaine) about 7 times per week.  No Known Allergies  Family History  Problem Relation Age of Onset  . Family history unknown: discussed with patient and he is not aware of any significant PMH on his family   Prior to Admission medications   Medication Sig Start Date End Date Taking? Authorizing Provider  albuterol (PROVENTIL) (2.5 MG/3ML) 0.083% nebulizer solution Take  2.5 mg by nebulization every 6 (six) hours as needed for wheezing or shortness of breath.  02/01/15  Yes Historical Provider, MD  amiodarone (PACERONE) 200 MG tablet Take 1 tablet (200 mg total) by mouth daily. For Atrial fibrillation/Hypertrophic Cardiomyopathy 08/03/12  Yes Sanjuana Kava, NP  budesonide-formoterol [facility]) 160-4.5 MCG/ACT inhaler Inhale 2 puffs into the lungs 2 (two) times daily.   Yes Historical Provider, MD  COMBIVENT RESPIMAT 20-100 MCG/ACT AERS respimat Take 1 puff by mouth every 6 (six) hours as needed for wheezing or shortness of breath.  02/01/15  Yes Historical Provider, MD  digoxin (LANOXIN) 0.25 MG tablet Take 0.25 mg by mouth daily.   Yes Historical Provider, MD  XARELTO 20 MG TABS tablet Take 20 mg by mouth daily with supper.  02/01/15  Yes Historical Provider, MD   Physical Exam: Filed Vitals:   02/27/15 1130 02/27/15 1215 02/27/15 1315 02/27/15 1345  BP: 135/91 125/95 154/95 157/94  Pulse: 73 70 61 67  Temp:      TempSrc:      Resp: SpO2: 98% 97% 97% 97%    Wt Readings from Last 3 Encounters:  02/03/15 90.6 kg (199 lb 11.8 oz)  12/09/13 102.059 kg (225 lb)  07/30/12 88.905 kg (196 lb)    General:  Appears calm, in no distress and cooperative to examination. No slurred speech and able to follow commands. Complaining mainly of SOB and dullness/tingling sensation on his left arm Eyes: PERRL, normal lids, irises & conjunctiva, no  icterus, no nystagmus  ENT: grossly normal hearing, lips & tongue, no erythema or exudates Neck: no LAD, masses or thyromegaly, no JVD Cardiovascular: Regular rate, S1 and S2, no rubs, no gallops, positive SEM Respiratory: CTA bilaterally, no w/r/r. Normal respiratory effort. Abdomen: soft, nt, nd, positive BS Skin: no rash or induration seen on exam Musculoskeletal: grossly normal tone BUE/BLE, no joint swelling Psychiatric: grossly normal mood and affect, speech fluent and appropriate Neurologic: reports left side  upper extremity weakness, CN intact, uvula and tongue midline; normal finger to nose and no dysarthria on exam. Gait not evaluated           Labs on Admission:  Basic Metabolic Panel:  Recent Labs Lab 02/27/15 1044  NA 140  K 4.3  CL 106  CO2 29  GLUCOSE 106*  BUN 17  CREATININE 1.27*  CALCIUM 9.4   Liver Function Tests:  Recent Labs Lab 02/27/15 1044  AST 16  ALT 15*  ALKPHOS 84  BILITOT 0.9  PROT 6.7  ALBUMIN 3.7   CBC:  Recent Labs Lab 02/27/15 1044  WBC 7.7  NEUTROABS 4.7  HGB 16.4  HCT 49.0  MCV 88.6  PLT 188   Cardiac Enzymes:  Recent Labs Lab 02/27/15 1044  TROPONINI 0.23*    BNP (last 3 results)  Recent Labs  02/02/15 1758  BNP 112.0*   CBG: No results for input(s): GLUCAP in the last 168 hours.  Radiological Exams on Admission: Dg Chest 2 View  02/27/2015   CLINICAL DATA:  Left upper extremity region pain. Atrial fibrillation. Hypertension.  EXAM: CHEST  2 VIEW  COMPARISON:  Chest radiograph and chest CT February 02, 2015  FINDINGS: There is a small nipple shadow on the right. There is no edema or consolidation. The heart size and pulmonary vascularity normal. No adenopathy. No pneumothorax. There old rib fractures on the left, stable.  IMPRESSION: Evidence of old rib trauma on the left. No edema or consolidation. No pneumothorax.   Electronically Signed   By: Bretta Bang III M.D.   On: 02/27/2015 12:20   Ct Head Wo Contrast  02/27/2015   CLINICAL DATA:  Slurred speech.  Left arm pain.  EXAM: CT HEAD WITHOUT CONTRAST  TECHNIQUE: Contiguous axial images were obtained from the base of the skull through the vertex without intravenous contrast.  COMPARISON:  02/02/2015, 10/19/2013  FINDINGS: There is no evidence of mass effect, midline shift or extra-axial fluid collections. There is no evidence of a space-occupying lesion or intracranial hemorrhage. There is no evidence of a cortical-based area of acute infarction.  The ventricles and sulci  are appropriate for the patient's age. The basal cisterns are patent.  Visualized portions of the orbits are unremarkable. The visualized portions of the paranasal sinuses and mastoid air cells are unremarkable.  The osseous structures are unremarkable.  IMPRESSION: Normal CT of the brain without intravenous contrast.   Electronically Signed   By: Elige Ko   On: 02/27/2015 12:08    EKG: No acute ischemic changes seen on EKG. Regular rate and Sinus rhythm   Assessment/Plan 1-slurred speech and left arm numbness: most likely TIA (transient ischemic attack), but will admit to r/o stroke. Patient has not been compliant with his xarelto in the last couple of days PTA. -will admit to telemetry  -will check MRI, carotid duplex, A1C and lipid profile -patient recently had 2-D echo during this month, so will no repeat -will continue xarelto and follow further rec's from neurology -patient is  symptoms free on exam and has passed swallowing test  -TIA protocol implemented  -will check B12  2-SOB (shortness of breath): due to COPD exacerbation -will use PRN oxygen supplementation -will start treatment with dual nebs and pulmicort, PO prednisone and doxycycline -will follow clinical response -patient advise to quit smoking  3-Elevated troponin: no CP. Most likely secondary to COPD exacerbation; but with risk factors and heart score of 4 -will cycle troponin and follow EKG in am -cardiology has been consulted and will follow rec's  4-Chronic atrial fibrillation: rate controlled -will continue amiodarone, digoxin and xarelto   5-COPD exacerbation: as mentioned above, most likely due to bronchiectasis and continue smoking -will use nebulizer treatment, doxycycline, prednisone, mucinex and IS -PRN oxygen -will follow clinical response  6-Tobacco abuse and cocaine abuse: will use nicotine patch -cessation counseling provided -patient has been clean for 3 weeks according to him   7-Chronic  arthritis: with slight flare on his right hip -will improved with steroids initiated for his breathing -will use PRN pain meds  8-Benign essential HTN: BP stable. Will monitor VS. He was not taking any meds at home..  9-chronic AS: follow up with outpatient cardiology.   Cardiology and neurology service (consulted by ED physician)  Code Status: Full DVT Prophylaxis:Xarelto (which would be resumed) Family Communication: no family at bedside Disposition Plan: LOS < 2 midnights, observation, telemetry   Time spent: 55 minutes  Vassie Loll [facility] Pager [phone]

## 2015-02-27 NOTE — ED Notes (Signed)
SA history of alcoholism. Has AA sponsor and has not had anything to drink in a month.

## 2015-02-27 NOTE — ED Notes (Signed)
Echo in progress at bedside.

## 2015-02-27 NOTE — ED Notes (Addendum)
PT was talking on phone and has slurred speech last night around 1000 last night. EMS arrived and symptoms resolved. Left arm pain that started around 0900. Dull and sore ache. No neuro deficits at this time. Takes xerelto but has not taken in 3-4 days.

## 2015-02-27 NOTE — ED Provider Notes (Signed)
CSN: [id number]     Arrival date & time 02/27/15  1030 History   First MD Initiated Contact with Patient 02/27/15 1030     Chief Complaint  Patient presents with  . Transient Ischemic Attack     (Consider location/radiation/quality/duration/timing/severity/associated sxs/prior Treatment) HPI Comments: Patient is a 54 yo M PMHx significant for COPD, A fib on xarelto, Substance abuse, HTN, tobacco abuse presenting to the ED for episode of left sided facial droop and slurred speech that lasted approximately 15-20 seconds per the patient. He states he was on the phone with his AA sponsor when it occurred, has not had any recurrence of speech difficulties or facial drooping since. He has been complaining about left arm pain, describes it as dull and achy without injury. He has not been taking his xarelto for 2-3 days as he missed placed it. Denies substance abuse to me, but endorses use to nurse this morning at [facility].    Past Medical History  Diagnosis Date  . Substance abuse   . A-fib   . Hypertension   . Arthritis   . COPD (chronic obstructive pulmonary disease)    Past Surgical History  Procedure Laterality Date  . Tonsillectomy     Family History  Problem Relation Age of Onset  . Family history unknown: Yes   Social History  Substance Use Topics  . Smoking status: Current Some Day Smoker -- 2.00 packs/day for 44 years    Types: Cigarettes  . Smokeless tobacco: None  . Alcohol Use: Yes    Review of Systems  Respiratory: Negative for shortness of breath.   Cardiovascular: Negative for chest pain.  Musculoskeletal:       + L arm pain   Neurological: Positive for facial asymmetry (L sided resolved) and speech difficulty (resolved).  All other systems reviewed and are negative.     Allergies  Review of patient's allergies indicates no known allergies.  Home Medications   Prior to Admission medications   Medication Sig Start Date End Date Taking? Authorizing Provider   albuterol (PROVENTIL) (2.5 MG/3ML) 0.083% nebulizer solution Take 2.5 mg by nebulization every 6 (six) hours as needed for wheezing or shortness of breath.  02/01/15  Yes Historical Provider, MD  amiodarone (PACERONE) 200 MG tablet Take 1 tablet (200 mg total) by mouth daily. For Atrial fibrillation/Hypertrophic Cardiomyopathy 08/03/12  Yes Sanjuana Kava, NP  budesonide-formoterol [facility]) 160-4.5 MCG/ACT inhaler Inhale 2 puffs into the lungs 2 (two) times daily.   Yes Historical Provider, MD  COMBIVENT RESPIMAT 20-100 MCG/ACT AERS respimat Take 1 puff by mouth every 6 (six) hours as needed for wheezing or shortness of breath.  02/01/15  Yes Historical Provider, MD  digoxin (LANOXIN) 0.25 MG tablet Take 0.25 mg by mouth daily.   Yes Historical Provider, MD  XARELTO 20 MG TABS tablet Take 20 mg by mouth daily with supper.  02/01/15  Yes Historical Provider, MD   BP 134/82 mmHg  Pulse 67  Temp(Src) 97.9 F (36.6 C) (Oral)  Resp 18  SpO2 94% Physical Exam  Constitutional: He is oriented to person, place, and time. He appears well-developed and well-nourished. No distress.  HENT:  Head: Normocephalic and atraumatic.  Right Ear: External ear normal.  Left Ear: External ear normal.  Nose: Nose normal.  Mouth/Throat: Oropharynx is clear and moist. No oropharyngeal exudate.  Eyes: Conjunctivae and EOM are normal. Pupils are equal, round, and reactive to light.  Neck: Normal range of motion. Neck supple.  Cardiovascular: Normal  rate, regular rhythm, normal heart sounds and intact distal pulses.   Pulmonary/Chest: Effort normal and breath sounds normal. No respiratory distress.  Abdominal: Soft. There is no tenderness.  Neurological: He is alert and oriented to person, place, and time. He has normal strength. No cranial nerve deficit. Gait normal. GCS eye subscore is 4. GCS verbal subscore is 5. GCS motor subscore is 6.  Sensation grossly intact.  No pronator drift.  Bilateral heel-knee-shin intact.   Skin: Skin is warm and dry. He is not diaphoretic.  Nursing note and vitals reviewed.   ED Course  Procedures (including critical care time) Medications  nicotine (NICODERM CQ - dosed in mg/24 hours) patch 14 mg (14 mg Transdermal Patch Applied 02/27/15 1530)  aspirin chewable tablet 324 mg (324 mg Oral Given 02/27/15 1216)    Labs Review Labs Reviewed  COMPREHENSIVE METABOLIC PANEL - Abnormal; Notable for the following:    Glucose, Bld 106 (*)    Creatinine, Ser 1.27 (*)    ALT 15 (*)    All other components within normal limits  TROPONIN I - Abnormal; Notable for the following:    Troponin I 0.23 (*)    All other components within normal limits  I-STAT TROPOININ, ED - Abnormal; Notable for the following:    Troponin i, poc 0.29 (*)    All other components within normal limits  ETHANOL  PROTIME-INR  APTT  CBC  DIFFERENTIAL  URINE RAPID DRUG SCREEN, HOSP PERFORMED  URINALYSIS, ROUTINE W REFLEX MICROSCOPIC (NOT AT [facility])  HEMOGLOBIN A1C  LIPID PANEL    Imaging Review Dg Chest 2 View  02/27/2015   CLINICAL DATA:  Left upper extremity region pain. Atrial fibrillation. Hypertension.  EXAM: CHEST  2 VIEW  COMPARISON:  Chest radiograph and chest CT February 02, 2015  FINDINGS: There is a small nipple shadow on the right. There is no edema or consolidation. The heart size and pulmonary vascularity normal. No adenopathy. No pneumothorax. There old rib fractures on the left, stable.  IMPRESSION: Evidence of old rib trauma on the left. No edema or consolidation. No pneumothorax.   Electronically Signed   By: Bretta Bang III M.D.   On: 02/27/2015 12:20   Ct Head Wo Contrast  02/27/2015   CLINICAL DATA:  Slurred speech.  Left arm pain.  EXAM: CT HEAD WITHOUT CONTRAST  TECHNIQUE: Contiguous axial images were obtained from the base of the skull through the vertex without intravenous contrast.  COMPARISON:  02/02/2015, 10/19/2013  FINDINGS: There is no evidence of mass effect, midline  shift or extra-axial fluid collections. There is no evidence of a space-occupying lesion or intracranial hemorrhage. There is no evidence of a cortical-based area of acute infarction.  The ventricles and sulci are appropriate for the patient's age. The basal cisterns are patent.  Visualized portions of the orbits are unremarkable. The visualized portions of the paranasal sinuses and mastoid air cells are unremarkable.  The osseous structures are unremarkable.  IMPRESSION: Normal CT of the brain without intravenous contrast.   Electronically Signed   By: Elige Ko   On: 02/27/2015 12:08   I have personally reviewed and evaluated these images and lab results as part of my medical decision-making.   EKG Interpretation   Date/Time:  Monday February 27 2015 10:44:44 EDT Ventricular Rate:  74 PR Interval:  138 QRS Duration: 156 QT Interval:  465 QTC Calculation: 516 R Axis:   115 Text Interpretation:  Ectopic atrial rhythm Nonspecific intraventricular  conduction delay  Inferior infarct, old Probable anteroseptal infarct, old  since last tracing no significant change Confirmed by [facility]  MD, ELLIOTT  [id number]) on 02/27/2015 11:22:12 AM      11:43 AM Cardiology will see in consultation.   12:41 PM Discussed with Dr. Leroy Kennedy    MDM   Final diagnoses:  Transient cerebral ischemia, unspecified transient cerebral ischemia type  Elevated troponin    Filed Vitals:   02/27/15 1415  BP: 134/82  Pulse: 67  Temp:   Resp: 18   Afebrile, NAD, non-toxic appearing, AAOx4.   No neurofocal deficits on examination. Concern for TIA last evening especially since patient with history of A. fib, with missed xarelto doses.  Labs reviewed. Troponin elevated, 324mg  ASA given. CP free currently. No other equivalent symptoms currently. Cardiology consultation will see in consultation in hospital.  CT scan without acute abnormality. Neuro hospitalist consultation will also see patient in consultation and  hospital under medical service.  Hospitalist consulted for admission.   Admitted for further management and evaluation. Patient d/w with Dr. Effie Shy, agrees with plan.     Francee Piccolo, PA-C 02/27/15 1541  Mancel Bale, MD 02/27/15 [phone]

## 2015-02-27 NOTE — ED Notes (Signed)
Pt placed in gown and in bed. Pt monitored by pulse ox, bp cuff, and 12-lead. 

## 2015-02-27 NOTE — Consult Note (Signed)
Referring Physician: ED    Chief Complaint: transient dysarthria, left face-arm weakness  HPI:                                                                                                                                         Donald [name] is an 54 y.o. male with a past medical history significant for HTN, aortic stenosis, pulmonary embolism, polysubstance abuse, COPD and chronic atrial fibrillation on Xarelto, comes in for evaluation of the above stated symptoms. Stated that never had similar symptoms before, but last night he was taking on the phone with his [place] sponsor when suddenly his left face and arm became weak and he started slurring his words. Said that the episode lasted for approximately 20 seconds and all symptoms subsided. Complains of HA that has been present x 3 days but denies vertigo, double vision, difficulty swallowing, confusion, or vision impairment. Notably, patient said that he had missed his xarelto x 3 or 4 days. CT brain performed in the ED was personally reviewed and showed no acute abnormality. Serologies demonstrated elevated troponin, negative UDS, alcohol level <5.  Date last known well: 02/26/15 Time last known well: 10 pm tPA Given: no, symptoms resolved   Past Medical History  Diagnosis Date  . Substance abuse   . A-fib   . Hypertension   . Arthritis   . COPD (chronic obstructive pulmonary disease)     Past Surgical History  Procedure Laterality Date  . Tonsillectomy      Family History  Problem Relation Age of Onset  . Family history unknown: Yes   Social History:  reports that he has been smoking Cigarettes.  He has a 88 pack-year smoking history. He does not have any smokeless tobacco history on file. He reports that he drinks alcohol. He reports that he uses illicit drugs (Cocaine) about 7 times per week. Family history: no MS, epilepsy, brain tumor, or brain aneurysm Allergies: No Known Allergies  Medications:                                                                                                                            I have reviewed the patient's current medications.  ROS:  History obtained from chart review and the patient  General ROS: negative for - chills, fatigue, fever, night sweats, weight gain or weight loss Psychological ROS: negative for - behavioral disorder, hallucinations, memory difficulties, mood swings or suicidal ideation Ophthalmic ROS: negative for - blurry vision, double vision, eye pain or loss of vision ENT ROS: negative for - epistaxis, nasal discharge, oral lesions, sore throat, tinnitus or vertigo Allergy and Immunology ROS: negative for - hives or itchy/watery eyes Hematological and Lymphatic ROS: negative for - bleeding problems, bruising or swollen lymph nodes Endocrine ROS: negative for - galactorrhea, hair pattern changes, polydipsia/polyuria or temperature intolerance Respiratory ROS: negative for - cough, hemoptysis, shortness of breath or wheezing Cardiovascular ROS: negative for - chest pain, dyspnea on exertion, edema or irregular heartbeat Gastrointestinal ROS: negative for - abdominal pain, diarrhea, hematemesis, nausea/vomiting or stool incontinence Genito-Urinary ROS: negative for - dysuria, hematuria, incontinence or urinary frequency/urgency Musculoskeletal ROS: negative for - joint swelling or muscular weakness Neurological ROS: as noted in HPI Dermatological ROS: negative for rash and skin lesion changes    Physical exam:  Constitutional: well developed, pleasant male in no apparent distress. Blood pressure 125/95, pulse 70, temperature 97.9 F (36.6 C), temperature source Oral, resp. rate 16, SpO2 97 %. Eyes: no jaundice or exophthalmos.  Head: normocephalic. Neck: supple, no bruits, no JVD. Cardiac: no murmurs. Lungs:  clear. Abdomen: soft, no tender, no mass. Extremities: no edema, clubbing, or cyanosis.  Skin: no rash  Neurologic Examination:                                                                                                      General: Mental Status: Alert, oriented, thought content appropriate.  Speech fluent without evidence of aphasia.  Able to follow 3 step commands without difficulty. Cranial Nerves: II: Discs flat bilaterally; Visual fields grossly normal, pupils equal, round, reactive to light and accommodation III,IV, VI: ptosis not present, extra-ocular motions intact bilaterally V,VII: smile symmetric, facial light touch sensation normal bilaterally VIII: hearing normal bilaterally IX,X: uvula rises symmetrically XI: bilateral shoulder shrug XII: midline tongue extension without atrophy or fasciculations  Motor: Right : Upper extremity   5/5    Left:     Upper extremity   5/5  Lower extremity   5/5     Lower extremity   5/5 Tone and bulk:normal tone throughout; no atrophy noted Sensory: Pinprick and light touch intact throughout, bilaterally Deep Tendon Reflexes:  Right: Upper Extremity   Left: Upper extremity   biceps (C-5 to C-6) 2/4   biceps (C-5 to C-6) 2/4 tricep (C7) 2/4    triceps (C7) 2/4 Brachioradialis (C6) 2/4  Brachioradialis (C6) 2/4  Lower Extremity Lower Extremity  quadriceps (L-2 to L-4) 2/4   quadriceps (L-2 to L-4) 2/4 Achilles (S1) 2/4   Achilles (S1) 2/4  Plantars: Right: downgoing   Left: downgoing Cerebellar: normal finger-to-nose,  normal heel-to-shin test Gait:  No ataxia. CV: pulses palpable throughout    Results for orders placed or performed during the hospital encounter of 02/27/15 (from the past 48 hour(s))  Ethanol     Status: None   Collection Time: 02/27/15 10:44 AM  Result Value Ref Range   Alcohol, Ethyl (B) <5 <5 mg/dL    Comment:        LOWEST DETECTABLE LIMIT FOR SERUM ALCOHOL IS 5 mg/dL FOR MEDICAL PURPOSES ONLY    Protime-INR     Status: None   Collection Time: 02/27/15 10:44 AM  Result Value Ref Range   Prothrombin Time 14.0 11.6 - 15.2 seconds   INR 1.06 0.00 - 1.49  APTT     Status: None   Collection Time: 02/27/15 10:44 AM  Result Value Ref Range   aPTT 33 24 - 37 seconds  CBC     Status: None   Collection Time: 02/27/15 10:44 AM  Result Value Ref Range   WBC 7.7 4.0 - 10.5 K/uL   RBC 5.53 4.22 - 5.81 MIL/uL   Hemoglobin 16.4 13.0 - 17.0 g/dL   HCT 49.0 39.0 - 52.0 %   MCV 88.6 78.0 - 100.0 fL   MCH 29.7 26.0 - 34.0 pg   MCHC 33.5 30.0 - 36.0 g/dL   RDW 14.5 11.5 - 15.5 %   Platelets 188 150 - 400 K/uL  Differential     Status: None   Collection Time: 02/27/15 10:44 AM  Result Value Ref Range   Neutrophils Relative % 60 43 - 77 %   Neutro Abs 4.7 1.7 - 7.7 K/uL   Lymphocytes Relative 27 12 - 46 %   Lymphs Abs 2.1 0.7 - 4.0 K/uL   Monocytes Relative 8 3 - 12 %   Monocytes Absolute 0.6 0.1 - 1.0 K/uL   Eosinophils Relative 4 0 - 5 %   Eosinophils Absolute 0.3 0.0 - 0.7 K/uL   Basophils Relative 1 0 - 1 %   Basophils Absolute 0.1 0.0 - 0.1 K/uL  Comprehensive metabolic panel     Status: Abnormal   Collection Time: 02/27/15 10:44 AM  Result Value Ref Range   Sodium 140 135 - 145 mmol/L   Potassium 4.3 3.5 - 5.1 mmol/L   Chloride 106 101 - 111 mmol/L   CO2 29 22 - 32 mmol/L   Glucose, Bld 106 (H) 65 - 99 mg/dL   BUN 17 6 - 20 mg/dL   Creatinine, Ser 1.27 (H) 0.61 - 1.24 mg/dL   Calcium 9.4 8.9 - 10.3 mg/dL   Total Protein 6.7 6.5 - 8.1 g/dL   Albumin 3.7 3.5 - 5.0 g/dL   AST 16 15 - 41 U/L   ALT 15 (L) 17 - 63 U/L   Alkaline Phosphatase 84 38 - 126 U/L   Total Bilirubin 0.9 0.3 - 1.2 mg/dL   GFR calc non Af Amer >60 >60 mL/min   GFR calc Af Amer >60 >60 mL/min    Comment: (NOTE) The eGFR has been calculated using the CKD EPI equation. This calculation has not been validated in all clinical situations. eGFR's persistently <60 mL/min signify possible Chronic  Kidney Disease.    Anion gap 5 5 - 15  Troponin I     Status: Abnormal   Collection Time: 02/27/15 10:44 AM  Result Value Ref Range   Troponin I 0.23 (H) <0.031 ng/mL    Comment:        PERSISTENTLY INCREASED TROPONIN VALUES IN THE RANGE OF 0.04-0.49 ng/mL CAN BE SEEN IN:       -UNSTABLE ANGINA       -CONGESTIVE HEART FAILURE       -  MYOCARDITIS       -CHEST TRAUMA       -ARRYHTHMIAS       -LATE PRESENTING MYOCARDIAL INFARCTION       -COPD   CLINICAL FOLLOW-UP RECOMMENDED.   I-stat troponin, ED (not at [facility], [facility])     Status: Abnormal   Collection Time: 02/27/15 11:03 AM  Result Value Ref Range   Troponin i, poc 0.29 (HH) 0.00 - 0.08 ng/mL   Comment NOTIFIED PHYSICIAN    Comment 3            Comment: Due to the release kinetics of cTnI, a negative result within the first hours of the onset of symptoms does not rule out myocardial infarction with certainty. If myocardial infarction is still suspected, repeat the test at appropriate intervals.    Dg Chest 2 View  02/27/2015   CLINICAL DATA:  Left upper extremity region pain. Atrial fibrillation. Hypertension.  EXAM: CHEST  2 VIEW  COMPARISON:  Chest radiograph and chest CT February 02, 2015  FINDINGS: There is a small nipple shadow on the right. There is no edema or consolidation. The heart size and pulmonary vascularity normal. No adenopathy. No pneumothorax. There old rib fractures on the left, stable.  IMPRESSION: Evidence of old rib trauma on the left. No edema or consolidation. No pneumothorax.   Electronically Signed   By: Lowella Grip III M.D.   On: 02/27/2015 12:20   Ct Head Wo Contrast  02/27/2015   CLINICAL DATA:  Slurred speech.  Left arm pain.  EXAM: CT HEAD WITHOUT CONTRAST  TECHNIQUE: Contiguous axial images were obtained from the base of the skull through the vertex without intravenous contrast.  COMPARISON:  02/02/2015, 10/19/2013  FINDINGS: There is no evidence of mass effect, midline shift or extra-axial fluid  collections. There is no evidence of a space-occupying lesion or intracranial hemorrhage. There is no evidence of a cortical-based area of acute infarction.  The ventricles and sulci are appropriate for the patient's age. The basal cisterns are patent.  Visualized portions of the orbits are unremarkable. The visualized portions of the paranasal sinuses and mastoid air cells are unremarkable.  The osseous structures are unremarkable.  IMPRESSION: Normal CT of the brain without intravenous contrast.   Electronically Signed   By: Kathreen Devoid   On: 02/27/2015 12:08   Assessment: 54 y.o. male with probable right brain TIA in the context of known atrial fibrillation. Of note, patient reports that he hasn't been complaint with his xarelto for the past 3 or 4 days. Has elevated troponin thought to be related to chronic cocaine use (UDS negative today). Admit to medicine. Ordered TIA work up. Continue xarelto and encourage full compliance. Stroke team will follo up tomorrow.   Stroke Risk Factors - HTN, atrial fibrillation, cocaine abuse.   Plan: 1. HgbA1c, fasting lipid panel 2. MRI, MRA  of the brain without contrast 3. Echocardiogram 4. Carotid dopplers 5. Prophylactic therapy-xarelto 6. Risk factor modification 7. Telemetry monitoring 8. Frequent neuro checks 9. PT/OT SLP   Dorian Pod, MD [facility] [phone]  02/27/2015, 1:16 PM

## 2015-02-27 NOTE — Progress Notes (Signed)
  Echocardiogram 2D Echocardiogram has been performed.  [name] 02/27/2015, 3:28 PM

## 2015-02-27 NOTE — ED Provider Notes (Signed)
  Face-to-face evaluation   History: He is here for evaluation of trouble talking, left facial weakness and unable to move his left arm. The symptoms lasted about 20 seconds, and occurred last night. Instead, he has a mild soreness in his left arm.  Physical exam: Alert, calm, cooperative. Normal range of motion, arms, legs, bilaterally. Left arm nontender to palpation. Heart regular rate and rhythm without murmur. Lungs decreased air mvt.  bilaterally.  Medical screening examination/treatment/procedure(s) were conducted as a shared visit with non-physician practitioner(s) and myself.  I personally evaluated the patient during the encounter  Mancel Bale, MD 02/27/15 1701

## 2015-02-27 NOTE — Consult Note (Addendum)
CARDIOLOGY CONSULT NOTE   Patient ID: Donald [name] MRN: [id number] DOB/AGE: 56-30-1962 56 y.o.  Admit date: 02/27/2015  Primary Physician   Pcp Not In System Primary Cardiologist   Dr. Dot Been [facility]) Reason for Consultation   Elevated troponin  HPI: Donald [name] is a 54 y.o. male with history of aortic stenosis, pulmonary embolism, polysubstance abuse, COPD and chronic atrial fibrillation on Xarelto and HTN who presented to the ED for episode of left sided facial droop and slurred speech, now resolved. Cardiology is consulted for elevated troponin.   He followes Dr. Dot Been at [facility] for aortic stenosis and chronic afib. He has been treated with amiodarone and is in sinus rhythm when last seen in clinic 12/17/14.   He was recently admitted 8/4-8/6 for acute encephalopathy, probably due to polysubstance abuse-  his UDS was positive for benzos and cocaine. At that time his trop tend was 0.18->0.20->0.16->0.10. During admission his diltiazem, metoprolol and amiodarone was held and resumed on discharge. Currently he is not on BB. Echo 02/03/15 showed LV EF of 55%, mild LVH, grade 1 DD,mild dilated LA,  Aortic valve -Mean gradient (S): 52 mm Hg. Peak gradient (S): 81 mm Hg. Valve area (VTI): 0.54 cm^2. Valve area (Vmax): 0.53 cm^2.  He states he was on the phone with his AA sponsor last night around 10pm (8/28) when he noticed slurred speech that lasted approximately 15-20 seconds per the patient.  He has been complaining about left forearm pain, describes it as dull and achy without injury that started this morning, still has mild discomfort. He has not been taking his xarelto for 2-3 days as he missed placed it. He does have chronic SOB and orthopnea. He denies chest pain, nausea, vomiting, melena, LE edema o rPND.    In ED, EKG with Sinus rhythm, no acute abnormality. POC trop of 0.29. Troponin I of 0.23. Blood alcohol level of less than 5. CXR showed  evidence of old rib trauma on the left, no edema or consolidation, no pneumothorax. CT of brain without acute abnormality. Pending UDS.   Per patient, he used cocaine 3 weeks ago.  Past Medical History  Diagnosis Date  . Substance abuse   . A-fib   . Hypertension   . Arthritis   . COPD (chronic obstructive pulmonary disease)      Past Surgical History  Procedure Laterality Date  . Tonsillectomy      No Known Allergies  I have reviewed the patient's current medications     Prior to Admission medications   Medication Sig Start Date End Date Taking? Authorizing Provider  albuterol (PROVENTIL) (2.5 MG/3ML) 0.083% nebulizer solution Take 2.5 mg by nebulization every 6 (six) hours as needed for wheezing or shortness of breath.  02/01/15  Yes Historical Provider, MD  amiodarone (PACERONE) 200 MG tablet Take 1 tablet (200 mg total) by mouth daily. For Atrial fibrillation/Hypertrophic Cardiomyopathy 08/03/12  Yes Sanjuana Kava, NP  budesonide-formoterol [facility]) 160-4.5 MCG/ACT inhaler Inhale 2 puffs into the lungs 2 (two) times daily.   Yes Historical Provider, MD  COMBIVENT RESPIMAT 20-100 MCG/ACT AERS respimat Take 1 puff by mouth every 6 (six) hours as needed for wheezing or shortness of breath.  02/01/15  Yes Historical Provider, MD  digoxin (LANOXIN) 0.25 MG tablet Take 0.25 mg by mouth daily.   Yes Historical Provider, MD  XARELTO 20 MG TABS tablet Take 20 mg by mouth daily with supper.  02/01/15  Yes Historical  Provider, MD     Social History   Social History  . Marital Status: Single    Spouse Name: N/A  . Number of Children: N/A  . Years of Education: N/A   Occupational History  . Not on file.   Social History Main Topics  . Smoking status: Current Some Day Smoker -- 2.00 packs/day for 44 years    Types: Cigarettes  . Smokeless tobacco: Not on file  . Alcohol Use: Yes  . Drug Use: 7.00 per week    Special: Cocaine  . Sexual Activity: Not on file     Comment: crack  cocaine this morning at 0300   Other Topics Concern  . Not on file   Social History Narrative    The patient was unable to provide family history.  ROS:  Full 14 point review of systems complete and found to be negative unless listed above.  Physical Exam: Blood pressure 125/95, pulse 70, temperature 97.9 F (36.6 C), temperature source Oral, resp. rate 16, SpO2 97 %.  General: Well developed, well nourished, male in no acute distress Head: Eyes PERRLA, No xanthomas. Normocephalic and atraumatic, oropharynx without edema or exudate.  Lungs: Resp regular and unlabored, CTA. Heart: RRR no s3, s4. Systolic  murmurs..   Neck: No carotid bruits. No lymphadenopathy orJVD. Abdomen: Bowel sounds present, abdomen soft and non-tender without masses or hernias noted. Msk:  No spine or cva tenderness. No weakness, no joint deformities or effusions. Extremities: No clubbing, cyanosis or edema. DP/PT/Radials 2+ and equal bilaterally. Neuro: Alert and oriented X 3. No focal deficits noted. Psych:  Good affect, responds appropriately Skin: No rashes or lesions noted.  Labs:   Lab Results  Component Value Date   WBC 7.7 02/27/2015   HGB 16.4 02/27/2015   HCT 49.0 02/27/2015   MCV 88.6 02/27/2015   PLT 188 02/27/2015    Recent Labs  02/27/15 1044  INR 1.06    Recent Labs Lab 02/27/15 1044  NA 140  K 4.3  CL 106  CO2 29  BUN 17  CREATININE 1.27*  CALCIUM 9.4  PROT 6.7  BILITOT 0.9  ALKPHOS 84  ALT 15*  AST 16  GLUCOSE 106*  ALBUMIN 3.7   No results found for: MG  Recent Labs  02/27/15 1044  TROPONINI 0.23*    Recent Labs  02/27/15 1103  TROPIPOC 0.29*    Echo: 02/03/15 LV EF: 55%  ------------------------------------------------------------------- Indications:   Dyspnea 786.09.  ------------------------------------------------------------------- History:  PMH: Aortic Stenosis, Cocaine Abuse, History of Pulmonary Embolism, ETOH. Atrial fibrillation.  Angina pectoris. Risk factors: Current tobacco use.  ------------------------------------------------------------------- Study Conclusions  - Left ventricle: The cavity size was normal. Wall thickness was increased in a pattern of mild LVH. The estimated ejection fraction was 55%. Although no diagnostic regional wall motion abnormality was identified, this possibility cannot be completely excluded on the basis of this study. Doppler parameters are consistent with abnormal left ventricular relaxation (grade 1 diastolic dysfunction). Septal thickness, ED (2D): 13 mm. - Aortic valve: Probably trileaflet; severely calcified leaflets. There was severe stenosis. Mean gradient (S): 52 mm Hg. Peak gradient (S): 81 mm Hg. Valve area (VTI): 0.54 cm^2. Valve area (Vmax): 0.53 cm^2. - Mitral valve: There was trivial regurgitation. - Left atrium: The atrium was mildly dilated. - Right ventricle: The cavity size was normal. Systolic function was normal. - Pulmonary arteries: No complete TR doppler jet so unable to estimate PA systolic pressure. - Systemic veins: IVC was poorly visualized.  Impressions:  - Normal LV size with mild LV hypertrophy. EF 55%. Normal RV size and systolic function. Severe aortic stenosis.   ECG: Vent. rate 74 BPM PR interval 138 ms QRS duration 156 ms QT/QTc 465/516 ms P-R-T axes 266 115 -46  Radiology:  Dg Chest 2 View  02/27/2015   CLINICAL DATA:  Left upper extremity region pain. Atrial fibrillation. Hypertension.  EXAM: CHEST  2 VIEW  COMPARISON:  Chest radiograph and chest CT February 02, 2015  FINDINGS: There is a small nipple shadow on the right. There is no edema or consolidation. The heart size and pulmonary vascularity normal. No adenopathy. No pneumothorax. There old rib fractures on the left, stable.  IMPRESSION: Evidence of old rib trauma on the left. No edema or consolidation. No pneumothorax.   Electronically Signed   By:  Bretta Bang III M.D.   On: 02/27/2015 12:20   Ct Head Wo Contrast  02/27/2015   CLINICAL DATA:  Slurred speech.  Left arm pain.  EXAM: CT HEAD WITHOUT CONTRAST  TECHNIQUE: Contiguous axial images were obtained from the base of the skull through the vertex without intravenous contrast.  COMPARISON:  02/02/2015, 10/19/2013  FINDINGS: There is no evidence of mass effect, midline shift or extra-axial fluid collections. There is no evidence of a space-occupying lesion or intracranial hemorrhage. There is no evidence of a cortical-based area of acute infarction.  The ventricles and sulci are appropriate for the patient's age. The basal cisterns are patent.  Visualized portions of the orbits are unremarkable. The visualized portions of the paranasal sinuses and mastoid air cells are unremarkable.  The osseous structures are unremarkable.  IMPRESSION: Normal CT of the brain without intravenous contrast.   Electronically Signed   By: Elige Ko   On: 02/27/2015 12:08    ASSESSMENT AND PLAN:     1. Elevated troponin - POC trop of 0.29. Troponin I of 0.23. EKG with Sinus rhythm, no acute abnormality. No chest pain.  - Patient dose have a hx of cocaine abuse.  During last admission his trop tend was 0.18->0.20->0.16->0.10 in setting of + urine drug screen of cocaine. UDS negative this time.   Encouraged substance abuse cessation, education given. - Avoid BB due hx of cocaine abuse.  - Will do left and right heart cath for definite evalutation of anatomy. NPO after mid night. Admit to medicine. Cycle troponin. Heparin per pharmacy.   2. Severe AS - Echo 02/03/15 showed LV EF of 55%, mild LVH, grade 1 DD,mild dilated LA,  Aortic valve -Mean gradient (S): 52 mm Hg. Peak gradient (S): 81 mm Hg. Valve area (VTI): 0.54 cm^2. Valve area (Vmax): 0.53 cm^2. - This is contributing his chronic stable SOB with recent PE and COPD.   3. Chronic Atrial Fibrillation - CHADSVASCs score of 1 (HTN). He is on Xarelto for  afib and PE by his cardiologist. Encouraged compliance of Xarelto. Hold for now for cath tomorrow.   - He has been treated with amiodarone and is in sinus rhythm when last seen in clinic 6/18/1 by his cardiologist. - He is sinus rhythm currently. Continue amio. Stop digoxin this might making AS worse.  - He has not been taking his xarelto for 2-3 days.  4. left sided facial droop and slurred speech - Now resolved.  - He states he was on the phone with his AA sponsor last night around 10pm (8/28) when he noticed slurred speech that lasted approximately 15-20 seconds per the patient. - CT  of brain without acute abnormality. Neuro consult is pending.  SignedManson Passey, PA 02/27/2015, 12:43 PM   The patient was seen, examined and discussed with Bhagat,Bhavinkumar PA-C and I agree with the above.   A 54 year old male with known cocaine abuse, paroxysmal a-fib on chronic anticoagulation (non-compliant), severe aortic stenosis who presented with a typical exertion chest pain. His troponin is 0.23 --> 0.29. His ECG doesn't show any acute changes. He was positive for a cocaine 3 weeks ago, but not at this visit. We will schedule him for a right and left cardiac cath. His pain is possibly sec to true ischemia or demand ischemia in the settings of severe AS with gradient 52 mmHg. He will require an aortic valve replacement, the cocaine abuse and h/o non-compliance need to be taken into consideration. We will consult CT surgery post cath.  Also, he presented with slurred speech, CT head negative, MRI is pending, if any abnormality we will cancel cardiac cath.  Discontinue digoxin, he is currently in SR.  Lars Masson 02/27/2015

## 2015-02-27 NOTE — ED Notes (Signed)
Pt not in room out for radiology.

## 2015-02-28 ENCOUNTER — Encounter (HOSPITAL_COMMUNITY): Payer: Self-pay | Admitting: Student

## 2015-02-28 ENCOUNTER — Observation Stay (HOSPITAL_BASED_OUTPATIENT_CLINIC_OR_DEPARTMENT_OTHER): Payer: Medicare HMO

## 2015-02-28 ENCOUNTER — Ambulatory Visit (HOSPITAL_COMMUNITY): Admission: RE | Admit: 2015-02-28 | Payer: Medicare HMO | Source: Ambulatory Visit | Admitting: Cardiology

## 2015-02-28 ENCOUNTER — Encounter (HOSPITAL_COMMUNITY)
Admission: EM | Disposition: A | Discharge status: Home / Self Care | Payer: Medicare HMO | Source: Home / Self Care | Attending: Emergency Medicine

## 2015-02-28 DIAGNOSIS — G451 Carotid artery syndrome (hemispheric): Secondary | ICD-10-CM | POA: Diagnosis not present

## 2015-02-28 DIAGNOSIS — R0602 Shortness of breath: Secondary | ICD-10-CM

## 2015-02-28 DIAGNOSIS — Z720 Tobacco use: Secondary | ICD-10-CM

## 2015-02-28 DIAGNOSIS — Z7901 Long term (current) use of anticoagulants: Secondary | ICD-10-CM

## 2015-02-28 DIAGNOSIS — I10 Essential (primary) hypertension: Secondary | ICD-10-CM | POA: Diagnosis not present

## 2015-02-28 DIAGNOSIS — Z86711 Personal history of pulmonary embolism: Secondary | ICD-10-CM

## 2015-02-28 DIAGNOSIS — Z9114 Patient's other noncompliance with medication regimen: Secondary | ICD-10-CM

## 2015-02-28 DIAGNOSIS — I350 Nonrheumatic aortic (valve) stenosis: Secondary | ICD-10-CM

## 2015-02-28 DIAGNOSIS — F1910 Other psychoactive substance abuse, uncomplicated: Secondary | ICD-10-CM | POA: Diagnosis not present

## 2015-02-28 DIAGNOSIS — G459 Transient cerebral ischemic attack, unspecified: Secondary | ICD-10-CM | POA: Diagnosis not present

## 2015-02-28 DIAGNOSIS — I482 Chronic atrial fibrillation: Secondary | ICD-10-CM | POA: Diagnosis not present

## 2015-02-28 HISTORY — PX: CARDIAC CATHETERIZATION: SHX172

## 2015-02-28 LAB — CBC
HCT: 45.2 % (ref 39.0–52.0)
Hemoglobin: 15.2 g/dL (ref 13.0–17.0)
MCH: 29.4 pg (ref 26.0–34.0)
MCHC: 33.6 g/dL (ref 30.0–36.0)
MCV: 87.4 fL (ref 78.0–100.0)
PLATELETS: 168 10*3/uL (ref 150–400)
RBC: 5.17 MIL/uL (ref 4.22–5.81)
RDW: 13.9 % (ref 11.5–15.5)
WBC: 6.3 10*3/uL (ref 4.0–10.5)

## 2015-02-28 LAB — HEPARIN LEVEL (UNFRACTIONATED)
HEPARIN UNFRACTIONATED: 0.5 [IU]/mL (ref 0.30–0.70)
HEPARIN UNFRACTIONATED: 0.37 [IU]/mL (ref 0.30–0.70)
Heparin Unfractionated: 0.28 IU/mL — ABNORMAL LOW (ref 0.30–0.70)

## 2015-02-28 LAB — POCT I-STAT 3, ART BLOOD GAS (G3+)
Acid-base deficit: 2 mmol/L (ref 0.0–2.0)
Bicarbonate: 23.2 mEq/L (ref 20.0–24.0)
O2 SAT: 97 %
PCO2 ART: 40.7 mmHg (ref 35.0–45.0)
PH ART: 7.363 (ref 7.350–7.450)
PO2 ART: 94 mmHg (ref 80.0–100.0)
TCO2: 24 mmol/L (ref 0–100)

## 2015-02-28 LAB — POCT I-STAT 3, VENOUS BLOOD GAS (G3P V)
Acid-base deficit: 1 mmol/L (ref 0.0–2.0)
BICARBONATE: 24.8 meq/L — AB (ref 20.0–24.0)
O2 SAT: 73 %
PCO2 VEN: 44.2 mmHg — AB (ref 45.0–50.0)
PH VEN: 7.357 — AB (ref 7.250–7.300)
PO2 VEN: 41 mmHg (ref 30.0–45.0)
TCO2: 26 mmol/L (ref 0–100)

## 2015-02-28 LAB — LIPID PANEL
CHOL/HDL RATIO: 4.7 ratio
CHOLESTEROL: 208 mg/dL — AB (ref 0–200)
HDL: 44 mg/dL (ref >40)
LDL Cholesterol: 144 mg/dL — ABNORMAL HIGH (ref 0–99)
Triglycerides: 98 mg/dL (ref <150)
VLDL: 20 mg/dL (ref 0–40)

## 2015-02-28 LAB — TROPONIN I: TROPONIN I: 0.1 ng/mL — AB (ref <0.031)

## 2015-02-28 LAB — HEMOGLOBIN A1C
Hgb A1c MFr Bld: 6.2 % — ABNORMAL HIGH (ref 4.8–5.6)
Mean Plasma Glucose: 131 mg/dL

## 2015-02-28 SURGERY — RIGHT/LEFT HEART CATH AND CORONARY ANGIOGRAPHY

## 2015-02-28 MED ORDER — FENTANYL CITRATE (PF) 100 MCG/2ML IJ SOLN
INTRAMUSCULAR | Status: AC
  Filled 2015-02-28: qty 4

## 2015-02-28 MED ORDER — NITROGLYCERIN 1 MG/10 ML FOR IR/CATH LAB
INTRA_ARTERIAL | Status: AC
  Filled 2015-02-28: qty 10

## 2015-02-28 MED ORDER — IPRATROPIUM-ALBUTEROL 0.5-2.5 (3) MG/3ML IN SOLN
RESPIRATORY_TRACT | Status: AC
  Administered 2015-02-28: 3 mL via RESPIRATORY_TRACT
  Filled 2015-02-28: qty 3

## 2015-02-28 MED ORDER — IPRATROPIUM-ALBUTEROL 0.5-2.5 (3) MG/3ML IN SOLN
3.0000 mL | Freq: Four times a day (QID) | RESPIRATORY_TRACT | Status: DC
  Administered 2015-02-28 – 2015-03-03 (×11): 3 mL via RESPIRATORY_TRACT
  Filled 2015-02-28 (×12): qty 3

## 2015-02-28 MED ORDER — HEPARIN (PORCINE) IN NACL 100-0.45 UNIT/ML-% IJ SOLN
1750.0000 [IU]/h | INTRAMUSCULAR | Status: DC
  Administered 2015-03-01 – 2015-03-02 (×3): 1600 [IU]/h via INTRAVENOUS
  Filled 2015-02-28 (×4): qty 250

## 2015-02-28 MED ORDER — SODIUM CHLORIDE 0.9 % IV SOLN: 250.0000 mL | INTRAVENOUS | Status: DC | PRN

## 2015-02-28 MED ORDER — SODIUM CHLORIDE 0.9 % IJ SOLN: 3.0000 mL | INTRAMUSCULAR | Status: DC | PRN

## 2015-02-28 MED ORDER — LIDOCAINE HCL (PF) 1 % IJ SOLN
INTRAMUSCULAR | Status: DC | PRN
  Administered 2015-02-28: 18:00:00

## 2015-02-28 MED ORDER — VERAPAMIL HCL 2.5 MG/ML IV SOLN
INTRAVENOUS | Status: AC
  Filled 2015-02-28: qty 2

## 2015-02-28 MED ORDER — HEPARIN (PORCINE) IN NACL 2-0.9 UNIT/ML-% IJ SOLN
INTRAMUSCULAR | Status: AC
  Filled 2015-02-28: qty 1000

## 2015-02-28 MED ORDER — FENTANYL CITRATE (PF) 100 MCG/2ML IJ SOLN
INTRAMUSCULAR | Status: DC | PRN
  Administered 2015-02-28 (×2): 50 ug via INTRAVENOUS

## 2015-02-28 MED ORDER — MIDAZOLAM HCL 2 MG/2ML IJ SOLN
INTRAMUSCULAR | Status: DC | PRN
  Administered 2015-02-28 (×2): 1 mg via INTRAVENOUS

## 2015-02-28 MED ORDER — SODIUM CHLORIDE 0.9 % WEIGHT BASED INFUSION: 3.0000 mL/kg/h | INTRAVENOUS | Status: AC

## 2015-02-28 MED ORDER — VERAPAMIL HCL 2.5 MG/ML IV SOLN
INTRAVENOUS | Status: DC | PRN
  Administered 2015-02-28: 17:00:00 via INTRA_ARTERIAL

## 2015-02-28 MED ORDER — MIDAZOLAM HCL 2 MG/2ML IJ SOLN
INTRAMUSCULAR | Status: AC
  Filled 2015-02-28: qty 4

## 2015-02-28 MED ORDER — SODIUM CHLORIDE 0.9 % IJ SOLN
3.0000 mL | Freq: Two times a day (BID) | INTRAMUSCULAR | Status: DC
  Administered 2015-03-01 – 2015-03-03 (×3): 3 mL via INTRAVENOUS

## 2015-02-28 MED ORDER — ONDANSETRON HCL 4 MG/2ML IJ SOLN: 4.0000 mg | Freq: Four times a day (QID) | INTRAMUSCULAR | Status: DC | PRN

## 2015-02-28 MED ORDER — ASPIRIN 81 MG PO CHEW
81.0000 mg | CHEWABLE_TABLET | Freq: Every day | ORAL | Status: DC
  Administered 2015-03-01 – 2015-03-03 (×3): 81 mg via ORAL
  Filled 2015-02-28 (×3): qty 1

## 2015-02-28 MED ORDER — HEPARIN (PORCINE) IN NACL 2-0.9 UNIT/ML-% IJ SOLN
INTRAMUSCULAR | Status: AC
  Filled 2015-02-28: qty 500

## 2015-02-28 MED ORDER — LIDOCAINE HCL (PF) 1 % IJ SOLN
INTRAMUSCULAR | Status: AC
  Filled 2015-02-28: qty 30

## 2015-02-28 MED ORDER — HEPARIN SODIUM (PORCINE) 1000 UNIT/ML IJ SOLN
INTRAMUSCULAR | Status: AC
  Filled 2015-02-28: qty 1

## 2015-02-28 MED ORDER — IOHEXOL 350 MG/ML SOLN
INTRAVENOUS | Status: DC | PRN
  Administered 2015-02-28: 70 mL via INTRACARDIAC

## 2015-02-28 MED ORDER — HEPARIN SODIUM (PORCINE) 1000 UNIT/ML IJ SOLN
INTRAMUSCULAR | Status: DC | PRN
  Administered 2015-02-28: 5000 [IU] via INTRAVENOUS

## 2015-02-28 SURGICAL SUPPLY — 14 items
CATH BALLN WEDGE 5F 110CM (CATHETERS) ×2 IMPLANT
CATH INFINITI 5 FR JL3.5 (CATHETERS) ×3 IMPLANT
CATH INFINITI 5FR AL1 (CATHETERS) ×2 IMPLANT
CATH INFINITI JR4 5F (CATHETERS) ×3 IMPLANT
DEVICE RAD COMP TR BAND LRG (VASCULAR PRODUCTS) ×3 IMPLANT
GLIDESHEATH SLEND A-KIT 6F 22G (SHEATH) ×3 IMPLANT
KIT HEART LEFT (KITS) ×3 IMPLANT
PACK CARDIAC CATHETERIZATION (CUSTOM PROCEDURE TRAY) ×3 IMPLANT
SHEATH FAST CATH BRACH 5F 5CM (SHEATH) ×2 IMPLANT
TRANSDUCER W/STOPCOCK (MISCELLANEOUS) ×3 IMPLANT
TUBING CIL FLEX 10 FLL-RA (TUBING) ×3 IMPLANT
WIRE EMERALD ST .035X150CM (WIRE) ×2 IMPLANT
WIRE HI TORQ VERSACORE-J 145CM (WIRE) ×2 IMPLANT
WIRE SAFE-T 1.5MM-J .035X260CM (WIRE) ×3 IMPLANT

## 2015-02-28 NOTE — Progress Notes (Signed)
[facility] PROGRESS NOTE  Donald [name]:[id number] DOB: 09/20/1960 DOA: 02/27/2015 PCP: Pcp Not In System  Assessment/Plan: 1. L arm numbness and slurred speech -resolved, MRI negative -Neuro following, now on Heparin due to elevated troponin, Xarelto on hold for LHC -FU ECHO and Carotid duplex -LDL 144-start statin, hbaic 6.2  2. Elevated troponin -no chest pain, demand vs true ischemia, h/o cocaine use, UDS negative now -for LHC today, also eval AS -on heparin now  3. Severe AS  4. COPD/Tobacco abuse -no wheezing noted -will continue nebs, stop Abx and prednisone -counseled  5. H/o Osteoarthritis of R hip -advised Ortho FU  6. P.Afib -in NSR, continue amiodarone, dig held, xarelto on hold for [facility]  Code Status: Full Code Family Communication: none at bedside Disposition Plan: home pending workup   Consultants:  [facility]  Neuro  HPI/Subjective: Feels better, LUE numbness resolved  Objective: Filed Vitals:   02/28/15 0848  BP: 138/79  Pulse: 78  Temp:   Resp:     Intake/Output Summary (Last 24 hours) at 02/28/15 1135 Last data filed at 02/27/15 2312  Gross per 24 hour  Intake    960 ml  Output      0 ml  Net    960 ml   Filed Weights   02/27/15 1550 02/28/15 0601  Weight: 93.849 kg (206 lb 14.4 oz) 93.441 kg (206 lb)    Exam:   General: AAOx3  Cardiovascular: S1S2/RRR, loud Ejection systolic murmur  Respiratory: CTAB  Abdomen: soft, NT, BS present  Musculoskeletal: no edema c/c   Data Reviewed: Basic Metabolic Panel:  Recent Labs Lab 02/27/15 1044  NA 140  K 4.3  CL 106  CO2 29  GLUCOSE 106*  BUN 17  CREATININE 1.27*  CALCIUM 9.4   Liver Function Tests:  Recent Labs Lab 02/27/15 1044  AST 16  ALT 15*  ALKPHOS 84  BILITOT 0.9  PROT 6.7  ALBUMIN 3.7   No results for input(s): LIPASE, AMYLASE in the last 168 hours. No results for input(s): AMMONIA in the last 168 hours. CBC:  Recent Labs Lab  02/27/15 1044 02/28/15 0317  WBC 7.7 6.3  NEUTROABS 4.7  --   HGB 16.4 15.2  HCT 49.0 45.2  MCV 88.6 87.4  PLT 188 168   Cardiac Enzymes:  Recent Labs Lab 02/27/15 1044 02/27/15 1634 02/27/15 2221 02/28/15 0317  TROPONINI 0.23* 0.18* 0.10* 0.10*   BNP (last 3 results)  Recent Labs  02/02/15 1758  BNP 112.0*    ProBNP (last 3 results) No results for input(s): PROBNP in the last 8760 hours.  CBG: No results for input(s): GLUCAP in the last 168 hours.  No results found for this or any previous visit (from the past 240 hour(s)).   Studies: Dg Chest 2 View  02/27/2015   CLINICAL DATA:  Left upper extremity region pain. Atrial fibrillation. Hypertension.  EXAM: CHEST  2 VIEW  COMPARISON:  Chest radiograph and chest CT February 02, 2015  FINDINGS: There is a small nipple shadow on the right. There is no edema or consolidation. The heart size and pulmonary vascularity normal. No adenopathy. No pneumothorax. There old rib fractures on the left, stable.  IMPRESSION: Evidence of old rib trauma on the left. No edema or consolidation. No pneumothorax.   Electronically Signed   By: Bretta Bang III M.D.   On: 02/27/2015 12:20   Ct Head Wo Contrast  02/27/2015   CLINICAL DATA:  Slurred speech.  Left arm  pain.  EXAM: CT HEAD WITHOUT CONTRAST  TECHNIQUE: Contiguous axial images were obtained from the base of the skull through the vertex without intravenous contrast.  COMPARISON:  02/02/2015, 10/19/2013  FINDINGS: There is no evidence of mass effect, midline shift or extra-axial fluid collections. There is no evidence of a space-occupying lesion or intracranial hemorrhage. There is no evidence of a cortical-based area of acute infarction.  The ventricles and sulci are appropriate for the patient's 54. The basal cisterns are patent.  Visualized portions of the orbits are unremarkable. The visualized portions of the paranasal sinuses and mastoid air cells are unremarkable.  The osseous  structures are unremarkable.  IMPRESSION: Normal CT of the brain without intravenous contrast.   Electronically Signed   By: Elige Ko   On: 02/27/2015 12:08   [name] Head Wo Contrast  02/27/2015   CLINICAL DATA:  54 year old hypertensive male with atrial fibrillation and history of substance abuse presenting with slurred speech and left arm numbness. Subsequent encounter.  EXAM: MRI HEAD WITHOUT CONTRAST  MRA HEAD WITHOUT CONTRAST  TECHNIQUE: Multiplanar, multiecho pulse sequences of the brain and surrounding structures were obtained without intravenous contrast. Angiographic images of the head were obtained using MRA technique without contrast.  COMPARISON:  02/27/2015 head CT.  No comparison brain MR.  FINDINGS: MRI HEAD FINDINGS  No acute infarct.  No intracranial hemorrhage.  Tiny area of encephalomalacia posterior medial left parietal-occipital lobe.  No hydrocephalus.  No intracranial mass lesion noted on this unenhanced exam.  Degenerative changes right C1- occiput articulation with fluid extending away from the joint.  Partially empty non expanded sella.  Cervical medullary junction pineal region unremarkable. Orbital structures within normal limits.  MRA HEAD FINDINGS  Anterior circulation without medium or large size vessel significant stenosis or occlusion. Mild narrowing right middle cerebral artery bifurcation. Mild middle cerebral artery branch vessel irregularity bilaterally.  Codominant vertebral arteries without high-grade stenosis.  Mild irregularity basilar artery without high-grade stenosis.  Nonvisualized anterior inferior cerebellar arteries.  Mild superior cerebellar arteries.  Mild posterior cerebral artery proximal and distal branch vessel irregularity and narrowing.  IMPRESSION: MRI HEAD FINDINGS  No acute infarct.  Tiny area of encephalomalacia posterior medial left parietal-occipital lobe.  Degenerative changes right C1- occiput articulation with fluid extending away from the  joint.  MRA HEAD FINDINGS  Mild intracranial atherosclerotic type changes without medium or large size vessel significant stenosis occlusion.   Electronically Signed   By: Lacy Duverney M.D.   On: 02/27/2015 21:16   Mr Brain Wo Contrast  02/27/2015   CLINICAL DATA:  54 year old hypertensive male with atrial fibrillation and history of substance abuse presenting with slurred speech and left arm numbness. Subsequent encounter.  EXAM: MRI HEAD WITHOUT CONTRAST  MRA HEAD WITHOUT CONTRAST  TECHNIQUE: Multiplanar, multiecho pulse sequences of the brain and surrounding structures were obtained without intravenous contrast. Angiographic images of the head were obtained using MRA technique without contrast.  COMPARISON:  02/27/2015 head CT.  No comparison brain MR.  FINDINGS: MRI HEAD FINDINGS  No acute infarct.  No intracranial hemorrhage.  Tiny area of encephalomalacia posterior medial left parietal-occipital lobe.  No hydrocephalus.  No intracranial mass lesion noted on this unenhanced exam.  Degenerative changes right C1- occiput articulation with fluid extending away from the joint.  Partially empty non expanded sella.  Cervical medullary junction pineal region unremarkable. Orbital structures within normal limits.  MRA HEAD FINDINGS  Anterior circulation without medium or large size vessel significant stenosis or  occlusion. Mild narrowing right middle cerebral artery bifurcation. Mild middle cerebral artery branch vessel irregularity bilaterally.  Codominant vertebral arteries without high-grade stenosis.  Mild irregularity basilar artery without high-grade stenosis.  Nonvisualized anterior inferior cerebellar arteries.  Mild superior cerebellar arteries.  Mild posterior cerebral artery proximal and distal branch vessel irregularity and narrowing.  IMPRESSION: MRI HEAD FINDINGS  No acute infarct.  Tiny area of encephalomalacia posterior medial left parietal-occipital lobe.  Degenerative changes right C1- occiput  articulation with fluid extending away from the joint.  MRA HEAD FINDINGS  Mild intracranial atherosclerotic type changes without medium or large size vessel significant stenosis occlusion.   Electronically Signed   By: Lacy Duverney M.D.   On: 02/27/2015 21:16    Scheduled Meds: . amiodarone  200 mg Oral Daily  . atorvastatin  80 mg Oral Q0600  . budesonide (PULMICORT) nebulizer solution  0.25 mg Nebulization BID  . doxycycline  100 mg Oral Q12H  . guaiFENesin  600 mg Oral BID  . Influenza vac split quadrivalent PF  0.5 mL Intramuscular Tomorrow-1000  . ipratropium-albuterol  3 mL Nebulization QID  . nicotine  14 mg Transdermal Daily  . predniSONE  40 mg Oral BID WC  . sodium chloride  3 mL Intravenous Q12H   Continuous Infusions: . sodium chloride 1 mL/kg/hr (02/28/15 0727)  . heparin 1,600 Units/hr (02/28/15 0350)   Antibiotics Given (last 72 hours)    Date/Time Action Medication Dose   02/27/15 2139 Given   doxycycline (VIBRA-TABS) tablet 100 mg 100 mg   02/28/15 1006 Given   doxycycline (VIBRA-TABS) tablet 100 mg 100 mg      Principal Problem:   TIA (transient ischemic attack) Active Problems:   SOB (shortness of breath)   Elevated troponin   Chronic atrial fibrillation   COPD exacerbation   Tobacco abuse   Chronic arthritis   Benign essential HTN    Time spent:    [facility]  [facility] Pager [phone]. If 7PM-7AM, please contact night-coverage at [email or web address], password [facility] 02/28/2015, 11:35 AM

## 2015-02-28 NOTE — Interval H&P Note (Signed)
Cath Lab Visit (complete for each Cath Lab visit)  Clinical Evaluation Leading to the Procedure:   ACS: Yes.    Non-ACS:    Anginal Classification: CCS III  Anti-ischemic medical therapy: Minimal Therapy (1 class of medications)  Non-Invasive Test Results: No non-invasive testing performed  Prior CABG: No previous CABG      History and Physical Interval Note:  02/28/2015 4:24 PM  Donald [name]  has presented today for surgery, with the diagnosis of c/p  The various methods of treatment have been discussed with the patient and family. After consideration of risks, benefits and other options for treatment, the patient has consented to  Procedure(s): Left Heart Cath and Coronary Angiography (N/A) as a surgical intervention .  The patient's history has been reviewed, patient examined, no change in status, stable for surgery.  I have reviewed the patient's chart and labs.  Questions were answered to the patient's satisfaction.     [name]

## 2015-02-28 NOTE — Evaluation (Signed)
  Occupational Therapy Evaluation Patient Details Name: Donald [name] MRN: [id number] DOB: March 17, 1961 Today's Date: 02/28/2015    History of Present Illness 54 y.o. disabled (hip DJD) male with history of severe aortic stenosis by recent echo, pulmonary embolism, recent polysubstance abuse, COPD and CAF on Xarelto (though he has not had any for weeks) and HTN who presented to the ED for episode of left sided facial droop and slurred speech, now resolved. MRI: No acute infarct.   Clinical Impression   Patient admitted with above. Patient independent and living in a recovery place PTA. Pt reports not feeling comfortable there, notified CM of this. Pt also reports wanting to stay at [facility], notified CM of this as well. Patient currently functioning at an independent level. D/C from acute OT services and no additional follow-up OT needs at this time. All appropriate education provided to patient. Please re-order OT if needed.      Follow Up Recommendations  No OT follow up    Equipment Recommendations  None recommended by OT    Recommendations for Other Services  None at this time    Precautions / Restrictions Precautions Precautions: None Restrictions Weight Bearing Restrictions: No    Mobility Bed Mobility Overal bed mobility: Independent  Transfers Overall transfer level: Independent   Balance Overall balance assessment: Independent;No apparent balance deficits (not formally assessed)    ADL Overall ADL's : Independent;At baseline     Pertinent Vitals/Pain Pain Assessment: No/denies pain     Hand Dominance Right   Extremity/Trunk Assessment Upper Extremity Assessment Upper Extremity Assessment: Overall WFL for tasks assessed   Lower Extremity Assessment Lower Extremity Assessment: Overall WFL for tasks assessed   Cervical / Trunk Assessment Cervical / Trunk Assessment: Normal   Communication Communication Communication: No difficulties   Cognition  Arousal/Alertness: Awake/alert Behavior During Therapy: WFL for tasks assessed/performed Overall Cognitive Status: Within Functional Limits for tasks assessed                Home Living Family/patient expects to be discharged to:: Shelter/Homeless Additional Comments: Pt reports he lives in a recovery house(for the past week), but states he doesn't feel comfortable there.       Prior Functioning/Environment Level of Independence: Independent      OT Diagnosis: Generalized weakness   OT Problem List:  n/a, no acute OT needs identified    OT Treatment/Interventions:   n/a, no acute OT needs identified    OT Goals(Current goals can be found in the care plan section) Acute Rehab OT Goals Patient Stated Goal: talk to SW/CM OT Goal Formulation: All assessment and education complete, DC therapy  OT Frequency:   n/a, no acute OT needs identified    Barriers to D/C:  None known at this time    End of Session Activity Tolerance: Patient tolerated treatment well Patient left: in bed;with call bell/phone within reach   Time: 1346-1357 OT Time Calculation (min): 11 min Charges:  OT General Charges $OT Visit: 1 Procedure OT Evaluation $Initial OT Evaluation Tier I: 1 Procedure G-Codes: OT G-codes **NOT FOR INPATIENT CLASS** Functional Limitation: Self care Self Care Current Status (W0981): 0 percent impaired, limited or restricted Self Care Goal Status (X9147): 0 percent impaired, limited or restricted Self Care Discharge Status (W2956): 0 percent impaired, limited or restricted  Donald [name] , MS, OTR/L, CLT Pager: [phone]  02/28/2015, 2:11 PM

## 2015-02-28 NOTE — Progress Notes (Signed)
PT Cancellation Note  Patient Details Name: Donald [name] MRN: [id number] DOB: 13-Jan-1961   Cancelled Treatment:    Reason Eval/Treat Not Completed: OT screened, no needs identified, will sign off.  OT reports pt is independent.  No PT needs identified.  See OT notes for details. PT to sign off. Thanks,    Rollene Rotunda. Mariellen Blaney, PT, DPT [phone]   02/28/2015, 2:30 PM

## 2015-02-28 NOTE — Progress Notes (Signed)
ANTICOAGULATION CONSULT NOTE  Pharmacy Consult for heparin Indication: chest pain/ACS  No Known Allergies  Patient Measurements: Height:  (180.3 cm) Weight: 206 lb (93.441 kg) IBW/kg (Calculated) : 75.3 Heparin Dosing Weight: 93.8 kg  Vital Signs: Temp: 98 F (36.7 C) (08/30 0601) Temp Source: Oral (08/30 0601) BP: 138/79 mmHg (08/30 0848) Pulse Rate: 78 (08/30 0848)  Labs:  Recent Labs  02/27/15 1044 02/27/15 1634 02/27/15 2221 02/28/15 0317  HGB 16.4  --   --  15.2  HCT 49.0  --   --  45.2  PLT 188  --   --  168  APTT 33  --   --   --   LABPROT 14.0  --   --   --   INR 1.06  --   --   --   HEPARINUNFRC  --   --  <0.10* 0.28*  CREATININE 1.27*  --   --   --   TROPONINI 0.23* 0.18* 0.10* 0.10*    Estimated Creatinine Clearance: 75.8 mL/min (by C-G formula based on Cr of 1.27).   Medical History: Past Medical History  Diagnosis Date  . Substance abuse   . A-fib   . Hypertension   . Arthritis   . COPD (chronic obstructive pulmonary disease)     Medications:  Scheduled:  . amiodarone  200 mg Oral Daily  . atorvastatin  80 mg Oral Q0600  . budesonide (PULMICORT) nebulizer solution  0.25 mg Nebulization BID  . doxycycline  100 mg Oral Q12H  . guaiFENesin  600 mg Oral BID  . Influenza vac split quadrivalent PF  0.5 mL Intramuscular Tomorrow-1000  . ipratropium-albuterol  3 mL Nebulization QID  . nicotine  14 mg Transdermal Daily  . predniSONE  40 mg Oral BID WC  . sodium chloride  3 mL Intravenous Q12H    Assessment: 54 yo male on Xarelto pta for AFib, pt with admitted poor compliance to Xarelto. Admitted with probable TIA, did not receive tpa. Pt also had elevated troponins, with known history of poly-substance abuse. CBC wnl. Patient reports not taking his xarelto for several days  HL therapeutic x1 (0.5) on 1600 units/h. Diagnostic cath today   Goal of Therapy:  Heparin level 0.3-0.7 units/ml Monitor platelets by anticoagulation  protocol: Yes   Plan:  Heparin 1600 units/hr 6h HL (confirm) Daily HL, CBC Monitor s/sx bleeding Plan cath today  F/u potential AVR plans   Babs Bertin, PharmD Clinical Pharmacist Pager [phone] 02/28/2015 8:54 AM

## 2015-02-28 NOTE — Progress Notes (Signed)
VASCULAR LAB PRELIMINARY  PRELIMINARY  PRELIMINARY  PRELIMINARY  Carotid Duplex Completed. Bilateral 1-39% stenosis in the ICAs. Bilateral vertebral arteries antegrade flow.  Chauncy Lean, RVT 02/28/2015, 9:29 AM

## 2015-02-28 NOTE — Progress Notes (Signed)
Subjective:  No chest pain  Objective:  Vital Signs in the last 24 hours: Temp:  [97.5 F (36.4 C)-98.6 F (37 C)] 98 F (36.7 C) (08/30 0601) Pulse Rate:  [61-85] 78 (08/30 0848) Resp:  [15-21] 18 (08/30 0601) BP: (125-157)/(79-95) 138/79 mmHg (08/30 0848) SpO2:  [94 %-97 %] 95 % (08/30 0848) Weight:  [206 lb (93.441 kg)-206 lb 14.4 oz (93.849 kg)] 206 lb (93.441 kg) (08/30 0601)  Intake/Output from previous day:  Intake/Output Summary (Last 24 hours) at 02/28/15 1144 Last data filed at 02/27/15 2312  Gross per 24 hour  Intake    960 ml  Output      0 ml  Net    960 ml    Physical Exam: General appearance: alert, cooperative and no distress Lungs: decreased breath sounds Heart: regular rate and rhythm Extremities: good Rt radial pulse   Rate: 66  Rhythm: normal sinus rhythm  Lab Results:  Recent Labs  02/27/15 1044 02/28/15 0317  WBC 7.7 6.3  HGB 16.4 15.2  PLT 188 168    Recent Labs  02/27/15 1044  NA 140  K 4.3  CL 106  CO2 29  GLUCOSE 106*  BUN 17  CREATININE 1.27*    Recent Labs  02/27/15 2221 02/28/15 0317  TROPONINI 0.10* 0.10*    Recent Labs  02/27/15 1044  INR 1.06    Scheduled Meds: . amiodarone  200 mg Oral Daily  . atorvastatin  80 mg Oral Q0600  . budesonide (PULMICORT) nebulizer solution  0.25 mg Nebulization BID  . guaiFENesin  600 mg Oral BID  . Influenza vac split quadrivalent PF  0.5 mL Intramuscular Tomorrow-1000  . ipratropium-albuterol  3 mL Nebulization QID  . nicotine  14 mg Transdermal Daily  . sodium chloride  3 mL Intravenous Q12H   Continuous Infusions: . sodium chloride 1 mL/kg/hr (02/28/15 0727)  . heparin 1,600 Units/hr (02/28/15 0350)   PRN Meds:.sodium chloride, acetaminophen **OR** acetaminophen, sodium chloride   Imaging: Dg Chest 2 View  02/27/2015   CLINICAL DATA:  Left upper extremity region pain. Atrial fibrillation. Hypertension.  EXAM: CHEST  2 VIEW  COMPARISON:  Chest radiograph and  chest CT February 02, 2015  FINDINGS: There is a small nipple shadow on the right. There is no edema or consolidation. The heart size and pulmonary vascularity normal. No adenopathy. No pneumothorax. There old rib fractures on the left, stable.  IMPRESSION: Evidence of old rib trauma on the left. No edema or consolidation. No pneumothorax.   Electronically Signed   By: Bretta Bang III M.D.   On: 02/27/2015 12:20   Ct Head Wo Contrast  02/27/2015   CLINICAL DATA:  Slurred speech.  Left arm pain.  EXAM: CT HEAD WITHOUT CONTRAST  TECHNIQUE: Contiguous axial images were obtained from the base of the skull through the vertex without intravenous contrast.  COMPARISON:  02/02/2015, 10/19/2013  FINDINGS: There is no evidence of mass effect, midline shift or extra-axial fluid collections. There is no evidence of a space-occupying lesion or intracranial hemorrhage. There is no evidence of a cortical-based area of acute infarction.  The ventricles and sulci are appropriate for the patient's age. The basal cisterns are patent.  Visualized portions of the orbits are unremarkable. The visualized portions of the paranasal sinuses and mastoid air cells are unremarkable.  The osseous structures are unremarkable.  IMPRESSION: Normal CT of the brain without intravenous contrast.   Electronically Signed   By: Elige Ko  On: 02/27/2015 12:08   [name] Head Wo Contrast  02/27/2015   CLINICAL DATA:  53 year old hypertensive male with atrial fibrillation and history of substance abuse presenting with slurred speech and left arm numbness. Subsequent encounter.  EXAM: MRI HEAD WITHOUT CONTRAST  MRA HEAD WITHOUT CONTRAST  TECHNIQUE: Multiplanar, multiecho pulse sequences of the brain and surrounding structures were obtained without intravenous contrast. Angiographic images of the head were obtained using MRA technique without contrast.  COMPARISON:  02/27/2015 head CT.  No comparison brain MR.  FINDINGS: MRI HEAD FINDINGS  No  acute infarct.  No intracranial hemorrhage.  Tiny area of encephalomalacia posterior medial left parietal-occipital lobe.  No hydrocephalus.  No intracranial mass lesion noted on this unenhanced exam.  Degenerative changes right C1- occiput articulation with fluid extending away from the joint.  Partially empty non expanded sella.  Cervical medullary junction pineal region unremarkable. Orbital structures within normal limits.  MRA HEAD FINDINGS  Anterior circulation without medium or large size vessel significant stenosis or occlusion. Mild narrowing right middle cerebral artery bifurcation. Mild middle cerebral artery branch vessel irregularity bilaterally.  Codominant vertebral arteries without high-grade stenosis.  Mild irregularity basilar artery without high-grade stenosis.  Nonvisualized anterior inferior cerebellar arteries.  Mild superior cerebellar arteries.  Mild posterior cerebral artery proximal and distal branch vessel irregularity and narrowing.  IMPRESSION: MRI HEAD FINDINGS  No acute infarct.  Tiny area of encephalomalacia posterior medial left parietal-occipital lobe.  Degenerative changes right C1- occiput articulation with fluid extending away from the joint.  MRA HEAD FINDINGS  Mild intracranial atherosclerotic type changes without medium or large size vessel significant stenosis occlusion.   Electronically Signed   By: Lacy Duverney M.D.   On: 02/27/2015 21:16   Mr Brain Wo Contrast  02/27/2015   CLINICAL DATA:  54 year old hypertensive male with atrial fibrillation and history of substance abuse presenting with slurred speech and left arm numbness. Subsequent encounter.  EXAM: MRI HEAD WITHOUT CONTRAST  MRA HEAD WITHOUT CONTRAST  TECHNIQUE: Multiplanar, multiecho pulse sequences of the brain and surrounding structures were obtained without intravenous contrast. Angiographic images of the head were obtained using MRA technique without contrast.  COMPARISON:  02/27/2015 head CT.  No  comparison brain MR.  FINDINGS: MRI HEAD FINDINGS  No acute infarct.  No intracranial hemorrhage.  Tiny area of encephalomalacia posterior medial left parietal-occipital lobe.  No hydrocephalus.  No intracranial mass lesion noted on this unenhanced exam.  Degenerative changes right C1- occiput articulation with fluid extending away from the joint.  Partially empty non expanded sella.  Cervical medullary junction pineal region unremarkable. Orbital structures within normal limits.  MRA HEAD FINDINGS  Anterior circulation without medium or large size vessel significant stenosis or occlusion. Mild narrowing right middle cerebral artery bifurcation. Mild middle cerebral artery branch vessel irregularity bilaterally.  Codominant vertebral arteries without high-grade stenosis.  Mild irregularity basilar artery without high-grade stenosis.  Nonvisualized anterior inferior cerebellar arteries.  Mild superior cerebellar arteries.  Mild posterior cerebral artery proximal and distal branch vessel irregularity and narrowing.  IMPRESSION: MRI HEAD FINDINGS  No acute infarct.  Tiny area of encephalomalacia posterior medial left parietal-occipital lobe.  Degenerative changes right C1- occiput articulation with fluid extending away from the joint.  MRA HEAD FINDINGS  Mild intracranial atherosclerotic type changes without medium or large size vessel significant stenosis occlusion.   Electronically Signed   By: Lacy Duverney M.D.   On: 02/27/2015 21:16    Cardiac Studies: Echo 02/27/15 Study Conclusions  -  Left ventricle: The cavity size was normal. There was mild concentric hypertrophy. Systolic function was normal. The estimated ejection fraction was in the range of 50% to 55%. Although no diagnostic regional wall motion abnormality was identified, this possibility cannot be completely excluded on the basis of this study. Doppler parameters are consistent with abnormal left ventricular relaxation (grade 1  diastolic dysfunction). - Aortic valve: Cusp separation was reduced. There was moderate to severe stenosis. Peak velocity (S): 392 cm/s. Mean gradient (S): 35 mm Hg. Valve area (VTI): 0.56 cm^2. Valve area (Vmax): 0.5 cm^2. Valve area (Vmean): 0.55 cm^2. Prior echo from earlier this month demonstrated severe aortic stenosis (mean gradient ).   Assessment/Plan:  54 y.o. disabled (hip DJD) male with history of severe aortic stenosis by recent echo, pulmonary embolism, recent polysubstance abuse, COPD and CAF on Xarelto (though he has not had any for weeks) and HTN who presented to the ED for episode of left sided facial droop and slurred speech, now resolved. Cardiology was consulted for elevated troponin.   Principal Problem:   TIA (transient ischemic attack) Active Problems:   NSTEMI (non-ST elevated myocardial infarction)   Severe aortic stenosis   SOB (shortness of breath)   Chronic atrial fibrillation   Benign essential HTN   Polysubstance abuse   Chronic anticoagulation-Xarelto   Non compliance w medication regimen   History of pulmonary embolism   Tobacco abuse   Chronic arthritis   PLAN: Cath today- he has not had any Xarelto for the past few weeks per pt.   Corine Shelter PA-C 02/28/2015, 11:44 AM [phone]  Patient seen and examined. Agree with assessment and plan. No further chest pain.  For R and L heart cath later today with exertional chest pain symptomology and severe AS. The risks and benefits of a cardiac catheterization including, but not limited to, death, stroke, MI, kidney damage and bleeding were discussed with the patient who indicates understanding and agrees to proceed.    Lennette Bihari, MD, [facility] 02/28/2015 2:13 PM

## 2015-02-28 NOTE — Progress Notes (Signed)
STROKE TEAM PROGRESS NOTE   SUBJECTIVE (INTERVAL HISTORY) No family at bedside. Patient was sleeping, easily arousable, and carried appropriate conversation. He stated that he had left arm face weakness and slurry speech for 20 seconds, now resolved. He admitted that in missed Xarelto dose for at least 1 week. He is going to [employer] test today.   OBJECTIVE Temp:  [97.5 F (36.4 C)-98.6 F (37 C)] 98 F (36.7 C) (08/30 0601) Pulse Rate:  [61-95] 78 (08/30 0848) Cardiac Rhythm:  [-] Normal sinus rhythm (08/29 2000) Resp:  [15-21] 18 (08/30 0601) BP: (125-157)/(77-95) 138/79 mmHg (08/30 0848) SpO2:  [94 %-98 %] 95 % (08/30 0848) Weight:  [93.441 kg (206 lb)-93.849 kg (206 lb 14.4 oz)] 93.441 kg (206 lb) (08/30 0601)  CBC:  Recent Labs Lab 02/27/15 1044 02/28/15 0317  WBC 7.7 6.3  NEUTROABS 4.7  --   HGB 16.4 15.2  HCT 49.0 45.2  MCV 88.6 87.4  PLT 188 168   Basic Metabolic Panel:  Recent Labs Lab 02/27/15 1044  NA 140  K 4.3  CL 106  CO2 29  GLUCOSE 106*  BUN 17  CREATININE 1.27*  CALCIUM 9.4   Lipid Panel:    Component Value Date/Time   CHOL 208* 02/28/2015 0317   TRIG 98 02/28/2015 0317   HDL 44 02/28/2015 0317   CHOLHDL 4.7 02/28/2015 0317   VLDL 20 02/28/2015 0317   LDLCALC 144* 02/28/2015 0317   HgbA1c:  Lab Results  Component Value Date   HGBA1C 6.2* 02/27/2015   Urine Drug Screen:    Component Value Date/Time   LABOPIA NONE DETECTED 02/27/2015 1252   COCAINSCRNUR NONE DETECTED 02/27/2015 1252   LABBENZ NONE DETECTED 02/27/2015 1252   AMPHETMU NONE DETECTED 02/27/2015 1252   THCU NONE DETECTED 02/27/2015 1252   LABBARB NONE DETECTED 02/27/2015 1252     IMAGING  Dg Chest 2 View 02/27/2015   Evidence of old rib trauma on the left. No edema or consolidation. No pneumothorax.     Ct Head Wo Contrast 02/27/2015    Normal CT of the brain without intravenous contrast.     MRI HEAD   02/27/2015 No acute infarct.  Tiny area of encephalomalacia  posterior medial left parietal-occipital lobe.  Degenerative changes right C1- occiput articulation with fluid extending away from the joint.   MRA HEAD 02/27/2015 Mild intracranial atherosclerotic type changes without medium or large size vessel significant stenosis occlusion.     Carotid Doppler  There is 1-39% bilateral ICA stenosis. Vertebral artery flow is antegrade.    2D Echocardiogram   - Left ventricle: The cavity size was normal. There was mildconcentric hypertrophy. Systolic function was normal. Theestimated ejection fraction was in the range of 50% to 55%.Although no diagnostic regional wall motion abnormality wasidentified, this possibility cannot be completely excluded on thebasis of this study. Doppler parameters are consistent withabnormal left ventricular relaxation (grade 1 diastolicdysfunction). - Aortic valve: Cusp separation was reduced. There was moderate tosevere stenosis. Peak velocity (S): 392 cm/s. Mean gradient (S):35 mm Hg. Valve area (VTI): 0.56 cm^2. Valve area (Vmax): 0.5cm^2. Valve area (Vmean): 0.55 cm^2. Prior echo from earlier thismonth demonstrated severe aortic stenosis (mean gradient ).   PHYSICAL EXAM Physical exam  Temp:  [97.5 F (36.4 C)-98 F (36.7 C)] 97.7 F (36.5 C) (08/30 1252) Pulse Rate:  [70-95] 82 (08/30 1732) Resp:  [10-25] 25 (08/30 1732) BP: (119-147)/(78-97) 139/97 mmHg (08/30 1732) SpO2:  [91 %-96 %] 96 % (08/30 1732) Weight:  [206  lb (93.441 kg)] 206 lb (93.441 kg) (08/30 0601)  General - Well nourished, well developed, in no apparent distress.  Ophthalmologic - Sharp disc margins OU  Cardiovascular - Regular rate and rhythm with no murmur.  Mental Status -  Level of arousal and orientation to time, place, and person were intact. Language including expression, naming, repetition, comprehension was assessed and found intact. Fund of Knowledge was assessed and was intact.  Cranial Nerves II - XII - II - Visual  field intact OU. III, IV, VI - Extraocular movements intact. V - Facial sensation intact bilaterally. VII - Facial movement intact bilaterally. VIII - Hearing & vestibular intact bilaterally. X - Palate elevates symmetrically. XI - Chin turning & shoulder shrug intact bilaterally. XII - Tongue protrusion intact.  Motor Strength - The patient's strength was normal in all extremities and pronator drift was absent.  Bulk was normal and fasciculations were absent.   Motor Tone - Muscle tone was assessed at the neck and appendages and was normal.  Reflexes - The patient's reflexes were 1+ in all extremities and he had no pathological reflexes.  Sensory - Light touch, temperature/pinprick, vibration and proprioception, and Romberg testing were assessed and were symmetrical.    Coordination - The patient had normal movements in the hands and feet with no ataxia or dysmetria.  Tremor was absent.  Gait and Station - deferred at this time.   ASSESSMENT/PLAN [name] is a 54 y.o. male with history of A. Fib on Xarelto but missed doses recently, PE, HTN, arthritis, substance abuse (cocaine, alcohol), COPD, tobacco abuse and severe aortic stenosis presenting with transient dysarthria, left face-arm weakness. He did not receive IV t-PA due to symptoms resolved.   TIA R brain - likely due to A. fib not on anticoagulation  MRI  No acute stroke  MRA  Mild intracranial atherosclerotic disease  Carotid Doppler  No significant stenosis   2D Echo  EF 50-55%, No SOE. Severe AS  LDL 144  HgbA1c 6.2  IV heparin for VTE prophylaxis  Diet NPO time specified Except for: Sips with Meds  xarelto ( rivaroxaban) prior to admission though not taking for 3-4 days prior to arrival, now on IV heparin for ACS and elevated troponin. Patient will need to transition back to drowse on discharge.  Patient counseled to be compliant with his antithrombotic medications.   Ongoing aggressive stroke risk  factor management  Therapy recommendations:  No therapy needs  Disposition:  Return home  Atrial Fibrillation  Home anticoagulation:  xarelto   Currently on IV heparin  On amiodarone  Resume xarelto on discharge.  Medication compliance needs to be emphasized  Hypertension  Stable  Hyperlipidemia  Home meds:  No statin  LDL 144, goal < 70  New lipitor 80 mg daily added this admission  Continue statin at discharge  Tobacco abuse  Current smoker  Smoking cessation counseling provided  Nicotine patch provided  Pt is willing to quit  Other Stroke Risk Factors  ETOH use  Other Active Problems  Elevated troponin, for cath   COPD  OA R hip  Hospital day # 1  Neurology will sign off. Please call with questions. Pt will follow up with Dr. Roda Shutters at [facility] in about 2 months. Thanks for the consult.  Marvel Plan, MD PhD Stroke Neurology 02/28/2015 5:45 PM   To contact Stroke Continuity provider, please refer to [email or web address]. After hours, contact General Neurology

## 2015-02-28 NOTE — Progress Notes (Signed)
ANTICOAGULATION CONSULT NOTE - Follow Up Consult  Pharmacy Consult for heparin Indication: atrial fibrillation   Labs:  Recent Labs  02/27/15 1044 02/27/15 1634 02/27/15 2221 02/28/15 0317  HGB 16.4  --   --  15.2  HCT 49.0  --   --  45.2  PLT 188  --   --  168  APTT 33  --   --   --   LABPROT 14.0  --   --   --   INR 1.06  --   --   --   HEPARINUNFRC  --   --  <0.10* 0.28*  CREATININE 1.27*  --   --   --   TROPONINI 0.23* 0.18* 0.10*  --      Assessment: 54yo male slightly subtherapeutic on heparin after rate increase.  Goal of Therapy:  Heparin level 0.3-0.7 units/ml   Plan:  Will increase heparin gtt slightly to 1600 units/hr and check level in 6hr.  Vernard Gambles, PharmD, BCPS  02/28/2015,3:50 AM

## 2015-02-28 NOTE — Progress Notes (Addendum)
ANTICOAGULATION CONSULT NOTE - Follow Up Consult  Pharmacy Consult for heparin Indication: chest pain/ACS  No Known Allergies  Patient Measurements: Height:  (180.3 cm) Weight: 206 lb (93.441 kg) IBW/kg (Calculated) : 75.3 Heparin Dosing Weight: 93 kg  Vital Signs: Temp: 97.7 F (36.5 C) (08/30 1252) Temp Source: Oral (08/30 1252) BP: 140/78 mmHg (08/30 1252) Pulse Rate: 70 (08/30 1252)  Labs:  Recent Labs  02/27/15 1044 02/27/15 1634  02/27/15 2221 02/28/15 0317 02/28/15 1130 02/28/15 1625  HGB 16.4  --   --   --  15.2  --   --   HCT 49.0  --   --   --  45.2  --   --   PLT 188  --   --   --  168  --   --   APTT 33  --   --   --   --   --   --   LABPROT 14.0  --   --   --   --   --   --   INR 1.06  --   --   --   --   --   --   HEPARINUNFRC  --   --   < > <0.10* 0.28* 0.50 0.37  CREATININE 1.27*  --   --   --   --   --   --   TROPONINI 0.23* 0.18*  --  0.10* 0.10*  --   --   < > = values in this interval not displayed.  Estimated Creatinine Clearance: 75.8 mL/min (by C-G formula based on Cr of 1.27).   Medications:  Scheduled:  . [MAR Hold] amiodarone  200 mg Oral Daily  . atorvastatin  80 mg Oral Q0600  . [MAR Hold] budesonide (PULMICORT) nebulizer solution  0.25 mg Nebulization BID  . [MAR Hold] guaiFENesin  600 mg Oral BID  . Influenza vac split quadrivalent PF  0.5 mL Intramuscular Tomorrow-1000  . [MAR Hold] ipratropium-albuterol  3 mL Nebulization QID  . [MAR Hold] nicotine  14 mg Transdermal Daily  . sodium chloride  3 mL Intravenous Q12H   Infusions:  . sodium chloride 1 mL/kg/hr (02/28/15 0727)  . heparin 1,600 Units/hr (02/28/15 0350)    Assessment: 54 yo male with ACS is currently on therapeutic heparin.  Heparin level is 0.37. Currently in cath.  Goal of Therapy:  Heparin level 0.3-0.7 units/ml Monitor platelets by anticoagulation protocol: Yes   Plan:  - f/u after cath.  Margarito Dehaas, Tsz-Yin 02/28/2015,5:06 PM  Addendum:  S/p cath.   MD wants to restart heparin 8hrs post sheath removal.  Sheath was removed at 1735 per RN.    Plan - Restart heparin at 1600 units/hr @ 0130 on 03/01/15 - 6hr heparin level

## 2015-02-28 NOTE — H&P (View-Only) (Signed)
Subjective:  No chest pain  Objective:  Vital Signs in the last 24 hours: Temp:  [97.5 F (36.4 C)-98.6 F (37 C)] 98 F (36.7 C) (08/30 0601) Pulse Rate:  [61-85] 78 (08/30 0848) Resp:  [15-21] 18 (08/30 0601) BP: (125-157)/(79-95) 138/79 mmHg (08/30 0848) SpO2:  [94 %-97 %] 95 % (08/30 0848) Weight:  [206 lb (93.441 kg)-206 lb 14.4 oz (93.849 kg)] 206 lb (93.441 kg) (08/30 0601)  Intake/Output from previous day:  Intake/Output Summary (Last 24 hours) at 02/28/15 1144 Last data filed at 02/27/15 2312  Gross per 24 hour  Intake    960 ml  Output      0 ml  Net    960 ml    Physical Exam: General appearance: alert, cooperative and no distress Lungs: decreased breath sounds Heart: regular rate and rhythm Extremities: good Rt radial pulse   Rate: 66  Rhythm: normal sinus rhythm  Lab Results:  Recent Labs  02/27/15 1044 02/28/15 0317  WBC 7.7 6.3  HGB 16.4 15.2  PLT 188 168    Recent Labs  02/27/15 1044  NA 140  K 4.3  CL 106  CO2 29  GLUCOSE 106*  BUN 17  CREATININE 1.27*    Recent Labs  02/27/15 2221 02/28/15 0317  TROPONINI 0.10* 0.10*    Recent Labs  02/27/15 1044  INR 1.06    Scheduled Meds: . amiodarone  200 mg Oral Daily  . atorvastatin  80 mg Oral Q0600  . budesonide (PULMICORT) nebulizer solution  0.25 mg Nebulization BID  . guaiFENesin  600 mg Oral BID  . Influenza vac split quadrivalent PF  0.5 mL Intramuscular Tomorrow-1000  . ipratropium-albuterol  3 mL Nebulization QID  . nicotine  14 mg Transdermal Daily  . sodium chloride  3 mL Intravenous Q12H   Continuous Infusions: . sodium chloride 1 mL/kg/hr (02/28/15 0727)  . heparin 1,600 Units/hr (02/28/15 0350)   PRN Meds:.sodium chloride, acetaminophen **OR** acetaminophen, sodium chloride   Imaging: Dg Chest 2 View  02/27/2015   CLINICAL DATA:  Left upper extremity region pain. Atrial fibrillation. Hypertension.  EXAM: CHEST  2 VIEW  COMPARISON:  Chest radiograph and  chest CT February 02, 2015  FINDINGS: There is a small nipple shadow on the right. There is no edema or consolidation. The heart size and pulmonary vascularity normal. No adenopathy. No pneumothorax. There old rib fractures on the left, stable.  IMPRESSION: Evidence of old rib trauma on the left. No edema or consolidation. No pneumothorax.   Electronically Signed   By: Bretta Bang III M.D.   On: 02/27/2015 12:20   Ct Head Wo Contrast  02/27/2015   CLINICAL DATA:  Slurred speech.  Left arm pain.  EXAM: CT HEAD WITHOUT CONTRAST  TECHNIQUE: Contiguous axial images were obtained from the base of the skull through the vertex without intravenous contrast.  COMPARISON:  02/02/2015, 10/19/2013  FINDINGS: There is no evidence of mass effect, midline shift or extra-axial fluid collections. There is no evidence of a space-occupying lesion or intracranial hemorrhage. There is no evidence of a cortical-based area of acute infarction.  The ventricles and sulci are appropriate for the patient's age. The basal cisterns are patent.  Visualized portions of the orbits are unremarkable. The visualized portions of the paranasal sinuses and mastoid air cells are unremarkable.  The osseous structures are unremarkable.  IMPRESSION: Normal CT of the brain without intravenous contrast.   Electronically Signed   By: Elige Ko  On: 02/27/2015 12:08   [name] Head Wo Contrast  02/27/2015   CLINICAL DATA:  53 year old hypertensive male with atrial fibrillation and history of substance abuse presenting with slurred speech and left arm numbness. Subsequent encounter.  EXAM: MRI HEAD WITHOUT CONTRAST  MRA HEAD WITHOUT CONTRAST  TECHNIQUE: Multiplanar, multiecho pulse sequences of the brain and surrounding structures were obtained without intravenous contrast. Angiographic images of the head were obtained using MRA technique without contrast.  COMPARISON:  02/27/2015 head CT.  No comparison brain MR.  FINDINGS: MRI HEAD FINDINGS  No  acute infarct.  No intracranial hemorrhage.  Tiny area of encephalomalacia posterior medial left parietal-occipital lobe.  No hydrocephalus.  No intracranial mass lesion noted on this unenhanced exam.  Degenerative changes right C1- occiput articulation with fluid extending away from the joint.  Partially empty non expanded sella.  Cervical medullary junction pineal region unremarkable. Orbital structures within normal limits.  MRA HEAD FINDINGS  Anterior circulation without medium or large size vessel significant stenosis or occlusion. Mild narrowing right middle cerebral artery bifurcation. Mild middle cerebral artery branch vessel irregularity bilaterally.  Codominant vertebral arteries without high-grade stenosis.  Mild irregularity basilar artery without high-grade stenosis.  Nonvisualized anterior inferior cerebellar arteries.  Mild superior cerebellar arteries.  Mild posterior cerebral artery proximal and distal branch vessel irregularity and narrowing.  IMPRESSION: MRI HEAD FINDINGS  No acute infarct.  Tiny area of encephalomalacia posterior medial left parietal-occipital lobe.  Degenerative changes right C1- occiput articulation with fluid extending away from the joint.  MRA HEAD FINDINGS  Mild intracranial atherosclerotic type changes without medium or large size vessel significant stenosis occlusion.   Electronically Signed   By: Lacy Duverney M.D.   On: 02/27/2015 21:16   Mr Brain Wo Contrast  02/27/2015   CLINICAL DATA:  54 year old hypertensive male with atrial fibrillation and history of substance abuse presenting with slurred speech and left arm numbness. Subsequent encounter.  EXAM: MRI HEAD WITHOUT CONTRAST  MRA HEAD WITHOUT CONTRAST  TECHNIQUE: Multiplanar, multiecho pulse sequences of the brain and surrounding structures were obtained without intravenous contrast. Angiographic images of the head were obtained using MRA technique without contrast.  COMPARISON:  02/27/2015 head CT.  No  comparison brain MR.  FINDINGS: MRI HEAD FINDINGS  No acute infarct.  No intracranial hemorrhage.  Tiny area of encephalomalacia posterior medial left parietal-occipital lobe.  No hydrocephalus.  No intracranial mass lesion noted on this unenhanced exam.  Degenerative changes right C1- occiput articulation with fluid extending away from the joint.  Partially empty non expanded sella.  Cervical medullary junction pineal region unremarkable. Orbital structures within normal limits.  MRA HEAD FINDINGS  Anterior circulation without medium or large size vessel significant stenosis or occlusion. Mild narrowing right middle cerebral artery bifurcation. Mild middle cerebral artery branch vessel irregularity bilaterally.  Codominant vertebral arteries without high-grade stenosis.  Mild irregularity basilar artery without high-grade stenosis.  Nonvisualized anterior inferior cerebellar arteries.  Mild superior cerebellar arteries.  Mild posterior cerebral artery proximal and distal branch vessel irregularity and narrowing.  IMPRESSION: MRI HEAD FINDINGS  No acute infarct.  Tiny area of encephalomalacia posterior medial left parietal-occipital lobe.  Degenerative changes right C1- occiput articulation with fluid extending away from the joint.  MRA HEAD FINDINGS  Mild intracranial atherosclerotic type changes without medium or large size vessel significant stenosis occlusion.   Electronically Signed   By: Lacy Duverney M.D.   On: 02/27/2015 21:16    Cardiac Studies: Echo 02/27/15 Study Conclusions  -  Left ventricle: The cavity size was normal. There was mild concentric hypertrophy. Systolic function was normal. The estimated ejection fraction was in the range of 50% to 55%. Although no diagnostic regional wall motion abnormality was identified, this possibility cannot be completely excluded on the basis of this study. Doppler parameters are consistent with abnormal left ventricular relaxation (grade 1  diastolic dysfunction). - Aortic valve: Cusp separation was reduced. There was moderate to severe stenosis. Peak velocity (S): 392 cm/s. Mean gradient (S): 35 mm Hg. Valve area (VTI): 0.56 cm^2. Valve area (Vmax): 0.5 cm^2. Valve area (Vmean): 0.55 cm^2. Prior echo from earlier this month demonstrated severe aortic stenosis (mean gradient ).   Assessment/Plan:  54 y.o. disabled (hip DJD) male with history of severe aortic stenosis by recent echo, pulmonary embolism, recent polysubstance abuse, COPD and CAF on Xarelto (though he has not had any for weeks) and HTN who presented to the ED for episode of left sided facial droop and slurred speech, now resolved. Cardiology was consulted for elevated troponin.   Principal Problem:   TIA (transient ischemic attack) Active Problems:   NSTEMI (non-ST elevated myocardial infarction)   Severe aortic stenosis   SOB (shortness of breath)   Chronic atrial fibrillation   Benign essential HTN   Polysubstance abuse   Chronic anticoagulation-Xarelto   Non compliance w medication regimen   History of pulmonary embolism   Tobacco abuse   Chronic arthritis   PLAN: Cath today- he has not had any Xarelto for the past few weeks per pt.   Corine Shelter PA-C 02/28/2015, 11:44 AM [phone]  Patient seen and examined. Agree with assessment and plan. No further chest pain.  For R and L heart cath later today with exertional chest pain symptomology and severe AS. The risks and benefits of a cardiac catheterization including, but not limited to, death, stroke, MI, kidney damage and bleeding were discussed with the patient who indicates understanding and agrees to proceed.    Lennette Bihari, MD, [facility] 02/28/2015 2:13 PM

## 2015-02-28 NOTE — Progress Notes (Signed)
SLP Cancellation Note  Patient Details Name: JOHNWILLIAM SHEPPERSON MRN: [id number] DOB: 14-Jan-1961   Cancelled treatment:       Reason Eval/Treat Not Completed: SLP screened, no needs identified, will sign off.  MRI revealed no acute infarct or intracranial hemorrhage and patient reports that he is at his baseline.    Fae Pippin, M.A., CCC-SLP [phone]  Canyon Lohr 02/28/2015, 2:10 PM

## 2015-02-28 NOTE — Progress Notes (Signed)
CSW (Clinical Child psychotherapist) visited pt room to discuss living situation and pt also requesting CSW for unknown reason. Upon arrival, pt informed CSW he has had previous negative experience with social work and that he feels hospital social workers are unable to assist him. CSW asked pt what it is he would like assistance with. Pt wanting to receive treatment at [facility] but informed CSW he needs medical clearance to do so. Pt requesting CSW inform facility he is medically clear. CSW informed pt that CSW cannot do this but if pt signs release of information CSW can send current clinicals. Pt only wanting CSW to send "good things" and requesting CSW review information with pt prior to sending. CSW informed pt that CSW cannot do this and must provide facility with all information "good and bad". At this time, MD entered the room. CSW will revisit pt to see if pt would like CSW assistance. CSW unable to complete full assessment at this time or confirm pt current living situation.   Shantanu Strauch, LCSWA [phone]

## 2015-03-01 ENCOUNTER — Other Ambulatory Visit: Payer: Self-pay | Admitting: *Deleted

## 2015-03-01 ENCOUNTER — Observation Stay (HOSPITAL_COMMUNITY): Payer: Medicare HMO

## 2015-03-01 ENCOUNTER — Encounter (HOSPITAL_COMMUNITY): Payer: Self-pay | Admitting: Interventional Cardiology

## 2015-03-01 DIAGNOSIS — G458 Other transient cerebral ischemic attacks and related syndromes: Secondary | ICD-10-CM | POA: Diagnosis not present

## 2015-03-01 DIAGNOSIS — I639 Cerebral infarction, unspecified: Secondary | ICD-10-CM | POA: Insufficient documentation

## 2015-03-01 DIAGNOSIS — Z7901 Long term (current) use of anticoagulants: Secondary | ICD-10-CM

## 2015-03-01 DIAGNOSIS — G454 Transient global amnesia: Secondary | ICD-10-CM | POA: Diagnosis not present

## 2015-03-01 DIAGNOSIS — I10 Essential (primary) hypertension: Secondary | ICD-10-CM | POA: Diagnosis not present

## 2015-03-01 DIAGNOSIS — I482 Chronic atrial fibrillation: Secondary | ICD-10-CM | POA: Diagnosis not present

## 2015-03-01 DIAGNOSIS — I480 Paroxysmal atrial fibrillation: Secondary | ICD-10-CM | POA: Diagnosis not present

## 2015-03-01 DIAGNOSIS — I350 Nonrheumatic aortic (valve) stenosis: Secondary | ICD-10-CM | POA: Diagnosis not present

## 2015-03-01 LAB — CBC
HCT: 42.2 % (ref 39.0–52.0)
Hemoglobin: 13.9 g/dL (ref 13.0–17.0)
MCH: 29.1 pg (ref 26.0–34.0)
MCHC: 32.9 g/dL (ref 30.0–36.0)
MCV: 88.5 fL (ref 78.0–100.0)
PLATELETS: 162 10*3/uL (ref 150–400)
RBC: 4.77 MIL/uL (ref 4.22–5.81)
RDW: 14.3 % (ref 11.5–15.5)
WBC: 8.6 10*3/uL (ref 4.0–10.5)

## 2015-03-01 LAB — BASIC METABOLIC PANEL
Anion gap: 7 (ref 5–15)
BUN: 15 mg/dL (ref 6–20)
CALCIUM: 8.6 mg/dL — AB (ref 8.9–10.3)
CO2: 26 mmol/L (ref 22–32)
CREATININE: 1.04 mg/dL (ref 0.61–1.24)
Chloride: 107 mmol/L (ref 101–111)
GFR calc Af Amer: >60 mL/min (ref >60)
GFR calc non Af Amer: >60 mL/min (ref >60)
GLUCOSE: 98 mg/dL (ref 65–99)
Potassium: 4.2 mmol/L (ref 3.5–5.1)
Sodium: 140 mmol/L (ref 135–145)

## 2015-03-01 LAB — HEMOGLOBIN A1C
HEMOGLOBIN A1C: 5.9 % — AB (ref 4.8–5.6)
Mean Plasma Glucose: 123 mg/dL

## 2015-03-01 LAB — HEPARIN LEVEL (UNFRACTIONATED): Heparin Unfractionated: 0.32 IU/mL (ref 0.30–0.70)

## 2015-03-01 MED FILL — Heparin Sodium (Porcine) 2 Unit/ML in Sodium Chloride 0.9%: INTRAMUSCULAR | Qty: 500 | Status: AC

## 2015-03-01 NOTE — Progress Notes (Signed)
Pt educated and refused to have bed alarm placed on overnight. Will continue to monitor closely.

## 2015-03-01 NOTE — Consult Note (Signed)
[name]       [name] [address]             [phone]      Cardiothoracic Surgery History and Physical  Reason for Consult: Severe aortic stenosis Referring Physician: Dr. Shelva Majestic  Donald [name] is an 54 y.o. male.  HPI:   The patient is a 54 year old gentleman with a long history of smoking ( still 1ppd), prior ETOH abuse in AA, crack cocaine abuse, admitted with new onset of left facial droop and weakness, slurred speech, left arm weakness and numbness. It lasted about 20 seconds and resolved. CT of the brain in the ED was negative. MRA of the head showed mild intracranial atherosclerotic changes without significant vessel occlusion and no acute infarct. Carotid dopplers normal. He also has a history of pulmonary embolism treated at [facility] 2 months ago and says that he was on Xarelto for this as well as chronic atrial fibrillation on amiodarone but he moved and lost his medication so he was not taking the Xarelto. He was apparently in sinus rhythm when last seen in clinic 12/17/2014. He has a history of known aortic stenosis followed by Dr. Marijo File at [facility]. He was admitted to [facility] from 8/4-8/6 for acute encephalopathy felt to be due to polysubstance abuse with positive UDS for benzos and cocaine. His troponin was positive at 0.18>0.20. An echo on 02/03/2015 showed a mean AV gradient of 52 mm Hg with a peak of 81 mm Hg. The valve appeared trileaflet, calcified with poor leaflet mobility. The EF was 55%.   At the time of this admission his troponin POC was 0.29 with a Troponin I of 0.23. A repeat echo was performed on 02/27/2015 with poor visualization of the AV and a mean gradient of only 35 mm Hg. He underwent cath on 02/18/2015 that showed normal coronaries with a mean gradient of 47 mm Hg and a peak of 57 mm Hg with a CO of 5.7 L/min.   Past Medical History  Diagnosis Date  . Substance abuse   . A-fib   . Hypertension   . Arthritis   . COPD  (chronic obstructive pulmonary disease)     Past Surgical History  Procedure Laterality Date  . Tonsillectomy    . Cardiac catheterization N/A 02/28/2015    Procedure: Right/Left Heart Cath and Coronary Angiography;  Surgeon: Belva Crome, MD;  Location: [facility];  Service: Cardiovascular;  Laterality: N/A;    Family History  Problem Relation Age of Onset  . Family history unknown: Yes    Social History:  reports that he has been smoking Cigarettes.  He has a 88 pack-year smoking history. He does not have any smokeless tobacco history on file. He reports that he drinks alcohol. He reports that he uses illicit drugs (Cocaine) about 7 times per week.  Allergies: No Known Allergies  Medications:  I have reviewed the patient's current medications. Prior to Admission:  Prescriptions prior to admission  Medication Sig Dispense Refill Last Dose  . albuterol (PROVENTIL) (2.5 MG/3ML) 0.083% nebulizer solution Take 2.5 mg by nebulization every 6 (six) hours as needed for wheezing or shortness of breath.    Past Week at Unknown time  . amiodarone (PACERONE) 200 MG tablet Take 1 tablet (200 mg total) by mouth daily. For Atrial fibrillation/Hypertrophic Cardiomyopathy   Past Week at Unknown time  . budesonide-formoterol (SYMBICORT) 160-4.5 MCG/ACT inhaler Inhale 2  puffs into the lungs 2 (two) times daily.   Past Week at Unknown time  . COMBIVENT RESPIMAT 20-100 MCG/ACT AERS respimat Take 1 puff by mouth every 6 (six) hours as needed for wheezing or shortness of breath.    Past Week at Unknown time  . digoxin (LANOXIN) 0.25 MG tablet Take 0.25 mg by mouth daily.   Past Week at Unknown time  . XARELTO 20 MG TABS tablet Take 20 mg by mouth daily with supper.    Past Week at Unknown time   Scheduled: . amiodarone  200 mg Oral Daily  . aspirin  81 mg Oral Daily  . atorvastatin  80 mg Oral Q0600  . budesonide (PULMICORT) nebulizer solution  0.25 mg Nebulization BID  . guaiFENesin  600 mg  Oral BID  . Influenza vac split quadrivalent PF  0.5 mL Intramuscular Tomorrow-1000  . ipratropium-albuterol  3 mL Nebulization QID  . nicotine  14 mg Transdermal Daily  . sodium chloride  3 mL Intravenous Q12H   Continuous: . heparin 1,600 Units/hr (03/01/15 1555)   YIR:SWNIOE chloride, acetaminophen **OR** acetaminophen, ondansetron (ZOFRAN) IV, sodium chloride Anti-infectives    Start     Dose/Rate Route Frequency Ordered Stop   02/27/15 2200  doxycycline (VIBRA-TABS) tablet 100 mg  Status:  Discontinued     100 mg Oral Every 12 hours 02/27/15 1545 02/28/15 1144      Results for orders placed or performed during the hospital encounter of 02/27/15 (from the past 48 hour(s))  Troponin I (q 6hr x 3)     Status: Abnormal   Collection Time: 02/27/15 10:21 PM  Result Value Ref Range   Troponin I 0.10 (H) <0.031 ng/mL    Comment:        PERSISTENTLY INCREASED TROPONIN VALUES IN THE RANGE OF 0.04-0.49 ng/mL CAN BE SEEN IN:       -UNSTABLE ANGINA       -CONGESTIVE HEART FAILURE       -MYOCARDITIS       -CHEST TRAUMA       -ARRYHTHMIAS       -LATE PRESENTING MYOCARDIAL INFARCTION       -COPD   CLINICAL FOLLOW-UP RECOMMENDED.   Heparin level (unfractionated)     Status: Abnormal   Collection Time: 02/27/15 10:21 PM  Result Value Ref Range   Heparin Unfractionated <0.10 (L) 0.30 - 0.70 IU/mL    Comment:        IF HEPARIN RESULTS ARE BELOW EXPECTED VALUES, AND PATIENT DOSAGE HAS BEEN CONFIRMED, SUGGEST FOLLOW UP TESTING OF ANTITHROMBIN III LEVELS.   Troponin I (q 6hr x 3)     Status: Abnormal   Collection Time: 02/28/15  3:17 AM  Result Value Ref Range   Troponin I 0.10 (H) <0.031 ng/mL    Comment:        PERSISTENTLY INCREASED TROPONIN VALUES IN THE RANGE OF 0.04-0.49 ng/mL CAN BE SEEN IN:       -UNSTABLE ANGINA       -CONGESTIVE HEART FAILURE       -MYOCARDITIS       -CHEST TRAUMA       -ARRYHTHMIAS       -LATE PRESENTING MYOCARDIAL INFARCTION       -COPD    CLINICAL FOLLOW-UP RECOMMENDED.   Hemoglobin A1c     Status: Abnormal   Collection Time: 02/28/15  3:17 AM  Result Value Ref Range   Hgb A1c MFr Bld 5.9 (H) 4.8 - 5.6 %  Comment: (NOTE)         Pre-diabetes: 5.7 - 6.4         Diabetes: >6.4         Glycemic control for adults with diabetes: <7.0    Mean Plasma Glucose 123 mg/dL    Comment: (NOTE) Performed At: [facility] [place], [place] [id number] Lindon Romp MD HQ:[id number]   Heparin level (unfractionated)     Status: Abnormal   Collection Time: 02/28/15  3:17 AM  Result Value Ref Range   Heparin Unfractionated 0.28 (L) 0.30 - 0.70 IU/mL    Comment:        IF HEPARIN RESULTS ARE BELOW EXPECTED VALUES, AND PATIENT DOSAGE HAS BEEN CONFIRMED, SUGGEST FOLLOW UP TESTING OF ANTITHROMBIN III LEVELS.   CBC     Status: None   Collection Time: 02/28/15  3:17 AM  Result Value Ref Range   WBC 6.3 4.0 - 10.5 K/uL   RBC 5.17 4.22 - 5.81 MIL/uL   Hemoglobin 15.2 13.0 - 17.0 g/dL   HCT 45.2 39.0 - 52.0 %   MCV 87.4 78.0 - 100.0 fL   MCH 29.4 26.0 - 34.0 pg   MCHC 33.6 30.0 - 36.0 g/dL   RDW 13.9 11.5 - 15.5 %   Platelets 168 150 - 400 K/uL  Lipid panel     Status: Abnormal   Collection Time: 02/28/15  3:17 AM  Result Value Ref Range   Cholesterol 208 (H) 0 - 200 mg/dL   Triglycerides 98 <150 mg/dL   HDL 44 >40 mg/dL   Total CHOL/HDL Ratio 4.7 RATIO   VLDL 20 0 - 40 mg/dL   LDL Cholesterol 144 (H) 0 - 99 mg/dL    Comment:        Total Cholesterol/HDL:CHD Risk Coronary Heart Disease Risk Table                     Men   Women  1/2 Average Risk   3.4   3.3  Average Risk       5.0   4.4  2 X Average Risk   9.6   7.1  3 X Average Risk  23.4   11.0        Use the calculated Patient Ratio above and the CHD Risk Table to determine the patient's CHD Risk.        ATP III CLASSIFICATION (LDL):  <100     mg/dL   Optimal  100-129  mg/dL   Near or Above                    Optimal  130-159  mg/dL    Borderline  160-189  mg/dL   High  >190     mg/dL   Very High   Heparin level (unfractionated)     Status: None   Collection Time: 02/28/15 11:30 AM  Result Value Ref Range   Heparin Unfractionated 0.50 0.30 - 0.70 IU/mL    Comment:        IF HEPARIN RESULTS ARE BELOW EXPECTED VALUES, AND PATIENT DOSAGE HAS BEEN CONFIRMED, SUGGEST FOLLOW UP TESTING OF ANTITHROMBIN III LEVELS.   Heparin level (unfractionated)     Status: None   Collection Time: 02/28/15  4:25 PM  Result Value Ref Range   Heparin Unfractionated 0.37 0.30 - 0.70 IU/mL    Comment:        IF HEPARIN RESULTS ARE BELOW EXPECTED VALUES, AND PATIENT  DOSAGE HAS BEEN CONFIRMED, SUGGEST FOLLOW UP TESTING OF ANTITHROMBIN III LEVELS.   I-STAT 3, arterial blood gas (G3+)     Status: None   Collection Time: 02/28/15  5:11 PM  Result Value Ref Range   pH, Arterial 7.363 7.350 - 7.450   pCO2 arterial 40.7 35.0 - 45.0 mmHg   pO2, Arterial 94.0 80.0 - 100.0 mmHg   Bicarbonate 23.2 20.0 - 24.0 mEq/L   TCO2 24 0 - 100 mmol/L   O2 Saturation 97.0 %   Acid-base deficit 2.0 0.0 - 2.0 mmol/L   Sample type ARTERIAL   I-STAT 3, venous blood gas (G3P V)     Status: Abnormal   Collection Time: 02/28/15  5:15 PM  Result Value Ref Range   pH, Ven 7.357 (H) 7.250 - 7.300   pCO2, Ven 44.2 (L) 45.0 - 50.0 mmHg   pO2, Ven 41.0 30.0 - 45.0 mmHg   Bicarbonate 24.8 (H) 20.0 - 24.0 mEq/L   TCO2 26 0 - 100 mmol/L   O2 Saturation 73.0 %   Acid-base deficit 1.0 0.0 - 2.0 mmol/L   Sample type VENOUS   CBC     Status: None   Collection Time: 03/01/15  4:20 AM  Result Value Ref Range   WBC 8.6 4.0 - 10.5 K/uL   RBC 4.77 4.22 - 5.81 MIL/uL   Hemoglobin 13.9 13.0 - 17.0 g/dL   HCT 42.2 39.0 - 52.0 %   MCV 88.5 78.0 - 100.0 fL   MCH 29.1 26.0 - 34.0 pg   MCHC 32.9 30.0 - 36.0 g/dL   RDW 14.3 11.5 - 15.5 %   Platelets 162 150 - 400 K/uL  Basic metabolic panel     Status: Abnormal   Collection Time: 03/01/15  4:20 AM  Result Value Ref  Range   Sodium 140 135 - 145 mmol/L   Potassium 4.2 3.5 - 5.1 mmol/L   Chloride 107 101 - 111 mmol/L   CO2 26 22 - 32 mmol/L   Glucose, Bld 98 65 - 99 mg/dL   BUN 15 6 - 20 mg/dL   Creatinine, Ser 1.04 0.61 - 1.24 mg/dL   Calcium 8.6 (L) 8.9 - 10.3 mg/dL   GFR calc non Af Amer >60 >60 mL/min   GFR calc Af Amer >60 >60 mL/min    Comment: (NOTE) The eGFR has been calculated using the CKD EPI equation. This calculation has not been validated in all clinical situations. eGFR's persistently <60 mL/min signify possible Chronic Kidney Disease.    Anion gap 7 5 - 15  Heparin level (unfractionated)     Status: None   Collection Time: 03/01/15  9:37 AM  Result Value Ref Range   Heparin Unfractionated 0.32 0.30 - 0.70 IU/mL    Comment:        IF HEPARIN RESULTS ARE BELOW EXPECTED VALUES, AND PATIENT DOSAGE HAS BEEN CONFIRMED, SUGGEST FOLLOW UP TESTING OF ANTITHROMBIN III LEVELS.     Mr Virgel Paling Wo Contrast  02/27/2015   CLINICAL DATA:  54 year old hypertensive male with atrial fibrillation and history of substance abuse presenting with slurred speech and left arm numbness. Subsequent encounter.  EXAM: MRI HEAD WITHOUT CONTRAST  MRA HEAD WITHOUT CONTRAST  TECHNIQUE: Multiplanar, multiecho pulse sequences of the brain and surrounding structures were obtained without intravenous contrast. Angiographic images of the head were obtained using MRA technique without contrast.  COMPARISON:  02/27/2015 head CT.  No comparison brain MR.  FINDINGS: MRI HEAD FINDINGS  No  acute infarct.  No intracranial hemorrhage.  Tiny area of encephalomalacia posterior medial left parietal-occipital lobe.  No hydrocephalus.  No intracranial mass lesion noted on this unenhanced exam.  Degenerative changes right C1- occiput articulation with fluid extending away from the joint.  Partially empty non expanded sella.  Cervical medullary junction pineal region unremarkable. Orbital structures within normal limits.  MRA HEAD  FINDINGS  Anterior circulation without medium or large size vessel significant stenosis or occlusion. Mild narrowing right middle cerebral artery bifurcation. Mild middle cerebral artery branch vessel irregularity bilaterally.  Codominant vertebral arteries without high-grade stenosis.  Mild irregularity basilar artery without high-grade stenosis.  Nonvisualized anterior inferior cerebellar arteries.  Mild superior cerebellar arteries.  Mild posterior cerebral artery proximal and distal branch vessel irregularity and narrowing.  IMPRESSION: MRI HEAD FINDINGS  No acute infarct.  Tiny area of encephalomalacia posterior medial left parietal-occipital lobe.  Degenerative changes right C1- occiput articulation with fluid extending away from the joint.  MRA HEAD FINDINGS  Mild intracranial atherosclerotic type changes without medium or large size vessel significant stenosis occlusion.   Electronically Signed   By: Genia Del M.D.   On: 02/27/2015 21:16   Mr Brain Wo Contrast  02/27/2015   CLINICAL DATA:  53 year old hypertensive male with atrial fibrillation and history of substance abuse presenting with slurred speech and left arm numbness. Subsequent encounter.  EXAM: MRI HEAD WITHOUT CONTRAST  MRA HEAD WITHOUT CONTRAST  TECHNIQUE: Multiplanar, multiecho pulse sequences of the brain and surrounding structures were obtained without intravenous contrast. Angiographic images of the head were obtained using MRA technique without contrast.  COMPARISON:  02/27/2015 head CT.  No comparison brain MR.  FINDINGS: MRI HEAD FINDINGS  No acute infarct.  No intracranial hemorrhage.  Tiny area of encephalomalacia posterior medial left parietal-occipital lobe.  No hydrocephalus.  No intracranial mass lesion noted on this unenhanced exam.  Degenerative changes right C1- occiput articulation with fluid extending away from the joint.  Partially empty non expanded sella.  Cervical medullary junction pineal region unremarkable.  Orbital structures within normal limits.  MRA HEAD FINDINGS  Anterior circulation without medium or large size vessel significant stenosis or occlusion. Mild narrowing right middle cerebral artery bifurcation. Mild middle cerebral artery branch vessel irregularity bilaterally.  Codominant vertebral arteries without high-grade stenosis.  Mild irregularity basilar artery without high-grade stenosis.  Nonvisualized anterior inferior cerebellar arteries.  Mild superior cerebellar arteries.  Mild posterior cerebral artery proximal and distal branch vessel irregularity and narrowing.  IMPRESSION: MRI HEAD FINDINGS  No acute infarct.  Tiny area of encephalomalacia posterior medial left parietal-occipital lobe.  Degenerative changes right C1- occiput articulation with fluid extending away from the joint.  MRA HEAD FINDINGS  Mild intracranial atherosclerotic type changes without medium or large size vessel significant stenosis occlusion.   Electronically Signed   By: Genia Del M.D.   On: 02/27/2015 21:16    Review of Systems  Constitutional: Positive for malaise/fatigue. Negative for fever and chills.  HENT:       Bad teeth  Eyes: Negative.   Respiratory: Positive for shortness of breath. Negative for cough, hemoptysis and sputum production.   Cardiovascular: Positive for chest pain. Negative for orthopnea, leg swelling and PND.  Gastrointestinal: Negative.   Genitourinary: Negative.   Musculoskeletal: Negative.   Skin: Negative.   Neurological: Positive for dizziness.       Transient left facial weakness, slurred speech and left upper extremity weakness and numbness.  Endo/Heme/Allergies: Negative.   Psychiatric/Behavioral: Positive for  substance abuse.   Blood pressure 142/90, pulse 81, temperature 97.8 F (36.6 C), temperature source Oral, resp. rate 25, height 5' 11" (1.803 m), weight 94.53 kg (208 lb 6.4 oz), SpO2 97 %. Physical Exam  Constitutional: He is oriented to person, place, and time.  He appears well-developed and well-nourished. He appears distressed.  HENT:  Head: Normocephalic and atraumatic.  Poor dentition with multiple missing upper teeth.  Eyes: EOM are normal. Pupils are equal, round, and reactive to light.  Neck: Normal range of motion. Neck supple. No JVD present. No thyromegaly present.  Cardiovascular: Normal rate, regular rhythm and intact distal pulses.   Murmur heard. 3/6 systolic murmur along RSB  Respiratory: Effort normal and breath sounds normal. No respiratory distress. He has no rales.  GI: Soft. Bowel sounds are normal. He exhibits no distension and no mass. There is no tenderness.  Midline diastasis recti  Musculoskeletal: Normal range of motion. He exhibits no edema or tenderness.  Lymphadenopathy:    He has no cervical adenopathy.  Neurological: He is alert and oriented to person, place, and time. He has normal strength. No cranial nerve deficit or sensory deficit.  Skin: Skin is warm and dry.  Psychiatric: He has a normal mood and affect.                 *[facility] [employer].            [place], [place] [address]              [phone]  ------------------------------------------------------------------- Transthoracic Echocardiography  Patient:  Jaydrien, Wassenaar MR #:    [id number] Study Date: 02/03/2015 Gender:   M Age:    49 Height:   180.3 cm Weight:   90.3 kg BSA:    2.14 m^2 Pt. Status: Room:    1429  ADMITTING  Linward Foster REFERRING  Rise Patience SONOGRAPHER [address] ATTENDING  Tanna Furry [id number] PERFORMING  [facility], Inpatient  cc:  ------------------------------------------------------------------- LV EF: 55%  ------------------------------------------------------------------- Indications:   Dyspnea  786.09.  ------------------------------------------------------------------- History:  PMH: Aortic Stenosis, Cocaine Abuse, History of Pulmonary Embolism, ETOH. Atrial fibrillation. Angina pectoris. Risk factors: Current tobacco use.  ------------------------------------------------------------------- Study Conclusions  - Left ventricle: The cavity size was normal. Wall thickness was increased in a pattern of mild LVH. The estimated ejection fraction was 55%. Although no diagnostic regional wall motion abnormality was identified, this possibility cannot be completely excluded on the basis of this study. Doppler parameters are consistent with abnormal left ventricular relaxation (grade 1 diastolic dysfunction). Septal thickness, ED (2D): 13 mm. - Aortic valve: Probably trileaflet; severely calcified leaflets. There was severe stenosis. Mean gradient (S): 52 mm Hg. Peak gradient (S): 81 mm Hg. Valve area (VTI): 0.54 cm^2. Valve area (Vmax): 0.53 cm^2. - Mitral valve: There was trivial regurgitation. - Left atrium: The atrium was mildly dilated. - Right ventricle: The cavity size was normal. Systolic function was normal. - Pulmonary arteries: No complete TR doppler jet so unable to estimate PA systolic pressure. - Systemic veins: IVC was poorly visualized.  Impressions:  - Normal LV size with mild LV hypertrophy. EF 55%. Normal RV size and systolic function. Severe aortic stenosis.  Transthoracic echocardiography. M-mode, complete 2D, spectral Doppler, and color Doppler. Birthdate: Patient birthdate: 11-Jan-1961. Age: Patient is 54  yr old. Sex: Gender: male. BMI: 27.8 kg/m^2. Blood pressure:   120/74 Patient status: Inpatient. Study date: Study date: 02/03/2015. Study time: 12:53 PM. Location:  Bedside.  -------------------------------------------------------------------  ------------------------------------------------------------------- Left ventricle: The cavity size was normal. Wall thickness was increased in a pattern of mild LVH. The estimated ejection fraction was 55%. Although no diagnostic regional wall motion abnormality was identified, this possibility cannot be completely excluded on the basis of this study. Doppler parameters are consistent with abnormal left ventricular relaxation (grade 1 diastolic dysfunction).  ------------------------------------------------------------------- Aortic valve:  Probably trileaflet; severely calcified leaflets. Doppler:  There was severe stenosis.  There was no significant regurgitation.  VTI ratio of LVOT to aortic valve: 0.17. Valve area (VTI): 0.54 cm^2. Indexed valve area (VTI): 0.25 cm^2/m^2. Peak velocity ratio of LVOT to aortic valve: 0.17. Valve area (Vmax): 0.53 cm^2. Indexed valve area (Vmax): 0.25 cm^2/m^2. Mean velocity ratio of LVOT to aortic valve: 0.15. Indexed valve area (Vmean): 0.21 cm^2/m^2.  Mean gradient (S): 52 mm Hg. Peak gradient (S): 81 mm Hg.  ------------------------------------------------------------------- Aorta: Aortic root: The aortic root was normal in size. Ascending aorta: The ascending aorta was normal in size.  ------------------------------------------------------------------- Mitral valve:  Normal thickness leaflets . Doppler:  There was no evidence for stenosis.  There was trivial regurgitation.  ------------------------------------------------------------------- Left atrium: The atrium was mildly dilated.  ------------------------------------------------------------------- Right ventricle: The cavity size was normal. Systolic function was normal.  ------------------------------------------------------------------- Pulmonic valve:  Structurally normal valve.   Cusp separation was normal. Doppler: Transvalvular velocity was within the normal range. There was no regurgitation.  ------------------------------------------------------------------- Tricuspid valve:  Doppler: There was no significant regurgitation.  ------------------------------------------------------------------- Pulmonary artery:  No complete TR doppler jet so unable to estimate PA systolic pressure.  ------------------------------------------------------------------- Right atrium: The atrium was normal in size.  ------------------------------------------------------------------- Pericardium: There was no pericardial effusion.  ------------------------------------------------------------------- Systemic veins: IVC was poorly visualized.  ------------------------------------------------------------------- Measurements  Left ventricle              Value     Reference LV ID, ED, PLAX chordal          47.8 mm    43 - 52 LV ID, ES, PLAX chordal          35.8 mm    23 - 38 LV fx shortening, PLAX chordal  (L)   25  %    >=29 Stroke volume, 2D             54  ml    --------- Stroke volume/bsa, 2D           25  ml/m^2  --------- LV e&', lateral              6.64 cm/s   --------- LV E/e&', lateral             8.84      --------- LV e&', medial               3.48 cm/s   --------- LV E/e&', medial              16.87     --------- LV e&', average              5.06 cm/s   --------- LV E/e&', average             11.6      ---------  Ventricular septum            Value     Reference IVS  thickness, ED             11.5 mm    ---------  LVOT                   Value     Reference LVOT ID, S                 20  mm    --------- LVOT area                 3.14 cm^2   --------- LVOT peak velocity, S           76.8 cm/s   --------- LVOT mean velocity, S           48.7 cm/s   --------- LVOT VTI, S                17.1 cm    ---------  Aortic valve               Value     Reference Aortic valve peak velocity, S       451  cm/s   --------- Aortic valve mean velocity, S       335  cm/s   --------- Aortic valve VTI, S            99.9 cm    --------- Aortic mean gradient, S          52  mm Hg  --------- Aortic peak gradient, S          81  mm Hg  --------- VTI ratio, LVOT/AV            0.17      --------- Aortic valve area, VTI          0.54 cm^2   --------- Aortic valve area/bsa, VTI        0.25 cm^2/m^2 --------- Velocity ratio, peak, LVOT/AV       0.17      --------- Aortic valve area, peak velocity     0.53 cm^2   --------- Aortic valve area/bsa, peak        0.25 cm^2/m^2 --------- velocity Velocity ratio, mean, LVOT/AV       0.15      --------- Aortic valve area/bsa, mean        0.21 cm^2/m^2 --------- velocity  Aorta                   Value     Reference Aortic root ID, ED            32  mm    ---------  Left atrium                Value     Reference LA ID, A-P, ES              44  mm    --------- LA ID/bsa, A-P              2.05 cm/m^2  <=2.2 LA volume, S               67.4 ml    --------- LA volume/bsa, S             31.5 ml/m^2  --------- LA volume, ES, 1-p A4C          53.5 ml    --------- LA volume/bsa, ES, 1-p A4C        25  ml/m^2  --------- LA  volume, ES, 1-p A2C           82.3 ml    --------- LA volume/bsa, ES, 1-p A2C        38.4 ml/m^2  ---------  Mitral valve               Value     Reference Mitral E-wave peak velocity        58.7 cm/s   --------- Mitral A-wave peak velocity        62.1 cm/s   --------- Mitral deceleration time     (H)   275  ms    150 - 230 Mitral E/A ratio, peak          0.9      ---------  Systemic veins              Value     Reference Estimated CVP               3   mm Hg  ---------  Right ventricle              Value     Reference TAPSE                   25.4 mm    ---------  Legend: (L) and (H) mark values outside specified reference range.  ------------------------------------------------------------------- Prepared and Electronically Authenticated by  Loralie Champagne, M.D. 2016-08-05T18:23:04  Cardiac Cath:  Conclusion     Normal coronary arteries  Severe aortic stenosis with a calculated aortic valve area of 0.74 cm based upon a peak transaortic valve gradiant of 57 mmHg and mean pressure of 47 mmHg. Cardiac output was 5.7 L/m.  Normal left ventricular systolic function. Left ventricular end-diastolic pressure is also normal.  Normal pulmonary artery pressures.    RECOMMENDATIONS:    Per treating team     Coronary Findings    Dominance: Co-dominant   Left Anterior Descending   . First Diagonal Branch   The vessel is moderate in size.   Marland Kitchen Second Diagonal Branch   The vessel is small in size.   . Third Diagonal Branch   The vessel is small in size.       Right Heart Pressures She was performed via the right antecubital vein.    Wall Motion                 Left Heart    Left Ventricle The left ventricular ejection fraction is 55-65% by visual estimate.    Coronary Diagrams    Diagnostic  Diagram            Implants    Name ID Temporary Type Supply   No information to display    Hemo Data       Most Recent Value   Fick Cardiac Output  5.74 L/min   Fick Cardiac Output Index  2.68 (L/min)/BSA   Aortic Mean Gradient  46.6 mmHg   Aortic Peak Gradient  57 mmHg   Aortic Valve Area  0.74   Aortic Value Area Index  0.35 cm2/BSA   RA A Wave  4 mmHg   RA V Wave  2 mmHg   RA Mean  2 mmHg   RV Systolic Pressure  33 mmHg   RV Diastolic Pressure  2 mmHg   RV EDP  4 mmHg   PA Systolic Pressure  31 mmHg   PA Diastolic Pressure  8 mmHg   PA Mean  20  mmHg   PW A Wave  11 mmHg   PW V Wave  9 mmHg   PW Mean  8 mmHg   AO Systolic Pressure  161 mmHg   AO Diastolic Pressure  72 mmHg   AO Mean  88 mmHg   LV Systolic Pressure  096 mmHg   LV Diastolic Pressure  10 mmHg   LV EDP  13 mmHg   Arterial Occlusion Pressure Extended Systolic Pressure  045 mmHg   Arterial Occlusion Pressure Extended Diastolic Pressure  79 mmHg   Arterial Occlusion Pressure Extended Mean Pressure  99 mmHg   Left Ventricular Apex Extended Systolic Pressure  409 mmHg   Left Ventricular Apex Extended Diastolic Pressure  8 mmHg   Left Ventricular Apex Extended EDP Pressure  11 mmHg   QP/QS  1   TPVR Index  7.46 HRUI   TSVR Index  36.19 HRUI   PVR SVR Ratio  0.13   TPVR/TSVR Ratio  0.21    Order-Level Documents - 02/27/15:      Scan on 02/28/2015 2:47 PM by Provider Default, MDScan on 02/28/2015 2:47 PM by Provider Default, MD    Encounter-Level Documents - 02/27/15:      Scan on 02/28/2015 5:38 PM by Provider Default, MDScan on 02/28/2015 5:38 PM by Provider Default, MD     Scan on 03/01/2015 9:04 AM by Provider Default, MDScan on 03/01/2015 9:04 AM by Provider Default, MD     Electronic signature on 02/27/2015 11:17 AM     Electronic signature on 02/27/2015 11:17 AM    Signed    Electronically signed by Belva Crome, MD on 02/28/15 at 1755 EDT     Assessment/Plan:  He has stage D severe symptomatic aortic stenosis with normal coronaries and LV function. I have personally reviewed his cath and echos. 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 This valve requires replacement. He has poor dentition and I suspect that he needs extractions. I will consult dentistry and get Panorex. He is only 54 years old but has a long history of polysubstance abuse and noncompliance and is essentially homeless so I think a tissue valve would be the best option for him. I discussed this with him including the risk of structural valve deterioration requiring prosthetic valve replacement and he understands and agrees. The etiology of his TIA is unclear. He has been in sinus rhythm on ECG done in early August and during this admission. There was no intracardiac clot seen on echo. He could have flicked something off his valve or aorta and he does have some intracranial atherosclerosis. With a prosthetic valve in place he would have to be on coumadin or aspirin or aspirin/Plavix. After he is seen by dentistry and has dental work as needed I will decide on the timing of surgery.   Gaye Pollack 03/01/2015, 6:35 PM

## 2015-03-01 NOTE — Progress Notes (Signed)
Patient Name: Donald [name] Date of Encounter: 03/01/2015     Principal Problem:   TIA (transient ischemic attack) Active Problems:   SOB (shortness of breath)   History of pulmonary embolism   Chronic atrial fibrillation   Tobacco abuse   Chronic arthritis   Benign essential HTN   NSTEMI (non-ST elevated myocardial infarction)   Severe aortic stenosis   Polysubstance abuse   Chronic anticoagulation-Xarelto   Non compliance w medication regimen    SUBJECTIVE   He would like to discuss options for treatment of his AS. He is currently asymptomatic. No s/s CHF and no CP/SOB. He was living at a half way house and has been sober for 3 weeks but plans to be homeless after he leaves the hospital.   CURRENT MEDS . amiodarone  200 mg Oral Daily  . aspirin  81 mg Oral Daily  . atorvastatin  80 mg Oral Q0600  . budesonide (PULMICORT) nebulizer solution  0.25 mg Nebulization BID  . guaiFENesin  600 mg Oral BID  . Influenza vac split quadrivalent PF  0.5 mL Intramuscular Tomorrow-1000  . ipratropium-albuterol  3 mL Nebulization QID  . nicotine  14 mg Transdermal Daily  . sodium chloride  3 mL Intravenous Q12H    OBJECTIVE  Filed Vitals:   02/28/15 2211 02/28/15 2243 02/28/15 2325 03/01/15 0500  BP: 138/94 127/84 144/90 142/90  Pulse:    81  Temp:   98.2 F (36.8 C) 97.8 F (36.6 C)  TempSrc:   Oral Oral  Resp:      Height:      Weight:    208 lb 6.4 oz (94.53 kg)  SpO2: 94% 95% 97% 98%    Intake/Output Summary (Last 24 hours) at 03/01/15 0954 Last data filed at 03/01/15 1610  Gross per 24 hour  Intake   1200 ml  Output    550 ml  Net    650 ml   Filed Weights   02/27/15 1550 02/28/15 0601 03/01/15 0500  Weight: 206 lb 14.4 oz (93.849 kg) 206 lb (93.441 kg) 208 lb 6.4 oz (94.53 kg)    PHYSICAL EXAM  General: Pleasant, NAD. Neuro: Alert and oriented X 3. Moves all extremities spontaneously. Psych: Normal affect. HEENT:  Normal  Neck: Supple without bruits  or JVD. Lungs:  Resp regular and unlabored, CTA. Heart: RRR no s3, s4, SEM at RUSB. Abdomen: Soft, non-tender, non-distended, BS + x 4.  Extremities: No clubbing, cyanosis or edema. DP/PT/Radials 2+ and equal bilaterally.  Accessory Clinical Findings  CBC  Recent Labs  02/27/15 1044 02/28/15 0317 03/01/15 0420  WBC 7.7 6.3 8.6  NEUTROABS 4.7  --   --   HGB 16.4 15.2 13.9  HCT 49.0 45.2 42.2  MCV 88.6 87.4 88.5  PLT 188 168 162   Basic Metabolic Panel  Recent Labs  02/27/15 1044 03/01/15 0420  NA 140 140  K 4.3 4.2  CL 106 107  CO2 29 26  GLUCOSE 106* 98  BUN 17 15  CREATININE 1.27* 1.04  CALCIUM 9.4 8.6*   Liver Function Tests  Recent Labs  02/27/15 1044  AST 16  ALT 15*  ALKPHOS 84  BILITOT 0.9  PROT 6.7  ALBUMIN 3.7   No results for input(s): LIPASE, AMYLASE in the last 72 hours. Cardiac Enzymes  Recent Labs  02/27/15 1634 02/27/15 2221 02/28/15 0317  TROPONINI 0.18* 0.10* 0.10*   BNP Invalid input(s): POCBNP D-Dimer No results for input(s): DDIMER in the  last 72 hours. Hemoglobin A1C  Recent Labs  02/28/15 0317  HGBA1C 5.9*   Fasting Lipid Panel  Recent Labs  02/28/15 0317  CHOL 208*  HDL 44  LDLCALC 144*  TRIG 98  CHOLHDL 4.7   Thyroid Function Tests  Recent Labs  02/27/15 1634  TSH 0.477    TELE  NSR   Radiology/Studies  Dg Chest 2 View  02/27/2015   CLINICAL DATA:  Left upper extremity region pain. Atrial fibrillation. Hypertension.  EXAM: CHEST  2 VIEW  COMPARISON:  Chest radiograph and chest CT February 02, 2015  FINDINGS: There is a small nipple shadow on the right. There is no edema or consolidation. The heart size and pulmonary vascularity normal. No adenopathy. No pneumothorax. There old rib fractures on the left, stable.  IMPRESSION: Evidence of old rib trauma on the left. No edema or consolidation. No pneumothorax.   Electronically Signed   By: Bretta Bang III M.D.   On: 02/27/2015 12:20   Ct Head Wo  Contrast  02/27/2015   CLINICAL DATA:  Slurred speech.  Left arm pain.  EXAM: CT HEAD WITHOUT CONTRAST  TECHNIQUE: Contiguous axial images were obtained from the base of the skull through the vertex without intravenous contrast.  COMPARISON:  02/02/2015, 10/19/2013  FINDINGS: There is no evidence of mass effect, midline shift or extra-axial fluid collections. There is no evidence of a space-occupying lesion or intracranial hemorrhage. There is no evidence of a cortical-based area of acute infarction.  The ventricles and sulci are appropriate for the patient's age. The basal cisterns are patent.  Visualized portions of the orbits are unremarkable. The visualized portions of the paranasal sinuses and mastoid air cells are unremarkable.  The osseous structures are unremarkable.  IMPRESSION: Normal CT of the brain without intravenous contrast.   Electronically Signed   By: Elige Ko   On: 02/27/2015 12:08   Ct Head Wo Contrast  02/03/2015   CLINICAL DATA:  Acute onset of shortness of breath and agitation. Acute encephalopathy. Recently smoked crack. Initial encounter.  EXAM: CT HEAD WITHOUT CONTRAST  TECHNIQUE: Contiguous axial images were obtained from the base of the skull through the vertex without intravenous contrast.  COMPARISON:  CT of the head performed 10/19/2013  FINDINGS: There is no evidence of acute infarction, mass lesion, or intra- or extra-axial hemorrhage on CT.  The posterior fossa, including the cerebellum, brainstem and fourth ventricle, is within normal limits. The third and lateral ventricles, and basal ganglia are unremarkable in appearance. The cerebral hemispheres are symmetric in appearance, with normal gray-white differentiation. No mass effect or midline shift is seen.  There is no evidence of fracture; visualized osseous structures are unremarkable in appearance. The orbits are within normal limits. The paranasal sinuses and mastoid air cells are well-aerated. No significant soft  tissue abnormalities are seen.  IMPRESSION: Unremarkable noncontrast CT of the head.   Electronically Signed   By: Roanna Raider M.D.   On: 02/03/2015 00:16   Ct Angio Chest Pe W/cm &/or Wo Cm  02/02/2015   CLINICAL DATA:  Dyspnea and agitation.  EXAM: CT ANGIOGRAPHY CHEST WITH CONTRAST  TECHNIQUE: Multidetector CT imaging of the chest was performed using the standard protocol during bolus administration of intravenous contrast. Multiplanar CT image reconstructions and MIPs were obtained to evaluate the vascular anatomy.  CONTRAST:  OMNIPAQUE IOHEXOL 350 MG/ML SOLN  COMPARISON:  02/26/2013, 11/24/2014  FINDINGS: Cardiovascular: There is good opacification of the pulmonary arteries. There is no pulmonary  embolism. The thoracic aorta is normal in caliber and intact.  Lungs: There is a 2.2 cm circumscribed ovoid mass at the diaphragmatic surface of the right base. On coronal and sagittal reconstructions it appears to contain fat. This is unchanged from 02/26/2013 and most likely is benign. The lungs are otherwise clear.  Central airways: Patent  Effusions: None  Lymphadenopathy: None  Esophagus: Unremarkable  Upper abdomen: No significant abnormality no significant abnormality.  Musculoskeletal: None.  Review of the MIP images confirms the above findings.  IMPRESSION: 1. Negative for pulmonary embolus 2. No acute findings are evident in the chest. 3. Benign appearing 2.2 cm circumscribed pleural based mass of the right diaphragmatic surface, unchanged from 02/26/2013. It appears to contain fat. No specific follow-up is necessary.   Electronically Signed   By: Ellery Plunk M.D.   On: 02/02/2015 22:28   Mr Shirlee Latch Wo Contrast  02/27/2015   CLINICAL DATA:  54 year old hypertensive male with atrial fibrillation and history of substance abuse presenting with slurred speech and left arm numbness. Subsequent encounter.  EXAM: MRI HEAD WITHOUT CONTRAST  MRA HEAD WITHOUT CONTRAST  TECHNIQUE: Multiplanar,  multiecho pulse sequences of the brain and surrounding structures were obtained without intravenous contrast. Angiographic images of the head were obtained using MRA technique without contrast.  COMPARISON:  02/27/2015 head CT.  No comparison brain MR.  FINDINGS: MRI HEAD FINDINGS  No acute infarct.  No intracranial hemorrhage.  Tiny area of encephalomalacia posterior medial left parietal-occipital lobe.  No hydrocephalus.  No intracranial mass lesion noted on this unenhanced exam.  Degenerative changes right C1- occiput articulation with fluid extending away from the joint.  Partially empty non expanded sella.  Cervical medullary junction pineal region unremarkable. Orbital structures within normal limits.  MRA HEAD FINDINGS  Anterior circulation without medium or large size vessel significant stenosis or occlusion. Mild narrowing right middle cerebral artery bifurcation. Mild middle cerebral artery branch vessel irregularity bilaterally.  Codominant vertebral arteries without high-grade stenosis.  Mild irregularity basilar artery without high-grade stenosis.  Nonvisualized anterior inferior cerebellar arteries.  Mild superior cerebellar arteries.  Mild posterior cerebral artery proximal and distal branch vessel irregularity and narrowing.  IMPRESSION: MRI HEAD FINDINGS  No acute infarct.  Tiny area of encephalomalacia posterior medial left parietal-occipital lobe.  Degenerative changes right C1- occiput articulation with fluid extending away from the joint.  MRA HEAD FINDINGS  Mild intracranial atherosclerotic type changes without medium or large size vessel significant stenosis occlusion.   Electronically Signed   By: Lacy Duverney M.D.   On: 02/27/2015 21:16   Mr Brain Wo Contrast  02/27/2015   CLINICAL DATA:  54 year old hypertensive male with atrial fibrillation and history of substance abuse presenting with slurred speech and left arm numbness. Subsequent encounter.  EXAM: MRI HEAD WITHOUT CONTRAST  MRA  HEAD WITHOUT CONTRAST  TECHNIQUE: Multiplanar, multiecho pulse sequences of the brain and surrounding structures were obtained without intravenous contrast. Angiographic images of the head were obtained using MRA technique without contrast.  COMPARISON:  02/27/2015 head CT.  No comparison brain MR.  FINDINGS: MRI HEAD FINDINGS  No acute infarct.  No intracranial hemorrhage.  Tiny area of encephalomalacia posterior medial left parietal-occipital lobe.  No hydrocephalus.  No intracranial mass lesion noted on this unenhanced exam.  Degenerative changes right C1- occiput articulation with fluid extending away from the joint.  Partially empty non expanded sella.  Cervical medullary junction pineal region unremarkable. Orbital structures within normal limits.  MRA HEAD FINDINGS  Anterior circulation without medium or large size vessel significant stenosis or occlusion. Mild narrowing right middle cerebral artery bifurcation. Mild middle cerebral artery branch vessel irregularity bilaterally.  Codominant vertebral arteries without high-grade stenosis.  Mild irregularity basilar artery without high-grade stenosis.  Nonvisualized anterior inferior cerebellar arteries.  Mild superior cerebellar arteries.  Mild posterior cerebral artery proximal and distal branch vessel irregularity and narrowing.  IMPRESSION: MRI HEAD FINDINGS  No acute infarct.  Tiny area of encephalomalacia posterior medial left parietal-occipital lobe.  Degenerative changes right C1- occiput articulation with fluid extending away from the joint.  MRA HEAD FINDINGS  Mild intracranial atherosclerotic type changes without medium or large size vessel significant stenosis occlusion.   Electronically Signed   By: Lacy Duverney M.D.   On: 02/27/2015 21:16   Dg Chest Port 1 View  02/02/2015   CLINICAL DATA:  Shortness of breath  EXAM: PORTABLE CHEST - 1 VIEW  COMPARISON:  01/14/2015  FINDINGS: Diffuse interstitial opacity with cephalized blood flow. No  interstitial edema or effusion. No collapse or consolidation. Normal heart size and mediastinal contours.  IMPRESSION: Pulmonary venous congestion.   Electronically Signed   By: Marnee Spring M.D.   On: 02/02/2015 18:28    Conclusion     Normal coronary arteries  Severe aortic stenosis with a calculated aortic valve area of 0.74 cm based upon a peak transaortic valve gradiant of 57 mmHg and mean pressure of 47 mmHg. Cardiac output was 5.7 L/m.  Normal left ventricular systolic function. Left ventricular end-diastolic pressure is also normal.  Normal pulmonary artery pressures. RECOMMENDATIONS:   Per treating team     ASSESSMENT AND PLAN  54 y.o. disabled (hip DJD) male with history of severe aortic stenosis by recent echo, pulmonary embolism, recent polysubstance abuse, COPD, PAF on Xarelto (though he has not had any for weeks) and HTN who presented to the ED for episode of left sided facial droop and slurred speech, now resolved. Cardiology was consulted for elevated troponin.   TIA- sx resolved. Per primary team.   NSTEMI -- pk troponin 0.29 -- s/p LHC on 02/28/15 which revealed normal coronary arteries and normal LVEF. It did show severe AS.   Severe aortic stenosis- LHC with severe aortic stenosis with a calculated aortic valve area of 0.74 cm based upon a peak transaortic valve gradiant of 57 mmHg and mean pressure of 47 mmHg. Cardiac output was 5.7 L/m. -- He would like to discuss options for treatment of his AS. He is currently asymptomatic. No s/s CHF and no CP/SOB  Chronic Atrial Fibrillation - CHADSVASCs score of 1 (HTN). He is on Xarelto for afib and PE by his cardiologist. Encouraged compliance of Xarelto. Held for cath - He has been treated with amiodarone and is maintaining NSR - His digoxin was discontinued as this might making AS worse.  - Currently on heparin, but should resume Xarelto on discharge ( He was non complaint before)   HTN- not currently being  treated.   Polysubstance abuse- last admission he was positive for benzos and cocaine. UDS negative this admission. He has been sober for 3 weeks. He was living in a half way house but he will not be going back there and plans to be homeless.    History of pulmonary embolism- cont xarelto  Tobacco abuse- counseled on cessation    Signed, Janetta Hora PA-C  Pager [phone]  Patient seen and examined. Agree with assessment and plan. Severe AS noted on cath without CAD.  Discussed options with patient.  Will obtain cardiac surgical consultation for consideration of AVR. No chest pain, no syncope. He has dyspnea on exertion. Social issues a concern.   Lennette Bihari, MD, [facility] 03/01/2015 12:21 PM

## 2015-03-01 NOTE — Progress Notes (Signed)
Pt called RN stating that he felt short of breath and needed a breathing treatment. Pt had refused scheduled pulmicort and duoneb at 1956 last night, however pt states that he does not remember refusing these treatments. Spoke with Craige Cotta, NP about pt's request for a PRN treatment. Orders that pt could wait until his scheduled 8 am treatment unless he was in distress, dropping his O2 sats, or wheezing in which case he could receive his scheduled duoneb early. Pt was notified of this and became very upset and verbally abusive towards RN, continuing to state that he needed a breathing treatment right now. Pt's O2 sats 98% on room air with no signs of respiratory distress. RN did hear some expiratory wheezes in pt's lung bases, so the respiratory therapist was asked to administer pt's scheduled duoneb early. Will continue to monitor pt.

## 2015-03-01 NOTE — Progress Notes (Signed)
ANTICOAGULATION CONSULT NOTE - Follow Up Consult  Pharmacy Consult for heparin Indication: chest pain/ACS  No Known Allergies  Patient Measurements: Height:  (180.3 cm) Weight: 208 lb 6.4 oz (94.53 kg) IBW/kg (Calculated) : 75.3 Heparin Dosing Weight: 93 kg  Vital Signs: Temp: 97.8 F (36.6 C) (08/31 0500) Temp Source: Oral (08/31 0500) BP: 142/90 mmHg (08/31 0500) Pulse Rate: 81 (08/31 0500)  Labs:  Recent Labs  02/27/15 1044 02/27/15 1634  02/27/15 2221 02/28/15 0317 02/28/15 1130 02/28/15 1625 03/01/15 0420 03/01/15 0937  HGB 16.4  --   --   --  15.2  --   --  13.9  --   HCT 49.0  --   --   --  45.2  --   --  42.2  --   PLT 188  --   --   --  168  --   --  162  --   APTT 33  --   --   --   --   --   --   --   --   LABPROT 14.0  --   --   --   --   --   --   --   --   INR 1.06  --   --   --   --   --   --   --   --   HEPARINUNFRC  --   --   < > <0.10* 0.28* 0.50 0.37  --  0.32  CREATININE 1.27*  --   --   --   --   --   --  1.04  --   TROPONINI 0.23* 0.18*  --  0.10* 0.10*  --   --   --   --   < > = values in this interval not displayed.  Estimated Creatinine Clearance: 93.1 mL/min (by C-G formula based on Cr of 1.04).   Medications:  Scheduled:  . amiodarone  200 mg Oral Daily  . aspirin  81 mg Oral Daily  . atorvastatin  80 mg Oral Q0600  . budesonide (PULMICORT) nebulizer solution  0.25 mg Nebulization BID  . guaiFENesin  600 mg Oral BID  . Influenza vac split quadrivalent PF  0.5 mL Intramuscular Tomorrow-1000  . ipratropium-albuterol  3 mL Nebulization QID  . nicotine  14 mg Transdermal Daily  . sodium chloride  3 mL Intravenous Q12H   Infusions:  . heparin 1,600 Units/hr (03/01/15 0142)    Assessment: 54 yo male s/p cath with severe AS and plans for possible AVR. He is on heparin and noted at goal (HL= 0.32). He was on Xarelto PTA and this is on hold.  Goal of Therapy:  Heparin level 0.3-0.7 units/ml Monitor platelets by anticoagulation  protocol: Yes   Plan:  -No heparin changes needed -Daily heparin level and CBC  Harland German, Pharm D 03/01/2015 12:46 PM

## 2015-03-01 NOTE — Progress Notes (Signed)
[facility] PROGRESS NOTE  Assessment/Plan: Left arm numbness and slurred speech due to CVA: - Now resolved MRI was negative, neurology was consulted LDL was greater than 100, N8G 6.2, started statin. - Carotid Dopplers show no ICA stenosis. - Echocardiogram showed an ejection fraction of 50% with no wall motion abnormalities and a grade 1 diastolic heart failure.  Elevated troponins: - No chest pain likely demand ischemia, UDS was negative. - Left heart cath on 02/28/2015 that showed normal coronaries with severe aortic stenosis 0.74 cm based upon a peak transaortic valve gradiant of 57 mmHg and mean pressure of 47 mmHg. - CT surgery consulted. - Can probably DC heparin and resume Xarelto.  Severe AS: - With severe aortic stenosis by left heart cath he relates no shortness of breath on exertion, which he has attributed to COPD.  COPD/tobacco abuse: Seems to be stable.  History of osteoarthritis of the right hip: Will follow-up with or throat as an outpatient.  Chronic atrial fibrillation: CHADSVASCs score of 1  He is on Xarelto for afib and PE  Lab amiodarone in sinus rhythm.   Code Status: Full Code Family Communication: none at bedside Disposition Plan: home pending workup   Consultants:  Cards  Neuro  Procedures:  Ct head  MRI  Antibiotics:  None  HPI/Subjective: He's currently chest pain-free, no further shortness of breath.  Objective: Filed Vitals:   02/28/15 2243 02/28/15 2325 03/01/15 0500 03/01/15 1211  BP: 127/84 144/90 142/90   Pulse:   81   Temp:  98.2 F (36.8 C) 97.8 F (36.6 C)   TempSrc:  Oral Oral   Resp:      Height:      Weight:   94.53 kg (208 lb 6.4 oz)   SpO2: 95% 97% 98% 97%    Intake/Output Summary (Last 24 hours) at 03/01/15 1311 Last data filed at 03/01/15 0832  Gross per 24 hour  Intake   1200 ml  Output    550 ml  Net    650 ml   Filed Weights   02/27/15 1550 02/28/15 0601 03/01/15 0500  Weight:  93.849 kg (206 lb 14.4 oz) 93.441 kg (206 lb) 94.53 kg (208 lb 6.4 oz)    Exam:  General: Alert, awake, oriented x3, in no acute distress.  HEENT: No bruits, no goiter.  Heart: Regular rate and rhythm. Lungs: Good air movement, clear Abdomen: Soft, nontender, nondistended, positive bowel sounds.  Neuro: Grossly intact, nonfocal.   Data Reviewed: Basic Metabolic Panel:  Recent Labs Lab 02/27/15 1044 03/01/15 0420  NA 140 140  K 4.3 4.2  CL 106 107  CO2 29 26  GLUCOSE 106* 98  BUN 17 15  CREATININE 1.27* 1.04  CALCIUM 9.4 8.6*   Liver Function Tests:  Recent Labs Lab 02/27/15 1044  AST 16  ALT 15*  ALKPHOS 84  BILITOT 0.9  PROT 6.7  ALBUMIN 3.7   No results for input(s): LIPASE, AMYLASE in the last 168 hours. No results for input(s): AMMONIA in the last 168 hours. CBC:  Recent Labs Lab 02/27/15 1044 02/28/15 0317 03/01/15 0420  WBC 7.7 6.3 8.6  NEUTROABS 4.7  --   --   HGB 16.4 15.2 13.9  HCT 49.0 45.2 42.2  MCV 88.6 87.4 88.5  PLT 188 168 162   Cardiac Enzymes:  Recent Labs Lab 02/27/15 1044 02/27/15 1634 02/27/15 2221 02/28/15 0317  TROPONINI 0.23* 0.18* 0.10* 0.10*   BNP (last 3 results)  Recent Labs  02/02/15 1758  BNP 112.0*    ProBNP (last 3 results) No results for input(s): PROBNP in the last 8760 hours.  CBG: No results for input(s): GLUCAP in the last 168 hours.  No results found for this or any previous visit (from the past 240 hour(s)).   Studies: Mr Shirlee Latch Wo Contrast  02/27/2015   CLINICAL DATA:  54 year old hypertensive male with atrial fibrillation and history of substance abuse presenting with slurred speech and left arm numbness. Subsequent encounter.  EXAM: MRI HEAD WITHOUT CONTRAST  MRA HEAD WITHOUT CONTRAST  TECHNIQUE: Multiplanar, multiecho pulse sequences of the brain and surrounding structures were obtained without intravenous contrast. Angiographic images of the head were obtained using MRA technique without  contrast.  COMPARISON:  02/27/2015 head CT.  No comparison brain MR.  FINDINGS: MRI HEAD FINDINGS  No acute infarct.  No intracranial hemorrhage.  Tiny area of encephalomalacia posterior medial left parietal-occipital lobe.  No hydrocephalus.  No intracranial mass lesion noted on this unenhanced exam.  Degenerative changes right C1- occiput articulation with fluid extending away from the joint.  Partially empty non expanded sella.  Cervical medullary junction pineal region unremarkable. Orbital structures within normal limits.  MRA HEAD FINDINGS  Anterior circulation without medium or large size vessel significant stenosis or occlusion. Mild narrowing right middle cerebral artery bifurcation. Mild middle cerebral artery branch vessel irregularity bilaterally.  Codominant vertebral arteries without high-grade stenosis.  Mild irregularity basilar artery without high-grade stenosis.  Nonvisualized anterior inferior cerebellar arteries.  Mild superior cerebellar arteries.  Mild posterior cerebral artery proximal and distal branch vessel irregularity and narrowing.  IMPRESSION: MRI HEAD FINDINGS  No acute infarct.  Tiny area of encephalomalacia posterior medial left parietal-occipital lobe.  Degenerative changes right C1- occiput articulation with fluid extending away from the joint.  MRA HEAD FINDINGS  Mild intracranial atherosclerotic type changes without medium or large size vessel significant stenosis occlusion.   Electronically Signed   By: Lacy Duverney M.D.   On: 02/27/2015 21:16   Mr Brain Wo Contrast  02/27/2015   CLINICAL DATA:  54 year old hypertensive male with atrial fibrillation and history of substance abuse presenting with slurred speech and left arm numbness. Subsequent encounter.  EXAM: MRI HEAD WITHOUT CONTRAST  MRA HEAD WITHOUT CONTRAST  TECHNIQUE: Multiplanar, multiecho pulse sequences of the brain and surrounding structures were obtained without intravenous contrast. Angiographic images of the  head were obtained using MRA technique without contrast.  COMPARISON:  02/27/2015 head CT.  No comparison brain MR.  FINDINGS: MRI HEAD FINDINGS  No acute infarct.  No intracranial hemorrhage.  Tiny area of encephalomalacia posterior medial left parietal-occipital lobe.  No hydrocephalus.  No intracranial mass lesion noted on this unenhanced exam.  Degenerative changes right C1- occiput articulation with fluid extending away from the joint.  Partially empty non expanded sella.  Cervical medullary junction pineal region unremarkable. Orbital structures within normal limits.  MRA HEAD FINDINGS  Anterior circulation without medium or large size vessel significant stenosis or occlusion. Mild narrowing right middle cerebral artery bifurcation. Mild middle cerebral artery branch vessel irregularity bilaterally.  Codominant vertebral arteries without high-grade stenosis.  Mild irregularity basilar artery without high-grade stenosis.  Nonvisualized anterior inferior cerebellar arteries.  Mild superior cerebellar arteries.  Mild posterior cerebral artery proximal and distal branch vessel irregularity and narrowing.  IMPRESSION: MRI HEAD FINDINGS  No acute infarct.  Tiny area of encephalomalacia posterior medial left parietal-occipital lobe.  Degenerative changes right C1- occiput articulation with fluid extending away from  the joint.  MRA HEAD FINDINGS  Mild intracranial atherosclerotic type changes without medium or large size vessel significant stenosis occlusion.   Electronically Signed   By: Lacy Duverney M.D.   On: 02/27/2015 21:16    Scheduled Meds: . amiodarone  200 mg Oral Daily  . aspirin  81 mg Oral Daily  . atorvastatin  80 mg Oral Q0600  . budesonide (PULMICORT) nebulizer solution  0.25 mg Nebulization BID  . guaiFENesin  600 mg Oral BID  . Influenza vac split quadrivalent PF  0.5 mL Intramuscular Tomorrow-1000  . ipratropium-albuterol  3 mL Nebulization QID  . nicotine  14 mg Transdermal Daily  .  sodium chloride  3 mL Intravenous Q12H   Continuous Infusions: . heparin 1,600 Units/hr (03/01/15 0142)    Time Spent: 25 min   Marinda Elk  [facility] Pager [phone]. If 7PM-7AM, please contact night-coverage at [email or web address], password [facility] 03/01/2015, 1:11 PM

## 2015-03-01 NOTE — Progress Notes (Signed)
CARDIAC REHAB PHASE I   PRE:  Rate/Rhythm: 93 SR    BP: sitting 123/72    SaO2:   MODE:  Ambulation: 550 ft   POST:  Rate/Rhythm: 100 ST    BP: sitting 122/89     SaO2: 97 RA  Pt with SOB with distance. Sts this is normal for him and that he had severe SOB this am, resolved with breathing treatment that he received one hour later. Pt sts he knows his problem is his valve and he thinks he is ready to discuss fixing it. Discussed smoking cessation with pt. He has thought about quitting but is not ready yet. "I've quit everything else, its the only thing I have left".  [id number]   Elissa Lovett Buckhall CES, ACSM 03/01/2015 10:18 AM

## 2015-03-02 ENCOUNTER — Other Ambulatory Visit (HOSPITAL_COMMUNITY): Payer: Self-pay

## 2015-03-02 DIAGNOSIS — G454 Transient global amnesia: Secondary | ICD-10-CM | POA: Diagnosis not present

## 2015-03-02 DIAGNOSIS — Z7901 Long term (current) use of anticoagulants: Secondary | ICD-10-CM | POA: Diagnosis not present

## 2015-03-02 DIAGNOSIS — I350 Nonrheumatic aortic (valve) stenosis: Secondary | ICD-10-CM | POA: Diagnosis not present

## 2015-03-02 DIAGNOSIS — K029 Dental caries, unspecified: Secondary | ICD-10-CM | POA: Insufficient documentation

## 2015-03-02 DIAGNOSIS — I482 Chronic atrial fibrillation: Secondary | ICD-10-CM | POA: Diagnosis not present

## 2015-03-02 LAB — CBC
HEMATOCRIT: 43.2 % (ref 39.0–52.0)
HEMOGLOBIN: 14.3 g/dL (ref 13.0–17.0)
MCH: 29.4 pg (ref 26.0–34.0)
MCHC: 33.1 g/dL (ref 30.0–36.0)
MCV: 88.9 fL (ref 78.0–100.0)
Platelets: 165 10*3/uL (ref 150–400)
RBC: 4.86 MIL/uL (ref 4.22–5.81)
RDW: 14.5 % (ref 11.5–15.5)
WBC: 7.2 10*3/uL (ref 4.0–10.5)

## 2015-03-02 LAB — BASIC METABOLIC PANEL
Anion gap: 6 (ref 5–15)
BUN: 14 mg/dL (ref 6–20)
CHLORIDE: 105 mmol/L (ref 101–111)
CO2: 27 mmol/L (ref 22–32)
Calcium: 8.8 mg/dL — ABNORMAL LOW (ref 8.9–10.3)
Creatinine, Ser: 1.16 mg/dL (ref 0.61–1.24)
GFR calc non Af Amer: >60 mL/min (ref >60)
Glucose, Bld: 202 mg/dL — ABNORMAL HIGH (ref 65–99)
POTASSIUM: 3.7 mmol/L (ref 3.5–5.1)
SODIUM: 138 mmol/L (ref 135–145)

## 2015-03-02 LAB — HEPARIN LEVEL (UNFRACTIONATED): Heparin Unfractionated: 0.36 IU/mL (ref 0.30–0.70)

## 2015-03-02 MED ORDER — ZOLPIDEM TARTRATE 5 MG PO TABS
5.0000 mg | ORAL_TABLET | Freq: Once | ORAL | Status: AC
  Administered 2015-03-02: 5 mg via ORAL
  Filled 2015-03-02: qty 1

## 2015-03-02 NOTE — Progress Notes (Signed)
ANTICOAGULATION CONSULT NOTE - Follow Up Consult  Pharmacy Consult for heparin Indication: chest pain/ACS  No Known Allergies  Patient Measurements: Height:  (180.3 cm) Weight: 207 lb 6.4 oz (94.076 kg) IBW/kg (Calculated) : 75.3 Heparin Dosing Weight: 93 kg  Vital Signs: Temp: 97.8 F (36.6 C) (09/01 0500) Temp Source: Oral (09/01 0500) BP: 138/88 mmHg (09/01 0500) Pulse Rate: 84 (09/01 0500)  Labs:  Recent Labs  02/27/15 1044 02/27/15 1634  02/27/15 2221 02/28/15 0317  02/28/15 1625 03/01/15 0420 03/01/15 0937 03/02/15 0552  HGB 16.4  --   --   --  15.2  --   --  13.9  --  14.3  HCT 49.0  --   --   --  45.2  --   --  42.2  --  43.2  PLT 188  --   --   --  168  --   --  162  --  165  APTT 33  --   --   --   --   --   --   --   --   --   LABPROT 14.0  --   --   --   --   --   --   --   --   --   INR 1.06  --   --   --   --   --   --   --   --   --   HEPARINUNFRC  --   --   < > <0.10* 0.28*  < > 0.37  --  0.32 0.36  CREATININE 1.27*  --   --   --   --   --   --  1.04  --   --   TROPONINI 0.23* 0.18*  --  0.10* 0.10*  --   --   --   --   --   < > = values in this interval not displayed.  Estimated Creatinine Clearance: 92.9 mL/min (by C-G formula based on Cr of 1.04).   Medications:  Scheduled:  . amiodarone  200 mg Oral Daily  . aspirin  81 mg Oral Daily  . atorvastatin  80 mg Oral Q0600  . budesonide (PULMICORT) nebulizer solution  0.25 mg Nebulization BID  . guaiFENesin  600 mg Oral BID  . Influenza vac split quadrivalent PF  0.5 mL Intramuscular Tomorrow-1000  . ipratropium-albuterol  3 mL Nebulization QID  . nicotine  14 mg Transdermal Daily  . sodium chloride  3 mL Intravenous Q12H   Infusions:  . heparin 1,600 Units/hr (03/01/15 1555)    Assessment: 54 yo male NSTEMI and TIA s/p cath with severe AS and plans for tissue AVR. He is on heparin and noted at goal (HL= 0.36). He was on Xarelto PTA and possible plans for coumadin post procedure.    Goal of Therapy:  Heparin level = 0.3-0.5 Monitor platelets by anticoagulation protocol: Yes   Plan:  -No heparin changes needed -Daily heparin level and CBC  Harland German, Pharm D 03/02/2015 8:50 AM

## 2015-03-02 NOTE — Progress Notes (Signed)
[facility] PROGRESS NOTE  Assessment/Plan: Left arm numbness and slurred speech due to TIA: - Neurology was consulted LDL was greater than 100, Z6X 6.2, started statin.  Elevated troponins/Severe AS:: - Left heart cath on 02/28/2015 that showed normal coronaries with severe aortic stenosis 0.74 cm based upon a peak transaortic valve gradiant of 57 mmHg and mean pressure of 47 mmHg. - CT surgery consulted, recommended valve replacement, dentist evaluation, orthopanogram and teeth extraction  COPD/tobacco abuse: Seems to be stable.  History of osteoarthritis of the right hip: Will follow-up with or throat as an outpatient.  Chronic atrial fibrillation: CHADSVASCs score of 1  He is on heparin for afib and PE  Lab amiodarone in sinus rhythm.   Code Status: Full Code Family Communication: none at bedside Disposition Plan: pending further evaluation   Consultants:  Cards  Neuro  Procedures:  Ct head  MRI  Antibiotics:  None  HPI/Subjective: He's currently chest pain-free, no further shortness of breath.  Objective: Filed Vitals:   03/01/15 2054 03/02/15 0500 03/02/15 0903 03/02/15 1219  BP: 135/89 138/88 135/70   Pulse: 95 84 89   Temp: 98.9 F (37.2 C) 97.8 F (36.6 C) 97.9 F (36.6 C)   TempSrc: Oral Oral Oral   Resp:      Height:      Weight:  94.076 kg (207 lb 6.4 oz)    SpO2: 99% 94% 96% 95%    Intake/Output Summary (Last 24 hours) at 03/02/15 1232 Last data filed at 03/02/15 1200  Gross per 24 hour  Intake    843 ml  Output      0 ml  Net    843 ml   Filed Weights   02/28/15 0601 03/01/15 0500 03/02/15 0500  Weight: 93.441 kg (206 lb) 94.53 kg (208 lb 6.4 oz) 94.076 kg (207 lb 6.4 oz)    Exam:  General: Alert, awake, oriented x3, in no acute distress.  HEENT: No bruits, no goiter.  Heart: Regular rate and rhythm. Lungs: Good air movement, clear, no rales or wheezing Abdomen: Soft, nontender, nondistended, positive bowel  sounds.  Neuro: Grossly intact, nonfocal.   Data Reviewed: Basic Metabolic Panel:  Recent Labs Lab 02/27/15 1044 03/01/15 0420 03/02/15 0938  NA 140 140 138  K 4.3 4.2 3.7  CL 106 107 105  CO2 GLUCOSE 106* 98 202*  BUN CREATININE 1.27* 1.04 1.16  CALCIUM 9.4 8.6* 8.8*   Liver Function Tests:  Recent Labs Lab 02/27/15 1044  AST 16  ALT 15*  ALKPHOS 84  BILITOT 0.9  PROT 6.7  ALBUMIN 3.7   No results for input(s): LIPASE, AMYLASE in the last 168 hours. No results for input(s): AMMONIA in the last 168 hours. CBC:  Recent Labs Lab 02/27/15 1044 02/28/15 0317 03/01/15 0420 03/02/15 0552  WBC 7.7 6.3 8.6 7.2  NEUTROABS 4.7  --   --   --   HGB 16.4 15.2 13.9 14.3  HCT 49.0 45.2 42.2 43.2  MCV 88.6 87.4 88.5 88.9  PLT 188 168 162 165   Cardiac Enzymes:  Recent Labs Lab 02/27/15 1044 02/27/15 1634 02/27/15 2221 02/28/15 0317  TROPONINI 0.23* 0.18* 0.10* 0.10*   BNP (last 3 results)  Recent Labs  02/02/15 1758  BNP 112.0*    ProBNP (last 3 results) No results for input(s): PROBNP in the last 8760 hours.  CBG: No results for input(s): GLUCAP in the last 168 hours.  No  results found for this or any previous visit (from the past 240 hour(s)).   Studies: Dg Orthopantogram  03/01/2015   CLINICAL DATA:  Acute onset of mandibular pain. Assess dental caries. Initial encounter.  EXAM: ORTHOPANTOGRAM/PANORAMIC  COMPARISON:  None.  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 Vague lucencies with regard to multiple maxillary and mandibular teeth, particularly the right central and lateral mandibular incisors, the remaining right maxillary molar, the left second mandibular premolar, and the remaining central maxillary teeth, may reflect dental caries.  The visualized paranasal sinuses are well-aerated. There is no evidence of fracture or dislocation.  IMPRESSION: Vague lucencies with regard to multiple  maxillary and mandibular teeth as described above. These may reflect dental caries, given clinical concern. This would be better assessed on dedicated dental radiographs.   Electronically Signed   By: Roanna Raider M.D.   On: 03/01/2015 22:54    Scheduled Meds: . amiodarone  200 mg Oral Daily  . aspirin  81 mg Oral Daily  . atorvastatin  80 mg Oral Q0600  . budesonide (PULMICORT) nebulizer solution  0.25 mg Nebulization BID  . guaiFENesin  600 mg Oral BID  . Influenza vac split quadrivalent PF  0.5 mL Intramuscular Tomorrow-1000  . ipratropium-albuterol  3 mL Nebulization QID  . nicotine  14 mg Transdermal Daily  . sodium chloride  3 mL Intravenous Q12H   Continuous Infusions: . heparin 1,600 Units/hr (03/01/15 1555)    Time Spent: 25 min   Donald [name]  [facility] Pager [phone]. If 7PM-7AM, please contact night-coverage at [email or web address], password [facility] 03/02/2015, 12:32 PM

## 2015-03-02 NOTE — Progress Notes (Signed)
Patient Name: Donald [name] Date of Encounter: 03/02/2015     Principal Problem:   TIA (transient ischemic attack) Active Problems:   SOB (shortness of breath)   History of pulmonary embolism   Chronic atrial fibrillation   Tobacco abuse   Chronic arthritis   Benign essential HTN   NSTEMI (non-ST elevated myocardial infarction)   Severe aortic stenosis   Polysubstance abuse   Chronic anticoagulation-Xarelto   Non compliance w medication regimen   Stroke with cerebral ischemia    SUBJECTIVE  He is awaiting AVR surgery. Had dental x rays done yesterday. No complaints today. Cant sleep at night.   CURRENT MEDS . amiodarone  200 mg Oral Daily  . aspirin  81 mg Oral Daily  . atorvastatin  80 mg Oral Q0600  . budesonide (PULMICORT) nebulizer solution  0.25 mg Nebulization BID  . guaiFENesin  600 mg Oral BID  . Influenza vac split quadrivalent PF  0.5 mL Intramuscular Tomorrow-1000  . ipratropium-albuterol  3 mL Nebulization QID  . nicotine  14 mg Transdermal Daily  . sodium chloride  3 mL Intravenous Q12H    OBJECTIVE  Filed Vitals:   03/01/15 0500 03/01/15 1211 03/01/15 2054 03/02/15 0500  BP: 142/90  135/89 138/88  Pulse: 81  95 84  Temp: 97.8 F (36.6 C)  98.9 F (37.2 C) 97.8 F (36.6 C)  TempSrc: Oral  Oral Oral  Resp:      Height:      Weight: 208 lb 6.4 oz (94.53 kg)   207 lb 6.4 oz (94.076 kg)  SpO2: 98% 97% 99% 94%    Intake/Output Summary (Last 24 hours) at 03/02/15 0753 Last data filed at 03/02/15 0600  Gross per 24 hour  Intake    720 ml  Output      0 ml  Net    720 ml   Filed Weights   02/28/15 0601 03/01/15 0500 03/02/15 0500  Weight: 206 lb (93.441 kg) 208 lb 6.4 oz (94.53 kg) 207 lb 6.4 oz (94.076 kg)    PHYSICAL EXAM  General: Pleasant, NAD. Neuro: Alert and oriented X 3. Moves all extremities spontaneously. Psych: Normal affect. HEENT:  Normal  Neck: Supple without bruits or JVD. Lungs:  Resp regular and unlabored,  CTA. Heart: RRR no s3, s4, SEM at RUSB. Abdomen: Soft, non-tender, non-distended, BS + x 4.  Extremities: No clubbing, cyanosis or edema. DP/PT/Radials 2+ and equal bilaterally.  Accessory Clinical Findings  CBC  Recent Labs  02/27/15 1044  03/01/15 0420 03/02/15 0552  WBC 7.7  < > 8.6 7.2  NEUTROABS 4.7  --   --   --   HGB 16.4  < > 13.9 14.3  HCT 49.0  < > 42.2 43.2  MCV 88.6  < > 88.5 88.9  PLT 188  < > 162 165  < > = values in this interval not displayed. Basic Metabolic Panel  Recent Labs  02/27/15 1044 03/01/15 0420  NA 140 140  K 4.3 4.2  CL 106 107  CO2 29 26  GLUCOSE 106* 98  BUN 17 15  CREATININE 1.27* 1.04  CALCIUM 9.4 8.6*   Liver Function Tests  Recent Labs  02/27/15 1044  AST 16  ALT 15*  ALKPHOS 84  BILITOT 0.9  PROT 6.7  ALBUMIN 3.7   No results for input(s): LIPASE, AMYLASE in the last 72 hours. Cardiac Enzymes  Recent Labs  02/27/15 1634 02/27/15 2221 02/28/15 0317  TROPONINI 0.18*  0.10* 0.10*   Hemoglobin A1C  Recent Labs  02/28/15 0317  HGBA1C 5.9*   Fasting Lipid Panel  Recent Labs  02/28/15 0317  CHOL 208*  HDL 44  LDLCALC 144*  TRIG 98  CHOLHDL 4.7   Thyroid Function Tests  Recent Labs  02/27/15 1634  TSH 0.477    TELE  NSR   Radiology/Studies  Dg Orthopantogram  03/01/2015   CLINICAL DATA:  Acute onset of mandibular pain. Assess dental caries. Initial encounter.  EXAM: ORTHOPANTOGRAM/PANORAMIC  COMPARISON:  None.  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 Vague lucencies with regard to multiple maxillary and mandibular teeth, particularly the right central and lateral mandibular incisors, the remaining right maxillary molar, the left second mandibular premolar, and the remaining central maxillary teeth, may reflect dental caries.  The visualized paranasal sinuses are well-aerated. There is no evidence of fracture or dislocation.  IMPRESSION: Vague lucencies with  regard to multiple maxillary and mandibular teeth as described above. These may reflect dental caries, given clinical concern. This would be better assessed on dedicated dental radiographs.   Electronically Signed   By: Roanna Raider M.D.   On: 03/01/2015 22:54   Dg Chest 2 View  02/27/2015   CLINICAL DATA:  Left upper extremity region pain. Atrial fibrillation. Hypertension.  EXAM: CHEST  2 VIEW  COMPARISON:  Chest radiograph and chest CT February 02, 2015  FINDINGS: There is a small nipple shadow on the right. There is no edema or consolidation. The heart size and pulmonary vascularity normal. No adenopathy. No pneumothorax. There old rib fractures on the left, stable.  IMPRESSION: Evidence of old rib trauma on the left. No edema or consolidation. No pneumothorax.   Electronically Signed   By: Bretta Bang III M.D.   On: 02/27/2015 12:20   Ct Head Wo Contrast  02/27/2015   CLINICAL DATA:  Slurred speech.  Left arm pain.  EXAM: CT HEAD WITHOUT CONTRAST  TECHNIQUE: Contiguous axial images were obtained from the base of the skull through the vertex without intravenous contrast.  COMPARISON:  02/02/2015, 10/19/2013  FINDINGS: There is no evidence of mass effect, midline shift or extra-axial fluid collections. There is no evidence of a space-occupying lesion or intracranial hemorrhage. There is no evidence of a cortical-based area of acute infarction.  The ventricles and sulci are appropriate for the patient's age. The basal cisterns are patent.  Visualized portions of the orbits are unremarkable. The visualized portions of the paranasal sinuses and mastoid air cells are unremarkable.  The osseous structures are unremarkable.  IMPRESSION: Normal CT of the brain without intravenous contrast.   Electronically Signed   By: Elige Ko   On: 02/27/2015 12:08   Ct Head Wo Contrast  02/03/2015   CLINICAL DATA:  Acute onset of shortness of breath and agitation. Acute encephalopathy. Recently smoked crack. Initial  encounter.  EXAM: CT HEAD WITHOUT CONTRAST  TECHNIQUE: Contiguous axial images were obtained from the base of the skull through the vertex without intravenous contrast.  COMPARISON:  CT of the head performed 10/19/2013  FINDINGS: There is no evidence of acute infarction, mass lesion, or intra- or extra-axial hemorrhage on CT.  The posterior fossa, including the cerebellum, brainstem and fourth ventricle, is within normal limits. The third and lateral ventricles, and basal ganglia are unremarkable in appearance. The cerebral hemispheres are symmetric in appearance, with normal gray-white differentiation. No mass effect or midline shift is seen.  There is no evidence of fracture;  visualized osseous structures are unremarkable in appearance. The orbits are within normal limits. The paranasal sinuses and mastoid air cells are well-aerated. No significant soft tissue abnormalities are seen.  IMPRESSION: Unremarkable noncontrast CT of the head.   Electronically Signed   By: Roanna Raider M.D.   On: 02/03/2015 00:16   Ct Angio Chest Pe W/cm &/or Wo Cm  02/02/2015   CLINICAL DATA:  Dyspnea and agitation.  EXAM: CT ANGIOGRAPHY CHEST WITH CONTRAST  TECHNIQUE: Multidetector CT imaging of the chest was performed using the standard protocol during bolus administration of intravenous contrast. Multiplanar CT image reconstructions and MIPs were obtained to evaluate the vascular anatomy.  CONTRAST:  OMNIPAQUE IOHEXOL 350 MG/ML SOLN  COMPARISON:  02/26/2013, 11/24/2014  FINDINGS: Cardiovascular: There is good opacification of the pulmonary arteries. There is no pulmonary embolism. The thoracic aorta is normal in caliber and intact.  Lungs: There is a 2.2 cm circumscribed ovoid mass at the diaphragmatic surface of the right base. On coronal and sagittal reconstructions it appears to contain fat. This is unchanged from 02/26/2013 and most likely is benign. The lungs are otherwise clear.  Central airways: Patent  Effusions:  None  Lymphadenopathy: None  Esophagus: Unremarkable  Upper abdomen: No significant abnormality no significant abnormality.  Musculoskeletal: None.  Review of the MIP images confirms the above findings.  IMPRESSION: 1. Negative for pulmonary embolus 2. No acute findings are evident in the chest. 3. Benign appearing 2.2 cm circumscribed pleural based mass of the right diaphragmatic surface, unchanged from 02/26/2013. It appears to contain fat. No specific follow-up is necessary.   Electronically Signed   By: Ellery Plunk M.D.   On: 02/02/2015 22:28   Mr Shirlee Latch Wo Contrast  02/27/2015   CLINICAL DATA:  54 year old hypertensive male with atrial fibrillation and history of substance abuse presenting with slurred speech and left arm numbness. Subsequent encounter.  EXAM: MRI HEAD WITHOUT CONTRAST  MRA HEAD WITHOUT CONTRAST  TECHNIQUE: Multiplanar, multiecho pulse sequences of the brain and surrounding structures were obtained without intravenous contrast. Angiographic images of the head were obtained using MRA technique without contrast.  COMPARISON:  02/27/2015 head CT.  No comparison brain MR.  FINDINGS: MRI HEAD FINDINGS  No acute infarct.  No intracranial hemorrhage.  Tiny area of encephalomalacia posterior medial left parietal-occipital lobe.  No hydrocephalus.  No intracranial mass lesion noted on this unenhanced exam.  Degenerative changes right C1- occiput articulation with fluid extending away from the joint.  Partially empty non expanded sella.  Cervical medullary junction pineal region unremarkable. Orbital structures within normal limits.  MRA HEAD FINDINGS  Anterior circulation without medium or large size vessel significant stenosis or occlusion. Mild narrowing right middle cerebral artery bifurcation. Mild middle cerebral artery branch vessel irregularity bilaterally.  Codominant vertebral arteries without high-grade stenosis.  Mild irregularity basilar artery without high-grade stenosis.   Nonvisualized anterior inferior cerebellar arteries.  Mild superior cerebellar arteries.  Mild posterior cerebral artery proximal and distal branch vessel irregularity and narrowing.  IMPRESSION: MRI HEAD FINDINGS  No acute infarct.  Tiny area of encephalomalacia posterior medial left parietal-occipital lobe.  Degenerative changes right C1- occiput articulation with fluid extending away from the joint.  MRA HEAD FINDINGS  Mild intracranial atherosclerotic type changes without medium or large size vessel significant stenosis occlusion.   Electronically Signed   By: Lacy Duverney M.D.   On: 02/27/2015 21:16   Mr Brain Wo Contrast  02/27/2015   CLINICAL DATA:  54 year old hypertensive male  with atrial fibrillation and history of substance abuse presenting with slurred speech and left arm numbness. Subsequent encounter.  EXAM: MRI HEAD WITHOUT CONTRAST  MRA HEAD WITHOUT CONTRAST  TECHNIQUE: Multiplanar, multiecho pulse sequences of the brain and surrounding structures were obtained without intravenous contrast. Angiographic images of the head were obtained using MRA technique without contrast.  COMPARISON:  02/27/2015 head CT.  No comparison brain MR.  FINDINGS: MRI HEAD FINDINGS  No acute infarct.  No intracranial hemorrhage.  Tiny area of encephalomalacia posterior medial left parietal-occipital lobe.  No hydrocephalus.  No intracranial mass lesion noted on this unenhanced exam.  Degenerative changes right C1- occiput articulation with fluid extending away from the joint.  Partially empty non expanded sella.  Cervical medullary junction pineal region unremarkable. Orbital structures within normal limits.  MRA HEAD FINDINGS  Anterior circulation without medium or large size vessel significant stenosis or occlusion. Mild narrowing right middle cerebral artery bifurcation. Mild middle cerebral artery branch vessel irregularity bilaterally.  Codominant vertebral arteries without high-grade stenosis.  Mild irregularity  basilar artery without high-grade stenosis.  Nonvisualized anterior inferior cerebellar arteries.  Mild superior cerebellar arteries.  Mild posterior cerebral artery proximal and distal branch vessel irregularity and narrowing.  IMPRESSION: MRI HEAD FINDINGS  No acute infarct.  Tiny area of encephalomalacia posterior medial left parietal-occipital lobe.  Degenerative changes right C1- occiput articulation with fluid extending away from the joint.  MRA HEAD FINDINGS  Mild intracranial atherosclerotic type changes without medium or large size vessel significant stenosis occlusion.   Electronically Signed   By: Lacy Duverney M.D.   On: 02/27/2015 21:16   Dg Chest Port 1 View  02/02/2015   CLINICAL DATA:  Shortness of breath  EXAM: PORTABLE CHEST - 1 VIEW  COMPARISON:  01/14/2015  FINDINGS: Diffuse interstitial opacity with cephalized blood flow. No interstitial edema or effusion. No collapse or consolidation. Normal heart size and mediastinal contours.  IMPRESSION: Pulmonary venous congestion.   Electronically Signed   By: Marnee Spring M.D.   On: 02/02/2015 18:28   LHC  Conclusion     Normal coronary arteries  Severe aortic stenosis with a calculated aortic valve area of 0.74 cm based upon a peak transaortic valve gradiant of 57 mmHg and mean pressure of 47 mmHg. Cardiac output was 5.7 L/m.  Normal left ventricular systolic function. Left ventricular end-diastolic pressure is also normal.  Normal pulmonary artery pressures. RECOMMENDATIONS:   Per treating team     ASSESSMENT AND PLAN  54 y.o. disabled (hip DJD) male with history of severe aortic stenosis by recent echo, pulmonary embolism, recent polysubstance abuse, COPD, PAF on Xarelto (though he has not had any for weeks) and HTN who presented to the ED for episode of left sided facial droop and slurred speech, now resolved. Cardiology was consulted for elevated troponin.   TIA- sx resolved. Per primary team.   NSTEMI -- pk  troponin 0.29 -- s/p LHC on 02/28/15 which revealed normal coronary arteries and normal LVEF. It did show severe AS.   Severe aortic stenosis- LHC with severe aortic stenosis with a calculated aortic valve area of 0.74 cm based upon a peak transaortic valve gradiant of 57 mmHg and mean pressure of 47 mmHg. Cardiac output was 5.7 L/m. -- He was seen by Dr. Laneta Simmers yesterday who agrees that he requires AVR and is initiating the process with dental evaluation.   Chronic Atrial Fibrillation - CHADSVASCs score of 1 (HTN). He is on Xarelto for afib and PE  by his cardiologist, Dr. Dot Been at [facility]. Encouraged compliance of Xarelto. Held for cath and on heparin. Will continue to hold with possible upcoming surgery -- He has been treated with amiodarone and is maintaining NSR -- His digoxin was discontinued as this might making AS worse.   HTN- not currently being treated and BP stable  Polysubstance abuse- last admission he was positive for benzos and cocaine. UDS negative this admission. He has been sober for 3 weeks. He was living in a half way house but he will not be going back there and plans to be homeless.    History of pulmonary embolism- cont heparin for now. He was on Xarelto prior to admission but was not complaint with this.  Tobacco abuse- smokes 1PPD: counseled on cessation    Signed, Janetta Hora PA-C  Pager [phone]  Patient seen and examined. Agree with assessment and plan. No chest pain or dyspnea. Appreciate Dr. Sharee Pimple evaluation.  Had panorex yesterday suggestive of dental carries; awaiting dental evaluation. For AVR; timing per Dr. Verna Czech, MD, [facility] 03/02/2015 2:49 PM

## 2015-03-02 NOTE — Progress Notes (Signed)
Pt educated and refused to have bed alarm turned on through the night. Will continue to monitor pt closely. 

## 2015-03-02 NOTE — Hospital Discharge Follow-Up (Signed)
Transitional Care Clinic Care Coordination Note:  Admit date:  02/27/15 Discharge date: TBD Discharge Disposition: [facility] in [place], [place]  Patient contact: [phone] (mobile) Emergency contact(s): Stephania Fragmin (friend)-[phone]  This Case Manager reviewed patient's EMR and determined patient would benefit from post-discharge medical management and chronic care management services through the [facility]. Patient has a history of atrial fibrillation, hypertension, COPD, substance abuse (cocaine/alcohol), severe aortic stenosis. This Case Manager met with patient to discuss the services and medical management that can be provided at the [facility]. Patient verbalized understanding and agreed to receive post-discharge care at the [facility].   Patient scheduled for Transitional Care appointment on 03/09/15 at 1100 with Dr. Jarold Song.  Clinic information and appointment time provided to patient. Appointment information also placed on AVS.  Assessment:       Home Environment: Patient is homeless. He was previously staying in [place] and was moving to [place] when his Lucianne Lei broke down in [place]. He plans to stay at [facility] in [place], [place] after discharge.  Once there, he hopes to get accepted into the [facility] program that is onsite.       Support System: Alcoholic Anonymous community       Level of functioning: independent       Home DME: Patient indicates he has home oxygen and a CPAP machine that he "does not use."  He also has a nebulizer that he indicates he uses as needed.  DME supplier is [employer].       Home care services: none       Transportation: Patient indicates he will take the [facility] bus from [facility] to [place]. He indicates he has enough money to take bus to [facility] appointments.        Food/Nutrition: Patient indicates he receives $16 of Food  Stamps/month. He indicates he has access to the food he needs.        Medications: Patient indicates he usually uses [facility] for prescriptions but plans to use [facility] for medication refills.  Community [facility] and [place] resources discussed with patient.        Identified Barriers: homeless-plans to stay at [facility] after he discharges. Patient hopes he will be accepted into [facility] as well.  He spoke with Coordinator of program while this Case Manager was in room. Coordinator indicated he would be able to leave program to go to needed MD appointments if accepted.  Additional barriers include-substance abuse and lack of PCP.        PCP:  None. Patient indicates he is currently looking for a new PCP. Patient may need to establish care at [facility] after 30 days of medical management with the [facility].             Arranged services:        Services communicated to Jacqlyn Krauss, RN CM

## 2015-03-02 NOTE — Care Management Note (Addendum)
Case Management Note  Patient Details  Name: Donald [name] MRN: [id number] Date of Birth: May 30, 1961  Subjective/Objective:   Pt admitted for TIA symptoms. Increased troponin on admission. Post cath- continues on IC heparin gtt. Pt with a Hx: NSTEMI (non-ST elevated myocardial infarction), Severe aortic stenosis, Polysubstance abuse and Non compliance w medication regimen. Dental Consult in process- Awaiting AVR surgery. Pt has insurance listed as [employer]. Pt was previously at a Shelter in [place]. He was in transit to [place] to live at [place] and his Zenaida Niece broke down. Pt is without PCP.            Action/Plan: CM was able to speak with the Transitional Care Liaison [place] and she spoke with pt in regards to hospital f/u at the [facility] and plans post d/c . Pt was agreeable to appointment being set up. Appointment placed on AVS. While Shanda Bumps was in room with the pt; he did call the [facility] in [place] and he should be able to go once d/c. He will use [facility]- CM did look at schedule and [facility] has a limited schedule for Sat and closed on Monday due to Holiday. Pt uses [facility] near [place]. [facility] has a pharmacy on site and pt is aware. Pharmacy hours M-F 8-5pm. Pt may need paper Rx's once ready for d/c. No further needs from CM at this time.    Expected Discharge Date:                  Expected Discharge Plan:  Homeless Shelter ([facility])  In-House Referral:  Clinical Social Work  Discharge planning Services  CM Consult, Follow-up appt scheduled, [facility]  Post Acute Care Choice:  NA Choice offered to:  NA  DME Arranged:  N/A DME Agency:  NA  HH Arranged:  NA HH Agency:  NA  Status of Service:  Completed, signed off  Medicare Important Message Given:    Date Medicare IM Given:    Medicare IM give by:    Date Additional Medicare IM Given:    Additional Medicare Important Message give by:     If discussed  at Long Length of Stay Meetings, dates discussed:    Additional Comments: 1223 03-03-15 Tomi Bamberger, RN,BSN [phone] [facility] Liaison did reschedule [facility] f/u appointment due to conflict with teeth extraction. Appointment was placed on AVS form. CSW to assist with transportation. Pt just stated he will be staying with a friend in [place]. CM did call CSW to see if aware and she was not aware. CSW to f/u. No further needs from CM at this time.   Gala Lewandowsky, RN 03/02/2015, 10:47 AM

## 2015-03-02 NOTE — Progress Notes (Signed)
Pt c/o sob, feeling like he is "not moving any air". Auscultation reveals diminished breath sounds in all fields. RT paged, scheduled nebs from 8am moved to 0530. Will continue to monitor closely.

## 2015-03-03 ENCOUNTER — Observation Stay (HOSPITAL_COMMUNITY): Payer: Medicare HMO

## 2015-03-03 ENCOUNTER — Encounter (HOSPITAL_COMMUNITY): Payer: Self-pay | Admitting: Dentistry

## 2015-03-03 ENCOUNTER — Observation Stay (HOSPITAL_COMMUNITY): Payer: Self-pay

## 2015-03-03 DIAGNOSIS — Z7901 Long term (current) use of anticoagulants: Secondary | ICD-10-CM | POA: Diagnosis not present

## 2015-03-03 DIAGNOSIS — I214 Non-ST elevation (NSTEMI) myocardial infarction: Secondary | ICD-10-CM | POA: Diagnosis not present

## 2015-03-03 DIAGNOSIS — Z01818 Encounter for other preprocedural examination: Secondary | ICD-10-CM | POA: Diagnosis not present

## 2015-03-03 DIAGNOSIS — K029 Dental caries, unspecified: Secondary | ICD-10-CM | POA: Diagnosis not present

## 2015-03-03 DIAGNOSIS — I482 Chronic atrial fibrillation: Secondary | ICD-10-CM | POA: Diagnosis not present

## 2015-03-03 DIAGNOSIS — I350 Nonrheumatic aortic (valve) stenosis: Secondary | ICD-10-CM | POA: Diagnosis not present

## 2015-03-03 DIAGNOSIS — G458 Other transient cerebral ischemic attacks and related syndromes: Secondary | ICD-10-CM | POA: Diagnosis not present

## 2015-03-03 DIAGNOSIS — G459 Transient cerebral ischemic attack, unspecified: Secondary | ICD-10-CM | POA: Diagnosis not present

## 2015-03-03 DIAGNOSIS — I10 Essential (primary) hypertension: Secondary | ICD-10-CM | POA: Diagnosis not present

## 2015-03-03 DIAGNOSIS — Z86711 Personal history of pulmonary embolism: Secondary | ICD-10-CM | POA: Diagnosis not present

## 2015-03-03 LAB — PULMONARY FUNCTION TEST
DL/VA % pred: 67 %
DL/VA: 3.19 ml/min/mmHg/L
DLCO COR % PRED: 61 %
DLCO cor: 20.62 ml/min/mmHg
DLCO unc % pred: 60 %
DLCO unc: 20.5 ml/min/mmHg
FEF 25-75 POST: 1.23 L/s
FEF 25-75 Pre: 0.69 L/sec
FEF2575-%Change-Post: 78 %
FEF2575-%PRED-POST: 37 %
FEF2575-%Pred-Pre: 21 %
FEV1-%CHANGE-POST: 14 %
FEV1-%Pred-Post: 55 %
FEV1-%Pred-Pre: 48 %
FEV1-Post: 2.15 L
FEV1-Pre: 1.88 L
FEV1FVC-%Change-Post: -11 %
FEV1FVC-%PRED-PRE: 67 %
FEV6-%Change-Post: 17 %
FEV6-%Pred-Post: 82 %
FEV6-%Pred-Pre: 69 %
FEV6-POST: 3.97 L
FEV6-Pre: 3.39 L
FEV6FVC-%Change-Post: -9 %
FEV6FVC-%PRED-POST: 87 %
FEV6FVC-%Pred-Pre: 96 %
FVC-%CHANGE-POST: 29 %
FVC-%PRED-POST: 93 %
FVC-%PRED-PRE: 72 %
FVC-POST: 4.7 L
FVC-PRE: 3.64 L
PRE FEV6/FVC RATIO: 93 %
Post FEV1/FVC ratio: 46 %
Post FEV6/FVC ratio: 84 %
Pre FEV1/FVC ratio: 52 %
RV % pred: 121 %
RV: 2.69 L
TLC % pred: 101 %
TLC: 7.27 L

## 2015-03-03 LAB — HEPARIN LEVEL (UNFRACTIONATED): Heparin Unfractionated: 0.23 IU/mL — ABNORMAL LOW (ref 0.30–0.70)

## 2015-03-03 LAB — CBC
HEMATOCRIT: 43.5 % (ref 39.0–52.0)
HEMOGLOBIN: 14.4 g/dL (ref 13.0–17.0)
MCH: 29.4 pg (ref 26.0–34.0)
MCHC: 33.1 g/dL (ref 30.0–36.0)
MCV: 88.8 fL (ref 78.0–100.0)
Platelets: 179 10*3/uL (ref 150–400)
RBC: 4.9 MIL/uL (ref 4.22–5.81)
RDW: 14.4 % (ref 11.5–15.5)
WBC: 6.5 10*3/uL (ref 4.0–10.5)

## 2015-03-03 MED ORDER — RIVAROXABAN 20 MG PO TABS: 20.0000 mg | ORAL_TABLET | Freq: Every day | ORAL | Status: DC

## 2015-03-03 MED ORDER — AMOXICILLIN-POT CLAVULANATE 875-125 MG PO TABS: 1.0000 | ORAL_TABLET | Freq: Two times a day (BID) | ORAL | Status: DC

## 2015-03-03 MED ORDER — ALBUTEROL SULFATE (2.5 MG/3ML) 0.083% IN NEBU
2.5000 mg | INHALATION_SOLUTION | Freq: Once | RESPIRATORY_TRACT | Status: AC
  Administered 2015-03-03: 2.5 mg via RESPIRATORY_TRACT

## 2015-03-03 MED ORDER — ZOLPIDEM TARTRATE 5 MG PO TABS
5.0000 mg | ORAL_TABLET | Freq: Once | ORAL | Status: AC
  Administered 2015-03-03: 5 mg via ORAL
  Filled 2015-03-03: qty 1

## 2015-03-03 MED ORDER — NICOTINE 14 MG/24HR TD PT24: 14.0000 mg | MEDICATED_PATCH | Freq: Every day | TRANSDERMAL | Status: DC

## 2015-03-03 MED ORDER — AMIODARONE HCL 200 MG PO TABS: 200.0000 mg | ORAL_TABLET | Freq: Every day | ORAL | Status: AC

## 2015-03-03 MED ORDER — AMOXICILLIN-POT CLAVULANATE 875-125 MG PO TABS
1.0000 | ORAL_TABLET | Freq: Two times a day (BID) | ORAL | Status: DC
  Administered 2015-03-03: 1 via ORAL
  Filled 2015-03-03: qty 1

## 2015-03-03 MED ORDER — ATORVASTATIN CALCIUM 40 MG PO TABS: 40.0000 mg | ORAL_TABLET | Freq: Every day | ORAL | Status: AC

## 2015-03-03 MED ORDER — XARELTO 20 MG PO TABS: 20.0000 mg | ORAL_TABLET | Freq: Every day | ORAL | Status: AC

## 2015-03-03 NOTE — Progress Notes (Signed)
CSW (Clinical Child psychotherapist) visited pt room to confirm [employer] plan as CSW was notified pt plan has changed. Pt informed CSW he will return to [facility] because he need to be in [place] for follow-up next week. Pt did ask for CSW to see if he is able to dc to [facility] as this would be easiest for follow up. CSW called and shelter states they are currently full. Pt still agreeable to taking [facility] bus to [place]. CSW signing off.  Batool Majid, LCSWA [phone]

## 2015-03-03 NOTE — Hospital Discharge Follow-Up (Signed)
Transitional Care Clinic:  Patient to follow-up with Transitional Care Clinic at [place] after discharge. Patient's appointment initially scheduled for 03/09/15; however, patient scheduled to have dental extraction on 03/09/15.  Met with patient to discuss, and Transitional Care Clinic appointment changed to 03/13/15 at 1115 with Dr. Jarold Song. Patient's AVS updated. He also indicated his plan is to now stay with a friend in [place] until his dental extraction has been done.  He then plans to go to [facility] shelter and try to get into [facility] program. Patient's contact number is [phone]. Jacqlyn Krauss,  RN CM updated.

## 2015-03-03 NOTE — Discharge Summary (Signed)
Physician Discharge Summary  Donald [name] ZOX:[id number] DOB: 12/22/1960 DOA: 02/27/2015  PCP: Pcp Not In System  Admit date: 02/27/2015 Discharge date: 03/03/2015  Time spent:35 minutes  Recommendations for Outpatient Follow-up:  1. Follow-up with Dr. Thomasena Edis on 03/09/2015 for tooth extraction. 2. Follow-up with Dr. Laneta Simmers on 12 2016 to schedule appointment for possible aortic valve replacement. 3. Continue Augmentin for 7 days.  Discharge Diagnoses:  Principal Problem:   TIA (transient ischemic attack) Active Problems:   SOB (shortness of breath)   History of pulmonary embolism   Chronic atrial fibrillation   Tobacco abuse   Chronic arthritis   Benign essential HTN   NSTEMI (non-ST elevated myocardial infarction)   Severe aortic stenosis   Polysubstance abuse   Chronic anticoagulation-Xarelto   Non compliance w medication regimen   Stroke with cerebral ischemia   Caries   Discharge Condition: stable  Diet recommendation: regular  Filed Weights   03/01/15 0500 03/02/15 0500 03/03/15 0400  Weight: 94.53 kg (208 lb 6.4 oz) 94.076 kg (207 lb 6.4 oz) 95.981 kg (211 lb 9.6 oz)    History of present illness:  54 year old male with past medical history of chronic A. fib on Xarelto noncompliant, hypertension who comes into the ED with slurred speech and left arm numbness.  Hospital Course:  Left arm numbness and Slurred speech due to TIA: His weakness resolved, MRI was done that showed no acute CVA, he was started on a statin as his LDL is greater than 100 A1c was 6.2. Carotid Doppler showed no aortic stenosis. Echo was done that showed an ejection fraction of 50%.  Severe aortic stenosis with elevated troponins: Elevated biomarker most likely in the setting of demand ischemia is UDS was negative. Cardiology was consulted who recommended a left heart cath that was on 02/28/2015 show severe aortic stenosis with a peak cancerous sec valve gradient of 0.74 7 m and  nonobstructive coronary artery sees, CT surgery was consulted they recommended, dental evaluation who recommended to assess extraction stable findings on the orthopantogram. He is scheduled to get distraction on 03/09/2015 he will stop his Xarelto on 08/06/2014. Dr.Bartle follow-up CT surgery is to follow-up on him on 03/13/2015 to schedule valve replacement.  COPD: No changes were made.  History of ulcer arthritis of the right hip will follow-up with orthopedic as an outpatient.  Chronic atrial fibrillation: CHADSVASCs score of 1, he remained rate controlled. Due to his elevated troponin she was started on IV heparin also cath was completed he was changed as well to. Cardiology was consulted and recommended to continue amiodarone. He remained in sinus rhythm.  Consultants:  Cards  Neuro  Procedures:  Ct head  MRI  Cardiac cath  Antibiotics:  None  Discharge Exam: Filed Vitals:   03/03/15 0400  BP: 144/97  Pulse:   Temp: 98.2 F (36.8 C)  Resp: 18    General: A&O x3 Cardiovascular: RRR Respiratory: good air movement CTA B/l  Discharge Instructions   Discharge Instructions    Ambulatory referral to Neurology    Complete by:  As directed   Pt will follow up with Dr. Roda Shutters at [facility] in about 2 months. Thanks.     Diet - low sodium heart healthy    Complete by:  As directed      Increase activity slowly    Complete by:  As directed           Current Discharge Medication List    START taking these medications  Details  amoxicillin-clavulanate (AUGMENTIN) 875-125 MG per tablet Take 1 tablet by mouth every 12 (twelve) hours. Qty: 15 tablet, Refills: 0    nicotine (NICODERM CQ - DOSED IN MG/24 HOURS) 14 mg/24hr patch Place 1 patch (14 mg total) onto the skin daily. Qty: 28 patch, Refills: 0      CONTINUE these medications which have NOT CHANGED   Details  albuterol (PROVENTIL) (2.5 MG/3ML) 0.083% nebulizer solution Take 2.5 mg by nebulization every 6 (six)  hours as needed for wheezing or shortness of breath.     amiodarone (PACERONE) 200 MG tablet Take 1 tablet (200 mg total) by mouth daily. For Atrial fibrillation/Hypertrophic Cardiomyopathy    budesonide-formoterol (SYMBICORT) 160-4.5 MCG/ACT inhaler Inhale 2 puffs into the lungs 2 (two) times daily.    COMBIVENT RESPIMAT 20-100 MCG/ACT AERS respimat Take 1 puff by mouth every 6 (six) hours as needed for wheezing or shortness of breath.     XARELTO 20 MG TABS tablet Take 20 mg by mouth daily with supper.       STOP taking these medications     digoxin (LANOXIN) 0.25 MG tablet        No Known Allergies Follow-up Information    Follow up with Xu,Jindong, MD. Schedule an appointment as soon as possible for a visit in 2 months.   Specialty:  Neurology   Why:  stroke clinic   Contact information:   [address] [place] [address] [phone]       Follow up with [facility]     On 03/13/2015.   Why:  [facility] appointment on 03/13/15 at 11:15 with Dr. Venetia Night.   Contact information:   [address] [place] [address] [phone]      Follow up with Charlynne Pander, DDS In 1 week.   Specialty:  Dentistry   Contact information:   [address] [place] [address] [phone]       Follow up with Alleen Borne, MD In 2 weeks.   Specialty:  Cardiothoracic Surgery   Why:  follow upw ith Dr. Renato Battles call for appointment   Contact information:   [address] [place] [address] [phone]        The results of significant diagnostics from this hospitalization (including imaging, microbiology, ancillary and laboratory) are listed below for reference.    Significant Diagnostic Studies: Dg Orthopantogram  03/01/2015   CLINICAL DATA:  Acute onset of mandibular pain. Assess dental caries. Initial encounter.  EXAM: ORTHOPANTOGRAM/PANORAMIC  COMPARISON:  None.   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 Vague lucencies with regard to multiple maxillary and mandibular teeth, particularly the right central and lateral mandibular incisors, the remaining right maxillary molar, the left second mandibular premolar, and the remaining central maxillary teeth, may reflect dental caries.  The visualized paranasal sinuses are well-aerated. There is no evidence of fracture or dislocation.  IMPRESSION: Vague lucencies with regard to multiple maxillary and mandibular teeth as described above. These may reflect dental caries, given clinical concern. This would be better assessed on dedicated dental radiographs.   Electronically Signed   By: Roanna Raider M.D.   On: 03/01/2015 22:54   Dg Chest 2 View  02/27/2015   CLINICAL DATA:  Left upper extremity region pain. Atrial fibrillation. Hypertension.  EXAM: CHEST  2 VIEW  COMPARISON:  Chest radiograph and chest CT February 02, 2015  FINDINGS: There  is a small nipple shadow on the right. There is no edema or consolidation. The heart size and pulmonary vascularity normal. No adenopathy. No pneumothorax. There old rib fractures on the left, stable.  IMPRESSION: Evidence of old rib trauma on the left. No edema or consolidation. No pneumothorax.   Electronically Signed   By: Bretta Bang III M.D.   On: 02/27/2015 12:20   Ct Head Wo Contrast  02/27/2015   CLINICAL DATA:  Slurred speech.  Left arm pain.  EXAM: CT HEAD WITHOUT CONTRAST  TECHNIQUE: Contiguous axial images were obtained from the base of the skull through the vertex without intravenous contrast.  COMPARISON:  02/02/2015, 10/19/2013  FINDINGS: There is no evidence of mass effect, midline shift or extra-axial fluid collections. There is no evidence of a space-occupying lesion or intracranial hemorrhage. There is no evidence of a cortical-based area of acute infarction.  The ventricles and sulci are appropriate for the patient's age. The basal  cisterns are patent.  Visualized portions of the orbits are unremarkable. The visualized portions of the paranasal sinuses and mastoid air cells are unremarkable.  The osseous structures are unremarkable.  IMPRESSION: Normal CT of the brain without intravenous contrast.   Electronically Signed   By: Elige Ko   On: 02/27/2015 12:08   Ct Head Wo Contrast  02/03/2015   CLINICAL DATA:  Acute onset of shortness of breath and agitation. Acute encephalopathy. Recently smoked crack. Initial encounter.  EXAM: CT HEAD WITHOUT CONTRAST  TECHNIQUE: Contiguous axial images were obtained from the base of the skull through the vertex without intravenous contrast.  COMPARISON:  CT of the head performed 10/19/2013  FINDINGS: There is no evidence of acute infarction, mass lesion, or intra- or extra-axial hemorrhage on CT.  The posterior fossa, including the cerebellum, brainstem and fourth ventricle, is within normal limits. The third and lateral ventricles, and basal ganglia are unremarkable in appearance. The cerebral hemispheres are symmetric in appearance, with normal gray-white differentiation. No mass effect or midline shift is seen.  There is no evidence of fracture; visualized osseous structures are unremarkable in appearance. The orbits are within normal limits. The paranasal sinuses and mastoid air cells are well-aerated. No significant soft tissue abnormalities are seen.  IMPRESSION: Unremarkable noncontrast CT of the head.   Electronically Signed   By: Roanna Raider M.D.   On: 02/03/2015 00:16   Ct Angio Chest Pe W/cm &/or Wo Cm  02/02/2015   CLINICAL DATA:  Dyspnea and agitation.  EXAM: CT ANGIOGRAPHY CHEST WITH CONTRAST  TECHNIQUE: Multidetector CT imaging of the chest was performed using the standard protocol during bolus administration of intravenous contrast. Multiplanar CT image reconstructions and MIPs were obtained to evaluate the vascular anatomy.  CONTRAST:  OMNIPAQUE IOHEXOL 350 MG/ML SOLN   COMPARISON:  02/26/2013, 11/24/2014  FINDINGS: Cardiovascular: There is good opacification of the pulmonary arteries. There is no pulmonary embolism. The thoracic aorta is normal in caliber and intact.  Lungs: There is a 2.2 cm circumscribed ovoid mass at the diaphragmatic surface of the right base. On coronal and sagittal reconstructions it appears to contain fat. This is unchanged from 02/26/2013 and most likely is benign. The lungs are otherwise clear.  Central airways: Patent  Effusions: None  Lymphadenopathy: None  Esophagus: Unremarkable  Upper abdomen: No significant abnormality no significant abnormality.  Musculoskeletal: None.  Review of the MIP images confirms the above findings.  IMPRESSION: 1. Negative for pulmonary embolus 2. No acute findings are evident in the  chest. 3. Benign appearing 2.2 cm circumscribed pleural based mass of the right diaphragmatic surface, unchanged from 02/26/2013. It appears to contain fat. No specific follow-up is necessary.   Electronically Signed   By: Ellery Plunk M.D.   On: 02/02/2015 22:28   Mr Shirlee Latch Wo Contrast  02/27/2015   CLINICAL DATA:  54 year old hypertensive male with atrial fibrillation and history of substance abuse presenting with slurred speech and left arm numbness. Subsequent encounter.  EXAM: MRI HEAD WITHOUT CONTRAST  MRA HEAD WITHOUT CONTRAST  TECHNIQUE: Multiplanar, multiecho pulse sequences of the brain and surrounding structures were obtained without intravenous contrast. Angiographic images of the head were obtained using MRA technique without contrast.  COMPARISON:  02/27/2015 head CT.  No comparison brain MR.  FINDINGS: MRI HEAD FINDINGS  No acute infarct.  No intracranial hemorrhage.  Tiny area of encephalomalacia posterior medial left parietal-occipital lobe.  No hydrocephalus.  No intracranial mass lesion noted on this unenhanced exam.  Degenerative changes right C1- occiput articulation with fluid extending away from the joint.   Partially empty non expanded sella.  Cervical medullary junction pineal region unremarkable. Orbital structures within normal limits.  MRA HEAD FINDINGS  Anterior circulation without medium or large size vessel significant stenosis or occlusion. Mild narrowing right middle cerebral artery bifurcation. Mild middle cerebral artery branch vessel irregularity bilaterally.  Codominant vertebral arteries without high-grade stenosis.  Mild irregularity basilar artery without high-grade stenosis.  Nonvisualized anterior inferior cerebellar arteries.  Mild superior cerebellar arteries.  Mild posterior cerebral artery proximal and distal branch vessel irregularity and narrowing.  IMPRESSION: MRI HEAD FINDINGS  No acute infarct.  Tiny area of encephalomalacia posterior medial left parietal-occipital lobe.  Degenerative changes right C1- occiput articulation with fluid extending away from the joint.  MRA HEAD FINDINGS  Mild intracranial atherosclerotic type changes without medium or large size vessel significant stenosis occlusion.   Electronically Signed   By: Lacy Duverney M.D.   On: 02/27/2015 21:16   Mr Brain Wo Contrast  02/27/2015   CLINICAL DATA:  54 year old hypertensive male with atrial fibrillation and history of substance abuse presenting with slurred speech and left arm numbness. Subsequent encounter.  EXAM: MRI HEAD WITHOUT CONTRAST  MRA HEAD WITHOUT CONTRAST  TECHNIQUE: Multiplanar, multiecho pulse sequences of the brain and surrounding structures were obtained without intravenous contrast. Angiographic images of the head were obtained using MRA technique without contrast.  COMPARISON:  02/27/2015 head CT.  No comparison brain MR.  FINDINGS: MRI HEAD FINDINGS  No acute infarct.  No intracranial hemorrhage.  Tiny area of encephalomalacia posterior medial left parietal-occipital lobe.  No hydrocephalus.  No intracranial mass lesion noted on this unenhanced exam.  Degenerative changes right C1- occiput  articulation with fluid extending away from the joint.  Partially empty non expanded sella.  Cervical medullary junction pineal region unremarkable. Orbital structures within normal limits.  MRA HEAD FINDINGS  Anterior circulation without medium or large size vessel significant stenosis or occlusion. Mild narrowing right middle cerebral artery bifurcation. Mild middle cerebral artery branch vessel irregularity bilaterally.  Codominant vertebral arteries without high-grade stenosis.  Mild irregularity basilar artery without high-grade stenosis.  Nonvisualized anterior inferior cerebellar arteries.  Mild superior cerebellar arteries.  Mild posterior cerebral artery proximal and distal branch vessel irregularity and narrowing.  IMPRESSION: MRI HEAD FINDINGS  No acute infarct.  Tiny area of encephalomalacia posterior medial left parietal-occipital lobe.  Degenerative changes right C1- occiput articulation with fluid extending away from the joint.  MRA HEAD FINDINGS  Mild intracranial atherosclerotic type changes without medium or large size vessel significant stenosis occlusion.   Electronically Signed   By: Lacy Duverney M.D.   On: 02/27/2015 21:16   Dg Chest Port 1 View  02/02/2015   CLINICAL DATA:  Shortness of breath  EXAM: PORTABLE CHEST - 1 VIEW  COMPARISON:  01/14/2015  FINDINGS: Diffuse interstitial opacity with cephalized blood flow. No interstitial edema or effusion. No collapse or consolidation. Normal heart size and mediastinal contours.  IMPRESSION: Pulmonary venous congestion.   Electronically Signed   By: Marnee Spring M.D.   On: 02/02/2015 18:28    Microbiology: No results found for this or any previous visit (from the past 240 hour(s)).   Labs: Basic Metabolic Panel:  Recent Labs Lab 02/27/15 1044 03/01/15 0420 03/02/15 0938  NA 140 140 138  K 4.3 4.2 3.7  CL 106 107 105  CO2 29 26 27   GLUCOSE 106* 98 202*  BUN 17 15 14   CREATININE 1.27* 1.04 1.16  CALCIUM 9.4 8.6* 8.8*    Liver Function Tests:  Recent Labs Lab 02/27/15 1044  AST 16  ALT 15*  ALKPHOS 84  BILITOT 0.9  PROT 6.7  ALBUMIN 3.7   No results for input(s): LIPASE, AMYLASE in the last 168 hours. No results for input(s): AMMONIA in the last 168 hours. CBC:  Recent Labs Lab 02/27/15 1044 02/28/15 0317 03/01/15 0420 03/02/15 0552 03/03/15 0504  WBC 7.7 6.3 8.6 7.2 6.5  NEUTROABS 4.7  --   --   --   --   HGB 16.4 15.2 13.9 14.3 14.4  HCT 49.0 45.2 42.2 43.2 43.5  MCV 88.6 87.4 88.5 88.9 88.8  PLT 188 168 162 165 179   Cardiac Enzymes:  Recent Labs Lab 02/27/15 1044 02/27/15 1634 02/27/15 2221 02/28/15 0317  TROPONINI 0.23* 0.18* 0.10* 0.10*   BNP: BNP (last 3 results)  Recent Labs  02/02/15 1758  BNP 112.0*    ProBNP (last 3 results) No results for input(s): PROBNP in the last 8760 hours.  CBG: No results for input(s): GLUCAP in the last 168 hours.    Signed:  Marinda Elk  [facility] 03/03/2015, 12:52 PM

## 2015-03-03 NOTE — Progress Notes (Signed)
Patient Name: Donald [name] Date of Encounter: 03/03/2015     Principal Problem:   TIA (transient ischemic attack) Active Problems:   SOB (shortness of breath)   History of pulmonary embolism   Chronic atrial fibrillation   Tobacco abuse   Chronic arthritis   Benign essential HTN   NSTEMI (non-ST elevated myocardial infarction)   Severe aortic stenosis   Polysubstance abuse   Chronic anticoagulation-Xarelto   Non compliance w medication regimen   Stroke with cerebral ischemia   [place]    SUBJECTIVE  He is awaiting AVR surgery. Had dental x rays done . No complaints today. Awaiting dental consult.   CURRENT MEDS . amiodarone  200 mg Oral Daily  . aspirin  81 mg Oral Daily  . atorvastatin  80 mg Oral Q0600  . budesonide (PULMICORT) nebulizer solution  0.25 mg Nebulization BID  . guaiFENesin  600 mg Oral BID  . Influenza vac split quadrivalent PF  0.5 mL Intramuscular Tomorrow-1000  . ipratropium-albuterol  3 mL Nebulization QID  . nicotine  14 mg Transdermal Daily  . sodium chloride  3 mL Intravenous Q12H    OBJECTIVE  Filed Vitals:   03/02/15 2027 03/03/15 0001 03/03/15 0400 03/03/15 0643  BP: 127/84 130/94 144/97   Pulse: 84 79    Temp: 99.6 F (37.6 C) 98.7 F (37.1 C) 98.2 F (36.8 C)   TempSrc: Oral Oral Oral   Resp: Height:      Weight:   211 lb 9.6 oz (95.981 kg)   SpO2: 96% 97% 96% 94%    Intake/Output Summary (Last 24 hours) at 03/03/15 0835 Last data filed at 03/02/15 2027  Gross per 24 hour  Intake    960 ml  Output      0 ml  Net    960 ml   Filed Weights   03/01/15 0500 03/02/15 0500 03/03/15 0400  Weight: 208 lb 6.4 oz (94.53 kg) 207 lb 6.4 oz (94.076 kg) 211 lb 9.6 oz (95.981 kg)    PHYSICAL EXAM  General: Pleasant, NAD. Neuro: Alert and oriented X 3. Moves all extremities spontaneously. Psych: Normal affect. HEENT:  Normal  Neck: Supple without bruits or JVD. Lungs:  Resp regular and unlabored, CTA. Heart: RRR  no s3, s4, SEM at RUSB. Abdomen: Soft, non-tender, non-distended, BS + x 4.  Extremities: No clubbing, cyanosis or edema. DP/PT/Radials 2+ and equal bilaterally.  Accessory Clinical Findings  CBC  Recent Labs  03/02/15 0552 03/03/15 0504  WBC 7.2 6.5  HGB 14.3 14.4  HCT 43.2 43.5  MCV 88.9 88.8  PLT 165 179   Basic Metabolic Panel  Recent Labs  03/01/15 0420 03/02/15 0938  NA 140 138  K 4.2 3.7  CL 107 105  CO2 26 27  GLUCOSE 98 202*  BUN 15 14  CREATININE 1.04 1.16  CALCIUM 8.6* 8.8*    TELE  NSR with some sinus tachy  Radiology/Studies  Dg Orthopantogram  03/01/2015   CLINICAL DATA:  Acute onset of mandibular pain. Assess dental caries. Initial encounter.  EXAM: ORTHOPANTOGRAM/PANORAMIC  COMPARISON:  None.  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 Vague lucencies with regard to multiple maxillary and mandibular teeth, particularly the right central and lateral mandibular incisors, the remaining right maxillary molar, the left second mandibular premolar, and the remaining central maxillary teeth, may reflect dental caries.  The visualized paranasal sinuses are well-aerated. There is no evidence of  fracture or dislocation.  IMPRESSION: Vague lucencies with regard to multiple maxillary and mandibular teeth as described above. These may reflect dental caries, given clinical concern. This would be better assessed on dedicated dental radiographs.   Electronically Signed   By: Roanna Raider M.D.   On: 03/01/2015 22:54   Dg Chest 2 View  02/27/2015   CLINICAL DATA:  Left upper extremity region pain. Atrial fibrillation. Hypertension.  EXAM: CHEST  2 VIEW  COMPARISON:  Chest radiograph and chest CT February 02, 2015  FINDINGS: There is a small nipple shadow on the right. There is no edema or consolidation. The heart size and pulmonary vascularity normal. No adenopathy. No pneumothorax. There old rib fractures on the left, stable.  IMPRESSION:  Evidence of old rib trauma on the left. No edema or consolidation. No pneumothorax.   Electronically Signed   By: Bretta Bang III M.D.   On: 02/27/2015 12:20   Ct Head Wo Contrast  02/27/2015   CLINICAL DATA:  Slurred speech.  Left arm pain.  EXAM: CT HEAD WITHOUT CONTRAST  TECHNIQUE: Contiguous axial images were obtained from the base of the skull through the vertex without intravenous contrast.  COMPARISON:  02/02/2015, 10/19/2013  FINDINGS: There is no evidence of mass effect, midline shift or extra-axial fluid collections. There is no evidence of a space-occupying lesion or intracranial hemorrhage. There is no evidence of a cortical-based area of acute infarction.  The ventricles and sulci are appropriate for the patient's age. The basal cisterns are patent.  Visualized portions of the orbits are unremarkable. The visualized portions of the paranasal sinuses and mastoid air cells are unremarkable.  The osseous structures are unremarkable.  IMPRESSION: Normal CT of the brain without intravenous contrast.   Electronically Signed   By: Elige Ko   On: 02/27/2015 12:08   Ct Head Wo Contrast  02/03/2015   CLINICAL DATA:  Acute onset of shortness of breath and agitation. Acute encephalopathy. Recently smoked crack. Initial encounter.  EXAM: CT HEAD WITHOUT CONTRAST  TECHNIQUE: Contiguous axial images were obtained from the base of the skull through the vertex without intravenous contrast.  COMPARISON:  CT of the head performed 10/19/2013  FINDINGS: There is no evidence of acute infarction, mass lesion, or intra- or extra-axial hemorrhage on CT.  The posterior fossa, including the cerebellum, brainstem and fourth ventricle, is within normal limits. The third and lateral ventricles, and basal ganglia are unremarkable in appearance. The cerebral hemispheres are symmetric in appearance, with normal gray-white differentiation. No mass effect or midline shift is seen.  There is no evidence of fracture;  visualized osseous structures are unremarkable in appearance. The orbits are within normal limits. The paranasal sinuses and mastoid air cells are well-aerated. No significant soft tissue abnormalities are seen.  IMPRESSION: Unremarkable noncontrast CT of the head.   Electronically Signed   By: Roanna Raider M.D.   On: 02/03/2015 00:16   Ct Angio Chest Pe W/cm &/or Wo Cm  02/02/2015   CLINICAL DATA:  Dyspnea and agitation.  EXAM: CT ANGIOGRAPHY CHEST WITH CONTRAST  TECHNIQUE: Multidetector CT imaging of the chest was performed using the standard protocol during bolus administration of intravenous contrast. Multiplanar CT image reconstructions and MIPs were obtained to evaluate the vascular anatomy.  CONTRAST:  OMNIPAQUE IOHEXOL 350 MG/ML SOLN  COMPARISON:  02/26/2013, 11/24/2014  FINDINGS: Cardiovascular: There is good opacification of the pulmonary arteries. There is no pulmonary embolism. The thoracic aorta is normal in caliber and intact.  Lungs: There is a 2.2 cm circumscribed ovoid mass at the diaphragmatic surface of the right base. On coronal and sagittal reconstructions it appears to contain fat. This is unchanged from 02/26/2013 and most likely is benign. The lungs are otherwise clear.  Central airways: Patent  Effusions: None  Lymphadenopathy: None  Esophagus: Unremarkable  Upper abdomen: No significant abnormality no significant abnormality.  Musculoskeletal: None.  Review of the MIP images confirms the above findings.  IMPRESSION: 1. Negative for pulmonary embolus 2. No acute findings are evident in the chest. 3. Benign appearing 2.2 cm circumscribed pleural based mass of the right diaphragmatic surface, unchanged from 02/26/2013. It appears to contain fat. No specific follow-up is necessary.   Electronically Signed   By: Ellery Plunk M.D.   On: 02/02/2015 22:28   Mr Shirlee Latch Wo Contrast  02/27/2015   CLINICAL DATA:  54 year old hypertensive male with atrial fibrillation and history of  substance abuse presenting with slurred speech and left arm numbness. Subsequent encounter.  EXAM: MRI HEAD WITHOUT CONTRAST  MRA HEAD WITHOUT CONTRAST  TECHNIQUE: Multiplanar, multiecho pulse sequences of the brain and surrounding structures were obtained without intravenous contrast. Angiographic images of the head were obtained using MRA technique without contrast.  COMPARISON:  02/27/2015 head CT.  No comparison brain MR.  FINDINGS: MRI HEAD FINDINGS  No acute infarct.  No intracranial hemorrhage.  Tiny area of encephalomalacia posterior medial left parietal-occipital lobe.  No hydrocephalus.  No intracranial mass lesion noted on this unenhanced exam.  Degenerative changes right C1- occiput articulation with fluid extending away from the joint.  Partially empty non expanded sella.  Cervical medullary junction pineal region unremarkable. Orbital structures within normal limits.  MRA HEAD FINDINGS  Anterior circulation without medium or large size vessel significant stenosis or occlusion. Mild narrowing right middle cerebral artery bifurcation. Mild middle cerebral artery branch vessel irregularity bilaterally.  Codominant vertebral arteries without high-grade stenosis.  Mild irregularity basilar artery without high-grade stenosis.  Nonvisualized anterior inferior cerebellar arteries.  Mild superior cerebellar arteries.  Mild posterior cerebral artery proximal and distal branch vessel irregularity and narrowing.  IMPRESSION: MRI HEAD FINDINGS  No acute infarct.  Tiny area of encephalomalacia posterior medial left parietal-occipital lobe.  Degenerative changes right C1- occiput articulation with fluid extending away from the joint.  MRA HEAD FINDINGS  Mild intracranial atherosclerotic type changes without medium or large size vessel significant stenosis occlusion.   Electronically Signed   By: Lacy Duverney M.D.   On: 02/27/2015 21:16   Mr Brain Wo Contrast  02/27/2015   CLINICAL DATA:  54 year old hypertensive  male with atrial fibrillation and history of substance abuse presenting with slurred speech and left arm numbness. Subsequent encounter.  EXAM: MRI HEAD WITHOUT CONTRAST  MRA HEAD WITHOUT CONTRAST  TECHNIQUE: Multiplanar, multiecho pulse sequences of the brain and surrounding structures were obtained without intravenous contrast. Angiographic images of the head were obtained using MRA technique without contrast.  COMPARISON:  02/27/2015 head CT.  No comparison brain MR.  FINDINGS: MRI HEAD FINDINGS  No acute infarct.  No intracranial hemorrhage.  Tiny area of encephalomalacia posterior medial left parietal-occipital lobe.  No hydrocephalus.  No intracranial mass lesion noted on this unenhanced exam.  Degenerative changes right C1- occiput articulation with fluid extending away from the joint.  Partially empty non expanded sella.  Cervical medullary junction pineal region unremarkable. Orbital structures within normal limits.  MRA HEAD FINDINGS  Anterior circulation without medium or large size vessel significant stenosis or  occlusion. Mild narrowing right middle cerebral artery bifurcation. Mild middle cerebral artery branch vessel irregularity bilaterally.  Codominant vertebral arteries without high-grade stenosis.  Mild irregularity basilar artery without high-grade stenosis.  Nonvisualized anterior inferior cerebellar arteries.  Mild superior cerebellar arteries.  Mild posterior cerebral artery proximal and distal branch vessel irregularity and narrowing.  IMPRESSION: MRI HEAD FINDINGS  No acute infarct.  Tiny area of encephalomalacia posterior medial left parietal-occipital lobe.  Degenerative changes right C1- occiput articulation with fluid extending away from the joint.  MRA HEAD FINDINGS  Mild intracranial atherosclerotic type changes without medium or large size vessel significant stenosis occlusion.   Electronically Signed   By: Lacy Duverney M.D.   On: 02/27/2015 21:16   Dg Chest Port 1 View  02/02/2015    CLINICAL DATA:  Shortness of breath  EXAM: PORTABLE CHEST - 1 VIEW  COMPARISON:  01/14/2015  FINDINGS: Diffuse interstitial opacity with cephalized blood flow. No interstitial edema or effusion. No collapse or consolidation. Normal heart size and mediastinal contours.  IMPRESSION: Pulmonary venous congestion.   Electronically Signed   By: Marnee Spring M.D.   On: 02/02/2015 18:28   LHC  Conclusion     Normal coronary arteries  Severe aortic stenosis with a calculated aortic valve area of 0.74 cm based upon a peak transaortic valve gradiant of 57 mmHg and mean pressure of 47 mmHg. Cardiac output was 5.7 L/m.  Normal left ventricular systolic function. Left ventricular end-diastolic pressure is also normal.  Normal pulmonary artery pressures. RECOMMENDATIONS:   Per treating team     ASSESSMENT AND PLAN  54 y.o. disabled (hip DJD) male with history of severe aortic stenosis by recent echo, pulmonary embolism, recent polysubstance abuse, COPD, PAF on Xarelto (though he has not had any for weeks) and HTN who presented to the ED for episode of left sided facial droop and slurred speech, now resolved. Cardiology was consulted for elevated troponin.   TIA- sx resolved. Per primary team.   NSTEMI -- pk troponin 0.29 -- s/p LHC on 02/28/15 which revealed normal coronary arteries and normal LVEF. It did show severe AS.   Severe aortic stenosis- LHC with severe aortic stenosis with a calculated aortic valve area of 0.74 cm based upon a peak transaortic valve gradiant of 57 mmHg and mean pressure of 47 mmHg. Cardiac output was 5.7 L/m. --  Appreciate Dr. Sharee Pimple evaluation.  Had panorex suggestive of dental carries; awaiting dental evaluation. For AVR; timing per Dr. Laneta Simmers  Chronic Atrial Fibrillation - CHADSVASCs score of 1 (HTN). He is on Xarelto for afib and PE by his cardiologist, Dr. Dot Been at [facility]. Encouraged compliance of Xarelto. Held for cath and on heparin. Will continue to  hold with possible upcoming surgery -- He has been treated with amiodarone and is maintaining NSR -- His digoxin was discontinued as this might making AS worse.   HTN- not currently being treated and BP stable  Polysubstance abuse- last admission he was positive for benzos and cocaine. UDS negative this admission. He has been sober for 3 weeks. He was living in a half way house but he will not be going back there and plans to be homeless.    History of pulmonary embolism- cont heparin for now. He was on Xarelto prior to admission but was not complaint with this.  Tobacco abuse- smokes 1PPD: counseled on cessation    Signed, Janetta Hora PA-C    Pager [phone]  Patient seen and examined. Agree with assessment  and plan. No chest pain. Ok for [employer] today. To be started back on Xarelto with planned dental extractions on 9/8 under general anesthesia with Dr. Elton Sin, with last dose of xarelto on 9/5. Plan to see Dr. Laneta Simmers on 9/12 to arrange AVR.   Lennette Bihari, MD, [facility] 03/03/2015 12:52 PM

## 2015-03-03 NOTE — Consult Note (Signed)
DENTAL CONSULTATION  Date of Consultation:  03/03/2015 Patient Name:   Donald [name] Date of Birth:   01/29/1961 Medical Record Number: [id number]  VITALS: BP 144/97 mmHg  Pulse 79  Temp(Src) 98.2 F (36.8 C) (Oral)  Resp 18  Ht 5\' 11"  (1.803 m)  Wt 211 lb 9.6 oz (95.981 kg)  BMI 29.53 kg/m2  SpO2 94%  CHIEF COMPLAINT: The patient was referred by Dr. Laneta Simmers for dental consultation.  HPI: Donald [name]  Is a 54 year old male recently diagnosed with severe aortic stenosis. Patient with anticipated aortic valve replacement. Patient now seen as part of a medically necessary pre-aortic valve replacement dental protocol examination.  Patient with a history of intermittent toothache symptoms of the upper right molar. The patient describes the pain as being sharp and spontaneous in nature. Patient is currently not hurting him today. Pain has reached an intensity of 8 out of 10 previously. Patient has not seen a dentist " for a long time". Patient denies having any partial dentures.   PROBLEM LIST: Patient Active Problem List   Diagnosis Date Noted  . Caries   . Stroke with cerebral ischemia   . Polysubstance abuse 02/28/2015  . Chronic anticoagulation-Xarelto 02/28/2015  . Non compliance w medication regimen 02/28/2015  . TIA (transient ischemic attack) 02/27/2015  . COPD exacerbation 02/27/2015  . Tobacco abuse 02/27/2015  . Chronic arthritis 02/27/2015  . Benign essential HTN 02/27/2015  . NSTEMI (non-ST elevated myocardial infarction)   . Severe aortic stenosis   . Acute encephalopathy 02/03/2015  . SOB (shortness of breath) 02/03/2015  . Elevated troponin 02/03/2015  . History of pulmonary embolism 02/03/2015  . Chronic atrial fibrillation 02/03/2015  . Aortic stenosis 02/03/2015  . Chest pain 02/02/2015  . Cocaine dependence 07/30/2012  . Alcohol dependence 07/30/2012    PMH: Past Medical History  Diagnosis Date  . Substance abuse   . A-fib   . Hypertension   .  Arthritis   . COPD (chronic obstructive pulmonary disease)     PSH: Past Surgical History  Procedure Laterality Date  . Tonsillectomy    . Cardiac catheterization N/A 02/28/2015    Procedure: Right/Left Heart Cath and Coronary Angiography;  Surgeon: Lyn Records, MD;  Location: [facility] INVASIVE CV LAB;  Service: Cardiovascular;  Laterality: N/A;    ALLERGIES: No Known Allergies  MEDICATIONS: Current Facility-Administered Medications  Medication Dose Route Frequency Provider Last Rate Last Dose  . 0.9 %  sodium chloride infusion  250 mL Intravenous PRN Lyn Records, MD      . acetaminophen (TYLENOL) tablet 650 mg  650 mg Oral Q4H PRN Vassie Loll, MD   650 mg at 03/01/15 2312   Or  . acetaminophen (TYLENOL) suppository 650 mg  650 mg Rectal Q4H PRN Vassie Loll, MD      . amiodarone (PACERONE) tablet 200 mg  200 mg Oral Daily Vassie Loll, MD   200 mg at 03/02/15 0804  . aspirin chewable tablet 81 mg  81 mg Oral Daily Lyn Records, MD   81 mg at 03/02/15 0800  . atorvastatin (LIPITOR) tablet 80 mg  80 mg Oral Q0600 Bhavinkumar Bhagat, PA   80 mg at 03/03/15 0621  . budesonide (PULMICORT) nebulizer solution 0.25 mg  0.25 mg Nebulization BID Vassie Loll, MD   0.25 mg at 03/03/15 [phone]  . guaiFENesin (MUCINEX) 12 hr tablet 600 mg  600 mg Oral BID Vassie Loll, MD   600 mg at 03/02/15 2023  .  heparin ADULT infusion 100 units/mL (25000 units/250 mL)  1,750 Units/hr Intravenous Continuous Sallee Provencal, RPH 16 mL/hr at 03/02/15 2253 1,600 Units/hr at 03/02/15 2253  . Influenza vac split quadrivalent PF (FLUARIX) injection 0.5 mL  0.5 mL Intramuscular Tomorrow-1000 Vassie Loll, MD      . ipratropium-albuterol (DUONEB) 0.5-2.5 (3) MG/3ML nebulizer solution 3 mL  3 mL Nebulization QID Zannie Cove, MD   3 mL at 03/03/15 [phone]  . nicotine (NICODERM CQ - dosed in mg/24 hours) patch 14 mg  14 mg Transdermal Daily Vassie Loll, MD   14 mg at 03/02/15 0804  . ondansetron (ZOFRAN) injection  4 mg  4 mg Intravenous Q6H PRN Lyn Records, MD      . sodium chloride 0.9 % injection 3 mL  3 mL Intravenous Q12H Lyn Records, MD   3 mL at 03/02/15 0805  . sodium chloride 0.9 % injection 3 mL  3 mL Intravenous PRN Lyn Records, MD        LABS: Lab Results  Component Value Date   WBC 6.5 03/03/2015   HGB 14.4 03/03/2015   HCT 43.5 03/03/2015   MCV 88.8 03/03/2015   PLT 179 03/03/2015      Component Value Date/Time   NA 138 03/02/2015 0938   K 3.7 03/02/2015 0938   CL 105 03/02/2015 0938   CO2 27 03/02/2015 0938   GLUCOSE 202* 03/02/2015 0938   BUN 14 03/02/2015 0938   CREATININE 1.16 03/02/2015 0938   CALCIUM 8.8* 03/02/2015 0938   GFRNONAA >60 03/02/2015 0938   GFRAA >60 03/02/2015 0938   Lab Results  Component Value Date   INR 1.06 02/27/2015   INR 1.78* 02/02/2015   INR 1.56* 08/03/2012   No results found for: PTT  SOCIAL HISTORY: Social History   Social History  . Marital Status: Single    Spouse Name: N/A  . Number of Children: N/A  . Years of Education: N/A   Occupational History  . Not on file.   Social History Main Topics  . Smoking status: Current Some Day Smoker -- 2.00 packs/day for 44 years    Types: Cigarettes  . Smokeless tobacco: Not on file  . Alcohol Use: Yes  . Drug Use: 7.00 per week    Special: Cocaine  . Sexual Activity: Not on file     Comment: crack cocaine this morning at 0300   Other Topics Concern  . Not on file   Social History Narrative    FAMILY HISTORY: Family History  Problem Relation Age of Onset  . Family history unknown: Yes    REVIEW OF SYSTEMS: Reviewed from chart for this admission.  DENTAL HISTORY: CHIEF COMPLAINT: The patient was referred by Dr. Laneta Simmers for dental consultation.  HPI: Donald [name]  Is a 54 year old male recently diagnosed with severe aortic stenosis. Patient with anticipated aortic valve replacement. Patient now seen as part of a medically necessary pre-aortic valve replacement  dental protocol examination.  Patient with a history of intermittent toothache symptoms of the upper right molar. The patient describes the pain as being sharp and spontaneous in nature. Patient is currently not hurting him today. Pain has reached an intensity of 8 out of 10 previously. Patient has not seen a dentist " for a long time". Patient denies having any partial dentures.   DENTAL EXAMINATION: GENERAL: The patient is a well-developed, well-nourished male in no acute distress. HEAD AND NECK: There is no palpable submandibular lymphadenopathy. The  patient denies acute TMJ symptoms. INTRAORAL EXAM: Patient has normal saliva. There is no evidence of oral abscess formation. DENTITION: Patient is missing tooth numbers 1, 3, 4, 6, 711, 12, 13, 14, 15, 18, 19, 30, 31, and 32. PERIODONTAL: Patient has chronic periodontitis with plaque accumulations, generalized gingival recession, and tooth mobility. There is incipient to moderate bone loss noted. DENTAL CARIES/SUBOPTIMAL RESTORATIONS: Multiple dental caries are noted. ENDODONTIC: Patient with a history of acute pulpitis symptoms coming from tooth #2. Incipient periapical pathology and radiolucency is noted. CROWN AND BRIDGE: No crown or bridge restorations are noted. PROSTHODONTIC: Patient denies having any partial dentures. OCCLUSION: Patient has a poor occlusal scheme secondary to multiple missing teeth, supra-eruption and drifting of the unopposed teeth into the edentulous areas, and lack of replacement of missing teeth with clinically acceptable dental prostheses.  RADIOGRAPHIC INTERPRETATION: An orthopantogram was taken on 03/01/2015. There are multiple missing teeth. There is moderate horizontal and vertical bone loss. There is supra-eruption and drifting of the unopposed teeth into the edentulous areas. Multiple dental caries are noted. Incipient periapical pathology is noted at the apex of tooth #2.  ASSESSMENTS: 1. Severe aortic  stenosis 2. Pre-heart valve surgery dental protocol 3. Chronic anticoagulation 4. Chronic apical periodontitis 5. History of acute pulpitis 6. Dental caries 7. Chronic periodontitis with bone loss 8. Gingival recession 9. Tooth mobility 10. Multiple missing teeth 11. Supra-eruption and drifting of the unopposed teeth into the edentulous areas 12. Malocclusion 13. History of anticoagulation therapy with risk for bleeding with invasive dental procedures 14. Risk for complications up to and including death due to his overall cardiovascular compromise   PLAN/RECOMMENDATIONS: 1. I discussed the risks, benefits, and complications of various treatment options with the patient in relationship to his medical and dental conditions, anticipated aortic valve replacement, chronic anticoagulation, and risk for endocarditis. We discussed various treatment options to include no treatment, multiple extractions with alveoloplasty, pre-prosthetic surgery as indicated, periodontal therapy, dental restorations, root canal therapy, crown and bridge therapy, implant therapy, and replacement of missing teeth as indicated. The patient currently wishes to proceed with extraction of all maxillary teeth with alveoloplasty and pre-prosthetic surgery and gross debridement of remaining dentition in the operating room with general anesthesia.  This has been scheduled for Thursday, 03/09/2015 at 7:30 AM at [facility].  Patient most likely will be discharged prior to the operating room procedure. If Xarelto is restarted, the Xarelto is to be discontinued after the dose on Monday morning. Patient may need to be admitted after the dental procedures due to history of sleep apnea and CPAP noncompliance.  2. Discussion of findings with medical team and coordination of future medical and dental care as needed.    Charlynne Pander, DDS

## 2015-03-03 NOTE — Clinical Social Work Note (Signed)
Clinical Social Work Assessment  Patient Details  Name: Donald [name] MRN: [id number] Date of Birth: 05-May-1961  Date of referral:  03/03/15               Reason for consult:  Transportation, Housing Concerns/Homelessness                Permission sought to share information with:    Permission granted to share information::     Name::        Agency::     Relationship::     Contact Information:     Housing/Transportation Living arrangements for the past 2 months:  [employer] of Information:  Patient Patient Interpreter Needed:  None Criminal Activity/Legal Involvement Pertinent to Current Situation/Hospitalization:  No - Comment as needed Significant Relationships:  None Lives with:  Self Do you feel safe going back to the place where you live?   (N/A Unable to return to residence he was at [facility]) Need for family participation in patient care:  No (Coment)  Care giving concerns:  Pt needing assistance with housing/shelter resources   Social Worker assessment / plan:  CSW visited pt room a second time. Pt more open to discussing dc plans with CSW today. Pt will be moving his belongings out of boarding house he was at prior to admission and moving to [facility] in [place]. From there, pt reports he is interested in getting into a rehab program. Pt called shelter while CSW at bedside to confirm they have beds available. Shelter reports they should have slots available today but it is first come first serve. Pt is able to begin lining up at 5pm. Pt confirms he has no issues with this process. Pt is requesting assistance to get to [facility]. Pt familiar with bus system and agreeable to taking [facility] bus. CSW will provide pt nurse with bus pass as well as fare needed for [employer] bus to give to pt at time of dc. Pt requesting he be discharged as soon as possible so he is able to retrieve his belongings from place of residence prior to admission. Pt nurse and charge  nurse aware and will contact MD to notify and request DC be completed as early as possible on day of discharge. At this time, pt has no further hospital social work needs. CSW signing off.  Employment status:    Database administrator PT Recommendations:  Not assessed at this time Information / Referral to community resources:  Shelter  Patient/Family's Response to care:  Pt has formulated plan on his own and feels it is best dc plan for himself.  Patient/Family's Understanding of and Emotional Response to Diagnosis, Current Treatment, and Prognosis: Pt has fair insight on condition and asked appropriate questions. Pt emotional response hard to judge as pt frustrated/angry at "the system" during CSW visits and hard to redirect to current hospitalization.  Emotional Assessment Appearance:  Appears older than stated age Attitude/Demeanor/Rapport:  Angry, Complaining Affect (typically observed):  Angry, Blunt Orientation:  Oriented to Self, Oriented to  Time, Oriented to Place, Oriented to Situation Alcohol / Substance use:  Not Applicable Psych involvement (Current and /or in the community):  No (Comment)  Discharge Needs  Concerns to be addressed:  Homelessness Readmission within the last 30 days:  No Current discharge risk:  Homeless Barriers to Discharge:  No Barriers Identified   Artez Regis, LCSWA [phone]

## 2015-03-03 NOTE — Progress Notes (Signed)
ANTICOAGULATION CONSULT NOTE - Follow Up Consult  Pharmacy Consult for heparin Indication: chest pain/ACS  No Known Allergies  Patient Measurements: Height:  (180.3 cm) Weight: 211 lb 9.6 oz (95.981 kg) IBW/kg (Calculated) : 75.3 Heparin Dosing Weight: 93 kg  Vital Signs: Temp: 98.2 F (36.8 C) (09/02 0400) Temp Source: Oral (09/02 0400) BP: 144/97 mmHg (09/02 0400) Pulse Rate: 79 (09/02 0001)  Labs:  Recent Labs  03/01/15 0420 03/01/15 0937 03/02/15 0552 03/02/15 0938 03/03/15 0504  HGB 13.9  --  14.3  --  14.4  HCT 42.2  --  43.2  --  43.5  PLT 162  --  165  --  179  HEPARINUNFRC  --  0.32 0.36  --  0.23*  CREATININE 1.04  --   --  1.16  --     Estimated Creatinine Clearance: 84.1 mL/min (by C-G formula based on Cr of 1.16).   Medications:  Scheduled:  . amiodarone  200 mg Oral Daily  . aspirin  81 mg Oral Daily  . atorvastatin  80 mg Oral Q0600  . budesonide (PULMICORT) nebulizer solution  0.25 mg Nebulization BID  . guaiFENesin  600 mg Oral BID  . Influenza vac split quadrivalent PF  0.5 mL Intramuscular Tomorrow-1000  . ipratropium-albuterol  3 mL Nebulization QID  . nicotine  14 mg Transdermal Daily  . sodium chloride  3 mL Intravenous Q12H   Infusions:  . heparin 1,600 Units/hr (03/02/15 2253)    Assessment: 54 yo male NSTEMI and TIA s/p cath with severe AS and plans for tissue AVR. He was on Xarelto PTA for atrial fibrillation and history of PE.  Possible plans for coumadin post procedure. He is currently receiving anticoagulation with Heparin with an adjusted target range due to TIA.  His heparin level is slightly subtherapeutic this morning.  Goal of Therapy:  Heparin level = 0.3-0.5 Monitor platelets by anticoagulation protocol: Yes   Plan:  Increase Heparin to 1750 units/hr Check heparin level in 6 hours Daily heparin level and CBC  Estella Husk, Pharm.D., BCPS, AAHIVP Clinical Pharmacist Phone: [phone] or [phone] 03/03/2015,  9:41 AM

## 2015-03-07 ENCOUNTER — Encounter (HOSPITAL_COMMUNITY): Payer: Self-pay | Admitting: *Deleted

## 2015-03-07 ENCOUNTER — Telehealth: Payer: Self-pay

## 2015-03-07 NOTE — Telephone Encounter (Signed)
Attempted to contact the patient to check on his status and to confirm his appointment at the [facility].  Call placed to # [phone] (mobile) and left a voice mail message requesting a call back to # [phone] or [phone].  Call then placed to the patient's emergency contact: Jonne Ply (friend)-[phone]. She said that the best way to reach the patient is to call his cell phone and leave a message and he will call back. She noted that he is very good about returning messages.

## 2015-03-08 ENCOUNTER — Encounter (HOSPITAL_COMMUNITY): Payer: Self-pay | Admitting: Vascular Surgery

## 2015-03-08 ENCOUNTER — Telehealth: Payer: Self-pay

## 2015-03-08 NOTE — Progress Notes (Signed)
Anesthesia Chart Review: SAME DAY WORK-UP.  Patient is a 54 year old male scheduled for multiple teeth extractions, alveoloplasty, gross debridement tomorrow by Dr. Kristin Bruins.  He was recently hospitalized on 02/27/15 after a 15-20 second episode of slurred speech and left arm numbness while on the phone with his AA sponsor. He was admitted with right brain TIA likely due to afib (or possible debris from his AV) in the setting of missed Xarelto for 3-4 days. His troponin was also mildly elevated (NSTEMI) at 0.29 (POC) and 0.23. He previously had mildly elevated troponin on 02/02/15 during an admission for acute encephalopathy in the setting of ETOH and cocaine use. UDS was negative 02/27/15 and ETOH < 5. Cardiology recommended cardiac cath which showed normal coronaries and severe AS. He was seen by CT surgery and AVR recommended in the future, but needed dental surgery first. Last in-patient cardiology visit was with Dr. Nicki Guadalajara who is aware of plans for dental extraction on 03/09/15 and recommended patient holding Xarelto preoperatively starting 03/06/15.   History includes smoking (88 pack years), severe AS, PE (within right pulmonary artery) 11/24/14, TIA 02/27/15, polysubstance abuse (ETOH, cocaine; sober since early 01/2015), COPD, afib, GERD, HTN.   PCP None listed - to be seen by Dr. Venetia Night at [facility] in the [facility] Clinic on 03/13/15 Cardiologist Dr. Dot Been (Tobey Grim; see Care Everywhere). Seen by [facility] during 02/27/15 hospitalization.  CT surgeon Dr. Evelene Croon Neurologist is Dr. Marvel Plan  Meds currently listed as albuterol, amiodarone, Augmentin, Lipitor, Symbicort, Combivent, Lopressor, Xarelto.  Chest 2 View 02/27/2015:Evidence of old rib trauma on the left. No edema or consolidation. No pneumothorax.   CTA Chest 02/02/15:  IMPRESSION: 1. Negative for pulmonary embolus 2. No acute findings are evident in the chest. 3. 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 It appears to contain fat. No specific follow-up is necessary.  03/03/15 PFTS: FVC 3.64 (72%), FEV1 1.88 (48%), DLCOunc 20.50 (60%).  CT Head Wo Contrast 02/27/2015:Normal CT of the brain without intravenous contrast.   MRI HEAD8/29/2016: No acute infarct.Tiny area of encephalomalacia posterior medial left parietal-occipital lobe. Degenerative changes right C1- occiput articulation with fluid extending away from the joint.   MRA HEAD 02/27/2015 Mild intracranial atherosclerotic type changes without medium or large size vessel significant stenosis occlusion.   Carotid Doppler8/30/16: There is 1-39% bilateral ICA stenosis. Vertebral artery flow is antegrade.   2D Echocardiogram8/29/16: - Left ventricle: The cavity size was normal. There was mildconcentric hypertrophy. Systolic function was normal. Theestimated ejection fraction was in the range of 50% to 55%.Although no diagnostic regional wall motion abnormality wasidentified, this possibility cannot be completely excluded on thebasis of this study. Doppler parameters are consistent withabnormal left ventricular relaxation (grade 1 diastolicdysfunction). - Aortic valve: Cusp separation was reduced. There was moderate tosevere stenosis. Peak velocity (S): 392 cm/s. Mean gradient (S):35 mm Hg. Valve area (VTI): 0.56 cm^2. Valve area (Vmax): 0.5cm^2. Valve area (Vmean): 0.55 cm^2. Prior echo from earlier thismonth demonstrated severe aortic stenosis (mean gradient ). - Mitral valve: There was trivial regurgitation. - Left atrium: The atrium was mildly dilated. - Right ventricle: The cavity size was normal. Systolic function was normal. - Pulmonary arteries: No complete TR doppler jet so unable to estimate PA systolic pressure. - Systemic veins: IVC was poorly visualized. Impressions: - Normal LV size with mild LV hypertrophy.  EF 55%. Normal RV size and systolic function. Severe aortic  stenosis.  Cardiac cath 02/28/15: - Normal coronary arteries -  Severe aortic stenosis with a calculated aortic valve area of 0.74 cm based upon a peak transaortic valve gradiant of 57 mmHg and mean pressure of 47 mmHg. Cardiac output was 5.7 L/m. - Normal left ventricular systolic function. Left ventricular end-diastolic pressure is also normal. - Normal pulmonary artery pressures.  EKG 02/27/15: Ectopic atrial rhythm, non-specific intraventricular conduction delay, inferior infarct (old), probable anteroseptal infarct (old).   Labs from 02/27/15-03/02/15 noted. A1C 5.9 on 02/28/15. UDS negative 02/27/15 and ETOH < 5.  Patient discharged home 03/03/15. At that time, his internist, cardiologist, dentist, and CT surgery were all on board with plans for dental surgery. Further evaluation by his assigned anesthesiologist on the day of surgery.  Velna Ochs [facility] Short Stay Center/Anesthesiology Phone [phone] 03/08/2015 2:11 PM

## 2015-03-08 NOTE — Telephone Encounter (Signed)
Transitional Care Clinic Post-discharge Follow-Up Phone Call:  Date of Discharge:03/03/2015 Principal Discharge Diagnosis(es): TIA, aortic stenosis, COPD, chronic Afib. Post-discharge Communication: call placed to the patient.   He was pre-occupied with trying to get a car fixed when this CM called him. This CM offered to call him back at a later time and he said there was no need to call back as this call was almost done.  He was also focused on the fact that he has an appointment for dental extractions tomorrow.  Call Completed: Yes                    With Whom: patient Interpreter Needed: No                 Please check all that apply:  X Patient is knowledgeable of his/her condition(s) and/or treatment. X Patient is caring for self at home.  He said that he is currently staying with a friend in [facility] and will be returning to stay with this friend after his procedure tomorrow. He noted that he has been eating like a "king" and has no problem accessing the food that he needs.  ? Patient is receiving assist at home from family and/or caregiver. Family and/or caregiver is knowledgeable of patient's condition(s) and/or treatment. ? Patient is receiving home health services. If so, name of agency.     Medication Reconciliation:     Medication list reviewed with patient. X Patient obtained all discharge medications. If not, why? - He said that he has all but 2 of his medications and is taking them as ordered. He noted that he has not yet picked up his symbicort and combivent.  CM instructed him regarding the importance of obtaining all of his medications. He said that he was not able to go to the pharmacy and get them yet. He noted that he has been taking his antibiotic twice a day. He also said that he has his nebulizer and has no questions about his medications.    Activities of Daily Living:  X Independent ? Needs assist (describe; ? home DME used) ? [facility] (describe, ? home DME  used)   Community resources in place for patient:  X None reported by the patient.  ? Home Health/Home DME ? Assisted Living ? Support Group          Patient Education: Instructed him about the importance of obtaining all of his medication.  Also informed him about the pharmacy services provided at the [facility]. Confirmed his appointment at [facility] for 03/13/15 .  He stated that he is aware of the appointment but also reminded this CM that he has an appointment for dental extractions tomorrow at the hospital. He said that he will be going there today to give blood and then will just stay at the hospital tonight because he is not sure if he would have a ride back to the hospital tomorrow morning. Instructed him to ask to speak to the SW at the hospital about transportation options that might be available to him tomorrow.         Questions/Concerns discussed: He said that he had no other questions/concerns.

## 2015-03-08 NOTE — Anesthesia Preprocedure Evaluation (Addendum)
Anesthesia Evaluation  Patient identified by MRN, date of birth, ID band Patient awake    Reviewed: Allergy & Precautions, NPO status , Patient's Chart, lab work & pertinent test results, reviewed documented beta blocker date and time   Airway Mallampati: II  TM Distance: >3 FB Neck ROM: Full    Dental   Pulmonary COPD, Current Smoker,    breath sounds clear to auscultation       Cardiovascular hypertension, Pt. on medications and Pt. on home beta blockers + Past MI  + dysrhythmias Atrial Fibrillation + Valvular Problems/Murmurs AS  Rhythm:Regular Rate:Normal  Mod-severe AS. EF 50-55%   Neuro/Psych TIACVA    GI/Hepatic Neg liver ROS, GERD  ,  Endo/Other  negative endocrine ROS  Renal/GU negative Renal ROS     Musculoskeletal   Abdominal   Peds  Hematology negative hematology ROS (+)   Anesthesia Other Findings   Reproductive/Obstetrics                           Lab Results  Component Value Date   WBC 6.5 03/03/2015   HGB 14.4 03/03/2015   HCT 43.5 03/03/2015   MCV 88.8 03/03/2015   PLT 179 03/03/2015   Lab Results  Component Value Date   CREATININE 1.16 03/02/2015   BUN 14 03/02/2015   NA 138 03/02/2015   K 3.7 03/02/2015   CL 105 03/02/2015   CO2 27 03/02/2015   Lab Results  Component Value Date   INR 1.06 02/27/2015   INR 1.78* 02/02/2015   INR 1.56* 08/03/2012    Anesthesia Physical Anesthesia Plan  ASA: IV  Anesthesia Plan: General   Post-op Pain Management:    Induction: Intravenous  Airway Management Planned: Oral ETT  Additional Equipment: Arterial line  Intra-op Plan:   Post-operative Plan: Extubation in OR  Informed Consent: I have reviewed the patients History and Physical, chart, labs and discussed the procedure including the risks, benefits and alternatives for the proposed anesthesia with the patient or authorized representative who has indicated  his/her understanding and acceptance.     Plan Discussed with: CRNA  Anesthesia Plan Comments: (Ok for anesthesia)       Anesthesia Quick Evaluation

## 2015-03-09 ENCOUNTER — Observation Stay (HOSPITAL_COMMUNITY)
Admission: RE | Admit: 2015-03-09 | Discharge: 2015-03-10 | Disposition: A | Discharge status: Home / Self Care | Payer: Medicare HMO | Source: Ambulatory Visit | Attending: Interventional Cardiology | Admitting: Interventional Cardiology

## 2015-03-09 ENCOUNTER — Ambulatory Visit (HOSPITAL_COMMUNITY): Payer: Medicare HMO | Admitting: Certified Registered"

## 2015-03-09 ENCOUNTER — Encounter (HOSPITAL_COMMUNITY)
Admission: RE | Disposition: A | Discharge status: Home / Self Care | Payer: Self-pay | Source: Ambulatory Visit | Attending: Interventional Cardiology

## 2015-03-09 ENCOUNTER — Encounter (HOSPITAL_COMMUNITY): Payer: Self-pay | Admitting: *Deleted

## 2015-03-09 ENCOUNTER — Inpatient Hospital Stay: Payer: Self-pay | Admitting: Family Medicine

## 2015-03-09 DIAGNOSIS — M13851 Other specified arthritis, right hip: Secondary | ICD-10-CM | POA: Diagnosis not present

## 2015-03-09 DIAGNOSIS — E785 Hyperlipidemia, unspecified: Secondary | ICD-10-CM | POA: Diagnosis not present

## 2015-03-09 DIAGNOSIS — I350 Nonrheumatic aortic (valve) stenosis: Secondary | ICD-10-CM

## 2015-03-09 DIAGNOSIS — J441 Chronic obstructive pulmonary disease with (acute) exacerbation: Secondary | ICD-10-CM | POA: Diagnosis not present

## 2015-03-09 DIAGNOSIS — K029 Dental caries, unspecified: Secondary | ICD-10-CM | POA: Diagnosis not present

## 2015-03-09 DIAGNOSIS — K045 Chronic apical periodontitis: Secondary | ICD-10-CM

## 2015-03-09 DIAGNOSIS — R200 Anesthesia of skin: Secondary | ICD-10-CM | POA: Insufficient documentation

## 2015-03-09 DIAGNOSIS — Z7901 Long term (current) use of anticoagulants: Secondary | ICD-10-CM

## 2015-03-09 DIAGNOSIS — Z86711 Personal history of pulmonary embolism: Secondary | ICD-10-CM | POA: Insufficient documentation

## 2015-03-09 DIAGNOSIS — I10 Essential (primary) hypertension: Secondary | ICD-10-CM | POA: Insufficient documentation

## 2015-03-09 DIAGNOSIS — K0530 Chronic periodontitis, unspecified: Secondary | ICD-10-CM

## 2015-03-09 DIAGNOSIS — K083 Retained dental root: Secondary | ICD-10-CM | POA: Insufficient documentation

## 2015-03-09 DIAGNOSIS — R4781 Slurred speech: Secondary | ICD-10-CM | POA: Insufficient documentation

## 2015-03-09 DIAGNOSIS — Z7951 Long term (current) use of inhaled steroids: Secondary | ICD-10-CM | POA: Insufficient documentation

## 2015-03-09 DIAGNOSIS — I482 Chronic atrial fibrillation: Secondary | ICD-10-CM | POA: Diagnosis not present

## 2015-03-09 DIAGNOSIS — F17210 Nicotine dependence, cigarettes, uncomplicated: Secondary | ICD-10-CM | POA: Diagnosis not present

## 2015-03-09 DIAGNOSIS — K040 Pulpitis: Secondary | ICD-10-CM

## 2015-03-09 HISTORY — DX: Sleep apnea, unspecified: G47.30

## 2015-03-09 HISTORY — DX: Gastro-esophageal reflux disease without esophagitis: K21.9

## 2015-03-09 HISTORY — PX: MULTIPLE EXTRACTIONS WITH ALVEOLOPLASTY: SHX5342

## 2015-03-09 HISTORY — DX: Reserved for inherently not codable concepts without codable children: IMO0001

## 2015-03-09 HISTORY — DX: Other pulmonary embolism without acute cor pulmonale: I26.99

## 2015-03-09 HISTORY — DX: Cardiac arrhythmia, unspecified: I49.9

## 2015-03-09 HISTORY — DX: Cerebral infarction, unspecified: I63.9

## 2015-03-09 HISTORY — DX: Constipation, unspecified: K59.00

## 2015-03-09 LAB — COMPREHENSIVE METABOLIC PANEL
ALBUMIN: 3.7 g/dL (ref 3.5–5.0)
ALT: 32 U/L (ref 17–63)
AST: 17 U/L (ref 15–41)
Alkaline Phosphatase: 78 U/L (ref 38–126)
Anion gap: 9 (ref 5–15)
BUN: 26 mg/dL — AB (ref 6–20)
CHLORIDE: 103 mmol/L (ref 101–111)
CO2: 25 mmol/L (ref 22–32)
CREATININE: 1.14 mg/dL (ref 0.61–1.24)
Calcium: 8.7 mg/dL — ABNORMAL LOW (ref 8.9–10.3)
GFR calc Af Amer: >60 mL/min (ref >60)
Glucose, Bld: 135 mg/dL — ABNORMAL HIGH (ref 65–99)
POTASSIUM: 5 mmol/L (ref 3.5–5.1)
SODIUM: 137 mmol/L (ref 135–145)
Total Bilirubin: 0.5 mg/dL (ref 0.3–1.2)
Total Protein: 6.6 g/dL (ref 6.5–8.1)

## 2015-03-09 LAB — PROTIME-INR
INR: 1.02 (ref 0.00–1.49)
PROTHROMBIN TIME: 13.6 s (ref 11.6–15.2)

## 2015-03-09 LAB — SURGICAL PCR SCREEN
MRSA, PCR: NEGATIVE
STAPHYLOCOCCUS AUREUS: NEGATIVE

## 2015-03-09 SURGERY — MULTIPLE EXTRACTION WITH ALVEOLOPLASTY
Anesthesia: General

## 2015-03-09 MED ORDER — ETOMIDATE 2 MG/ML IV SOLN
INTRAVENOUS | Status: AC
  Filled 2015-03-09: qty 10

## 2015-03-09 MED ORDER — STERILE WATER FOR IRRIGATION IR SOLN
Status: DC | PRN
  Administered 2015-03-09: 600 mL

## 2015-03-09 MED ORDER — HYDROMORPHONE HCL 1 MG/ML IJ SOLN
INTRAMUSCULAR | Status: AC
  Filled 2015-03-09: qty 1

## 2015-03-09 MED ORDER — OXYMETAZOLINE HCL 0.05 % NA SOLN
NASAL | Status: AC
  Filled 2015-03-09: qty 15

## 2015-03-09 MED ORDER — EPHEDRINE SULFATE 50 MG/ML IJ SOLN
INTRAMUSCULAR | Status: DC | PRN
  Administered 2015-03-09: 10 mg via INTRAVENOUS

## 2015-03-09 MED ORDER — LIDOCAINE HCL (CARDIAC) 20 MG/ML IV SOLN
INTRAVENOUS | Status: AC
  Filled 2015-03-09: qty 5

## 2015-03-09 MED ORDER — ISOPROPYL ALCOHOL 70 % SOLN
Status: DC | PRN
  Administered 2015-03-09: 1 via TOPICAL

## 2015-03-09 MED ORDER — CEFAZOLIN SODIUM-DEXTROSE 2-3 GM-% IV SOLR
INTRAVENOUS | Status: AC
  Filled 2015-03-09: qty 50

## 2015-03-09 MED ORDER — ONDANSETRON HCL 4 MG/2ML IJ SOLN
INTRAMUSCULAR | Status: AC
  Filled 2015-03-09: qty 2

## 2015-03-09 MED ORDER — LIDOCAINE HCL (CARDIAC) 20 MG/ML IV SOLN
INTRAVENOUS | Status: DC | PRN
  Administered 2015-03-09: 50 mg via INTRAVENOUS

## 2015-03-09 MED ORDER — FENTANYL CITRATE (PF) 250 MCG/5ML IJ SOLN
INTRAMUSCULAR | Status: AC
  Filled 2015-03-09: qty 5

## 2015-03-09 MED ORDER — ONDANSETRON HCL 4 MG/2ML IJ SOLN
INTRAMUSCULAR | Status: DC | PRN
  Administered 2015-03-09: 4 mg via INTRAVENOUS

## 2015-03-09 MED ORDER — SODIUM CHLORIDE 0.9 % IJ SOLN
INTRAMUSCULAR | Status: AC
  Filled 2015-03-09: qty 10

## 2015-03-09 MED ORDER — EPHEDRINE SULFATE 50 MG/ML IJ SOLN
INTRAMUSCULAR | Status: AC
  Filled 2015-03-09: qty 1

## 2015-03-09 MED ORDER — ALPRAZOLAM 0.25 MG PO TABS
0.2500 mg | ORAL_TABLET | Freq: Two times a day (BID) | ORAL | Status: DC | PRN
  Administered 2015-03-09 (×2): 0.25 mg via ORAL
  Filled 2015-03-09 (×2): qty 1

## 2015-03-09 MED ORDER — ALBUTEROL SULFATE (2.5 MG/3ML) 0.083% IN NEBU
2.5000 mg | INHALATION_SOLUTION | Freq: Four times a day (QID) | RESPIRATORY_TRACT | Status: DC | PRN
  Administered 2015-03-09: 2.5 mg via RESPIRATORY_TRACT

## 2015-03-09 MED ORDER — GLYCOPYRROLATE 0.2 MG/ML IJ SOLN
INTRAMUSCULAR | Status: AC
  Filled 2015-03-09: qty 1

## 2015-03-09 MED ORDER — STERILE WATER FOR INJECTION IJ SOLN
INTRAMUSCULAR | Status: AC
  Filled 2015-03-09: qty 10

## 2015-03-09 MED ORDER — OXYCODONE HCL 5 MG/5ML PO SOLN: 5.0000 mg | Freq: Once | ORAL | Status: DC | PRN

## 2015-03-09 MED ORDER — NITROGLYCERIN 0.4 MG SL SUBL: 0.4000 mg | SUBLINGUAL_TABLET | SUBLINGUAL | Status: DC | PRN

## 2015-03-09 MED ORDER — 0.9 % SODIUM CHLORIDE (POUR BTL) OPTIME
TOPICAL | Status: DC | PRN
  Administered 2015-03-09: 1000 mL

## 2015-03-09 MED ORDER — LIDOCAINE-EPINEPHRINE 2 %-1:100000 IJ SOLN
INTRAMUSCULAR | Status: AC
  Filled 2015-03-09: qty 10.2

## 2015-03-09 MED ORDER — PROMETHAZINE HCL 25 MG/ML IJ SOLN: 6.2500 mg | INTRAMUSCULAR | Status: DC | PRN

## 2015-03-09 MED ORDER — DEXTROSE 5 % IV SOLN
10.0000 mg | INTRAVENOUS | Status: DC | PRN
  Administered 2015-03-09: 20 ug/min via INTRAVENOUS

## 2015-03-09 MED ORDER — ROCURONIUM BROMIDE 50 MG/5ML IV SOLN
INTRAVENOUS | Status: AC
  Filled 2015-03-09: qty 1

## 2015-03-09 MED ORDER — RIVAROXABAN 20 MG PO TABS: 20.0000 mg | ORAL_TABLET | Freq: Every day | ORAL | Status: DC

## 2015-03-09 MED ORDER — ENOXAPARIN SODIUM 40 MG/0.4ML ~~LOC~~ SOLN: 40.0000 mg | SUBCUTANEOUS | Status: DC

## 2015-03-09 MED ORDER — ALBUTEROL SULFATE HFA 108 (90 BASE) MCG/ACT IN AERS
INHALATION_SPRAY | RESPIRATORY_TRACT | Status: AC
  Filled 2015-03-09: qty 6.7

## 2015-03-09 MED ORDER — IPRATROPIUM-ALBUTEROL 20-100 MCG/ACT IN AERS: 1.0000 | INHALATION_SPRAY | Freq: Four times a day (QID) | RESPIRATORY_TRACT | Status: DC | PRN

## 2015-03-09 MED ORDER — PHENYLEPHRINE HCL 10 MG/ML IJ SOLN
INTRAMUSCULAR | Status: DC | PRN
  Administered 2015-03-09: 80 ug via INTRAVENOUS

## 2015-03-09 MED ORDER — AMINOCAPROIC ACID SOLUTION 5% (50 MG/ML)
10.0000 mL | ORAL | Status: AC
  Administered 2015-03-09 (×5): 10 mL via ORAL
  Filled 2015-03-09: qty 100

## 2015-03-09 MED ORDER — SUCCINYLCHOLINE CHLORIDE 20 MG/ML IJ SOLN
INTRAMUSCULAR | Status: AC
  Filled 2015-03-09: qty 1

## 2015-03-09 MED ORDER — VECURONIUM BROMIDE 10 MG IV SOLR
INTRAVENOUS | Status: AC
  Filled 2015-03-09: qty 10

## 2015-03-09 MED ORDER — PROPOFOL 10 MG/ML IV BOLUS
INTRAVENOUS | Status: DC | PRN
  Administered 2015-03-09: 20 mg via INTRAVENOUS
  Administered 2015-03-09: 110 mg via INTRAVENOUS

## 2015-03-09 MED ORDER — MIDAZOLAM HCL 5 MG/5ML IJ SOLN
INTRAMUSCULAR | Status: DC | PRN
  Administered 2015-03-09: 2 mg via INTRAVENOUS

## 2015-03-09 MED ORDER — PROPOFOL 10 MG/ML IV BOLUS
INTRAVENOUS | Status: AC
  Filled 2015-03-09: qty 20

## 2015-03-09 MED ORDER — LIDOCAINE-EPINEPHRINE 2 %-1:100000 IJ SOLN
INTRAMUSCULAR | Status: DC | PRN
  Administered 2015-03-09 (×5): 1.7 mL via INTRADERMAL

## 2015-03-09 MED ORDER — IPRATROPIUM-ALBUTEROL 0.5-2.5 (3) MG/3ML IN SOLN
3.0000 mL | Freq: Four times a day (QID) | RESPIRATORY_TRACT | Status: DC | PRN
  Administered 2015-03-09: 3 mL via RESPIRATORY_TRACT
  Filled 2015-03-09: qty 3

## 2015-03-09 MED ORDER — OXYCODONE-ACETAMINOPHEN 5-325 MG PO TABS
1.0000 | ORAL_TABLET | ORAL | Status: DC | PRN
  Administered 2015-03-09 – 2015-03-10 (×5): 2 via ORAL
  Filled 2015-03-09 (×5): qty 2

## 2015-03-09 MED ORDER — ZOLPIDEM TARTRATE 5 MG PO TABS: 5.0000 mg | ORAL_TABLET | Freq: Every evening | ORAL | Status: DC | PRN

## 2015-03-09 MED ORDER — ATORVASTATIN CALCIUM 40 MG PO TABS
40.0000 mg | ORAL_TABLET | Freq: Every day | ORAL | Status: DC
  Administered 2015-03-10: 40 mg via ORAL
  Filled 2015-03-09 (×2): qty 1

## 2015-03-09 MED ORDER — MORPHINE SULFATE (PF) 2 MG/ML IV SOLN
2.0000 mg | INTRAVENOUS | Status: DC | PRN
  Administered 2015-03-09: 4 mg via INTRAVENOUS
  Filled 2015-03-09: qty 2

## 2015-03-09 MED ORDER — CEFAZOLIN SODIUM-DEXTROSE 2-3 GM-% IV SOLR
2.0000 g | Freq: Once | INTRAVENOUS | Status: AC
  Administered 2015-03-09: 2 g via INTRAVENOUS

## 2015-03-09 MED ORDER — ALBUTEROL SULFATE (2.5 MG/3ML) 0.083% IN NEBU: 2.5000 mg | INHALATION_SOLUTION | Freq: Four times a day (QID) | RESPIRATORY_TRACT | Status: DC | PRN

## 2015-03-09 MED ORDER — ALBUTEROL SULFATE (2.5 MG/3ML) 0.083% IN NEBU
INHALATION_SOLUTION | RESPIRATORY_TRACT | Status: AC
  Filled 2015-03-09: qty 3

## 2015-03-09 MED ORDER — LACTATED RINGERS IV SOLN: INTRAVENOUS | Status: DC

## 2015-03-09 MED ORDER — ONDANSETRON HCL 4 MG/2ML IJ SOLN
4.0000 mg | Freq: Four times a day (QID) | INTRAMUSCULAR | Status: DC | PRN
  Administered 2015-03-09 (×2): 4 mg via INTRAVENOUS
  Filled 2015-03-09 (×2): qty 2

## 2015-03-09 MED ORDER — BUDESONIDE-FORMOTEROL FUMARATE 160-4.5 MCG/ACT IN AERO
2.0000 | INHALATION_SPRAY | Freq: Two times a day (BID) | RESPIRATORY_TRACT | Status: DC
  Administered 2015-03-09 – 2015-03-10 (×2): 2 via RESPIRATORY_TRACT
  Filled 2015-03-09: qty 6

## 2015-03-09 MED ORDER — BUPIVACAINE-EPINEPHRINE (PF) 0.5% -1:200000 IJ SOLN
INTRAMUSCULAR | Status: AC
  Filled 2015-03-09: qty 3.6

## 2015-03-09 MED ORDER — OXYCODONE HCL 5 MG PO TABS: 5.0000 mg | ORAL_TABLET | Freq: Once | ORAL | Status: DC | PRN

## 2015-03-09 MED ORDER — AMOXICILLIN-POT CLAVULANATE 875-125 MG PO TABS
1.0000 | ORAL_TABLET | Freq: Two times a day (BID) | ORAL | Status: DC
  Administered 2015-03-09 – 2015-03-10 (×2): 1 via ORAL
  Filled 2015-03-09 (×4): qty 1

## 2015-03-09 MED ORDER — ETOMIDATE 2 MG/ML IV SOLN
INTRAVENOUS | Status: DC | PRN
  Administered 2015-03-09: 10 mg via INTRAVENOUS

## 2015-03-09 MED ORDER — PHENYLEPHRINE 40 MCG/ML (10ML) SYRINGE FOR IV PUSH (FOR BLOOD PRESSURE SUPPORT)
PREFILLED_SYRINGE | INTRAVENOUS | Status: AC
  Filled 2015-03-09: qty 10

## 2015-03-09 MED ORDER — GLYCOPYRROLATE 0.2 MG/ML IJ SOLN
INTRAMUSCULAR | Status: DC | PRN
  Administered 2015-03-09: 0.2 mg via INTRAVENOUS

## 2015-03-09 MED ORDER — AMIODARONE HCL 200 MG PO TABS
200.0000 mg | ORAL_TABLET | Freq: Every day | ORAL | Status: DC
  Administered 2015-03-10: 200 mg via ORAL
  Filled 2015-03-09 (×2): qty 1

## 2015-03-09 MED ORDER — LACTATED RINGERS IV SOLN
INTRAVENOUS | Status: DC | PRN
  Administered 2015-03-09: 07:00:00 via INTRAVENOUS

## 2015-03-09 MED ORDER — FENTANYL CITRATE (PF) 100 MCG/2ML IJ SOLN
INTRAMUSCULAR | Status: DC | PRN
  Administered 2015-03-09: 100 ug via INTRAVENOUS
  Administered 2015-03-09 (×2): 50 ug via INTRAVENOUS

## 2015-03-09 MED ORDER — HEMOSTATIC AGENTS (NO CHARGE) OPTIME
TOPICAL | Status: DC | PRN
  Administered 2015-03-09: 1 via TOPICAL

## 2015-03-09 MED ORDER — HYDROMORPHONE HCL 1 MG/ML IJ SOLN
0.2500 mg | INTRAMUSCULAR | Status: DC | PRN
  Administered 2015-03-09 (×2): 0.5 mg via INTRAVENOUS

## 2015-03-09 MED ORDER — MUPIROCIN 2 % EX OINT
1.0000 "application " | TOPICAL_OINTMENT | Freq: Once | CUTANEOUS | Status: AC
  Administered 2015-03-09: 1 via TOPICAL

## 2015-03-09 MED ORDER — MUPIROCIN 2 % EX OINT
TOPICAL_OINTMENT | CUTANEOUS | Status: AC
  Filled 2015-03-09: qty 22

## 2015-03-09 MED ORDER — SUCCINYLCHOLINE CHLORIDE 20 MG/ML IJ SOLN
INTRAMUSCULAR | Status: DC | PRN
  Administered 2015-03-09: 30 mg via INTRAVENOUS
  Administered 2015-03-09: 130 mg via INTRAVENOUS

## 2015-03-09 MED ORDER — ACETAMINOPHEN 325 MG PO TABS: 650.0000 mg | ORAL_TABLET | ORAL | Status: DC | PRN

## 2015-03-09 MED ORDER — BUPIVACAINE-EPINEPHRINE 0.5% -1:200000 IJ SOLN
INTRAMUSCULAR | Status: DC | PRN
  Administered 2015-03-09 (×2): 1.8 mL

## 2015-03-09 MED ORDER — METOPROLOL TARTRATE 50 MG PO TABS
50.0000 mg | ORAL_TABLET | Freq: Two times a day (BID) | ORAL | Status: DC
  Administered 2015-03-09: 50 mg via ORAL
  Filled 2015-03-09 (×4): qty 1

## 2015-03-09 MED ORDER — MIDAZOLAM HCL 2 MG/2ML IJ SOLN
INTRAMUSCULAR | Status: AC
  Filled 2015-03-09: qty 4

## 2015-03-09 SURGICAL SUPPLY — 35 items
ALCOHOL 70% 16 OZ (MISCELLANEOUS) ×3 IMPLANT
ATTRACTOMAT 16X20 MAGNETIC DRP (DRAPES) ×3 IMPLANT
BLADE SURG 15 STRL LF DISP TIS (BLADE) ×2 IMPLANT
BLADE SURG 15 STRL SS (BLADE) ×6
COVER SURGICAL LIGHT HANDLE (MISCELLANEOUS) ×3 IMPLANT
GAUZE PACKING FOLDED 2  STR (GAUZE/BANDAGES/DRESSINGS) ×2
GAUZE PACKING FOLDED 2 STR (GAUZE/BANDAGES/DRESSINGS) ×1 IMPLANT
GAUZE SPONGE 4X4 16PLY XRAY LF (GAUZE/BANDAGES/DRESSINGS) ×5 IMPLANT
GLOVE BIOGEL PI IND STRL 6 (GLOVE) ×1 IMPLANT
GLOVE BIOGEL PI INDICATOR 6 (GLOVE) ×2
GLOVE SURG ORTHO 8.0 STRL STRW (GLOVE) ×3 IMPLANT
GLOVE SURG SS PI 6.0 STRL IVOR (GLOVE) ×3 IMPLANT
GOWN STRL REUS W/ TWL LRG LVL3 (GOWN DISPOSABLE) ×1 IMPLANT
GOWN STRL REUS W/TWL 2XL LVL3 (GOWN DISPOSABLE) ×3 IMPLANT
GOWN STRL REUS W/TWL LRG LVL3 (GOWN DISPOSABLE) ×3
HEMOSTAT SURGICEL 2X14 (HEMOSTASIS) ×3 IMPLANT
KIT BASIN OR (CUSTOM PROCEDURE TRAY) ×3 IMPLANT
KIT ROOM TURNOVER OR (KITS) ×3 IMPLANT
MANIFOLD NEPTUNE WASTE (CANNULA) ×3 IMPLANT
NDL BLUNT 16X1.5 OR ONLY (NEEDLE) ×1 IMPLANT
NEEDLE BLUNT 16X1.5 OR ONLY (NEEDLE) ×3 IMPLANT
NS IRRIG 1000ML POUR BTL (IV SOLUTION) ×3 IMPLANT
PACK EENT II TURBAN DRAPE (CUSTOM PROCEDURE TRAY) ×3 IMPLANT
PAD ARMBOARD 7.5X6 YLW CONV (MISCELLANEOUS) ×3 IMPLANT
SPONGE SURGIFOAM ABS GEL 100 (HEMOSTASIS) IMPLANT
SPONGE SURGIFOAM ABS GEL 12-7 (HEMOSTASIS) IMPLANT
SPONGE SURGIFOAM ABS GEL SZ50 (HEMOSTASIS) IMPLANT
SUCTION FRAZIER TIP 10 FR DISP (SUCTIONS) ×3 IMPLANT
SUT CHROMIC 3 0 PS 2 (SUTURE) ×6 IMPLANT
SUT CHROMIC 4 0 P 3 18 (SUTURE) ×2 IMPLANT
SYR 50ML SLIP (SYRINGE) ×3 IMPLANT
TOWEL OR 17X26 10 PK STRL BLUE (TOWEL DISPOSABLE) ×3 IMPLANT
TUBE CONNECTING 12'X1/4 (SUCTIONS) ×1
TUBE CONNECTING 12X1/4 (SUCTIONS) ×2 IMPLANT
YANKAUER SUCT BULB TIP NO VENT (SUCTIONS) ×3 IMPLANT

## 2015-03-09 NOTE — Progress Notes (Signed)
Pt has order to wear CPAP tonight. Pt brought home machine with FFM. Pt had procedure today to extract multiple teeth. Pt is in pain and requesting 1L O2 while sleeping tonight. RN/RT will monitor.

## 2015-03-09 NOTE — Progress Notes (Signed)
PRE-OPERATIVE NOTE:  03/09/2015   Donald [name] [id number]  VITALS: BP 160/102 mmHg  Pulse 72  Temp(Src) 98.2 F (36.8 C) (Oral)  Resp 18  Ht  (1.803 m)  Wt 211 lb (95.709 kg)  BMI 29.44 kg/m2  SpO2 97%  Lab Results  Component Value Date   WBC 6.5 03/03/2015   HGB 14.4 03/03/2015   HCT 43.5 03/03/2015   MCV 88.8 03/03/2015   PLT 179 03/03/2015   BMET    Component Value Date/Time   NA 138 03/02/2015 0938   K 3.7 03/02/2015 0938   CL 105 03/02/2015 0938   CO2 27 03/02/2015 0938   GLUCOSE 202* 03/02/2015 0938   BUN 14 03/02/2015 0938   CREATININE 1.16 03/02/2015 0938   CALCIUM 8.8* 03/02/2015 0938   GFRNONAA >60 03/02/2015 0938   GFRAA >60 03/02/2015 0938    Lab Results  Component Value Date   INR 1.06 02/27/2015   INR 1.78* 02/02/2015   INR 1.56* 08/03/2012   No results found for: PTT   Donald [name] presents for multiple dental extractions with alveoloplasty, pre-prosthetic surgery as stated and gross debridement of remaining dentition the operating room with general anesthesia.    SUBJECTIVE: The patient denies any acute medical or dental changes and agrees to proceed with treatment as planned.  EXAM: No sign of acute dental changes.  ASSESSMENT: Patient is affected by chronic periodontitis, dental caries, tooth mobility, and accretions.  PLAN: Patient agrees to proceed with treatment as planned in the operating room as previously discussed and accepts the risks, benefits, and complications of the proposed treatment. Patient is aware of the risk for bleeding, bruising, swelling, infection, pain, nerve damage, soft tissue damage, damage to adjacent teeth, sinus involvement, root tip fracture, mandible fracture, and the risks of complications associated with the anesthesia. Patient also is aware of the potential for complications up to and including death due to overall cardiovascular compromise. Patient also is aware of the potential for other  complications not mentioned above.   Charlynne Pander, DDS

## 2015-03-09 NOTE — Progress Notes (Signed)
Per Dr Kristin Bruins, restart Xarelto on 09/10 if no sig bleeding issues.  Donald [name], Cordelia Poche 03/09/2015 10:28 AM Beeper [phone]

## 2015-03-09 NOTE — H&P (Signed)
03/09/2015  BP 160/102 mmHg  Pulse 72  Temp(Src) 98.2 F (36.8 C) (Oral)  Resp 18  Ht  (1.803 m)  Wt 211 lb (95.709 kg)  BMI 29.44 kg/m2  SpO2 97%  Patient:            Donald [name] Date of Birth:  1961-05-31 MRN:                [id number]   Donald [name] is a 54 year old male that presents for multiple dental extractions with alveoloplasty, pre-prosthetic surgery as needed, and gross debridement of remaining dentition the operating room with general anesthesia. Patient denies acute medical or dental changes. Please use no Dr. Gwenlyn Perking as the H&P for the dental operating room procedure.   Charlynne Pander, DDS   Author: Vassie Loll, MD Service: Internal Medicine Author Type: Physician    Filed: 02/27/2015 2:28 PM Note Time: 02/27/2015 2:04 PM Status: Signed   Editor: Vassie Loll, MD (Physician)     Expand All Collapse All    [facility] History and Physical  Donald [name]:[id number] DOB: 1960/11/06 DOA: 02/27/2015  Referring physician: Dr. Gwendolyn Grant PCP: Pcp Not In System   Chief Complaint: SOB, slurred speech, Left arm numbness   HPI: Donald [name] is a 54 y.o. male with PMH significant for A. Fib, HTN, arthritis, substance abuse (cocaine, alcohol), COPD, tobacco abuse and severe aortic stenosis. Who presented to ED with complaints of slurred speech and left arm numbness. Patient reports symptoms started last night while he was talking with AA sponsor. Some slurred speech, foggy thoughts and left arm numbness. Patient symptoms improved/resolved by the time he reached ED, except for his left arm numbness. No CP, no fever, no chills, no abd pain, no nausea, vomiting or diaphoresis. Patient also endorses some SOB and increase productive cough. In ED work up positive for elevated troponin up to 0.23 and wheezing. CXR w/o acute infiltrates. [facility] called to admit patient for TIA work up, COPD exacerbation and elevated troponin.     Review of Systems:  Negative  except as mentioned on HPI.  Past Medical History  Diagnosis Date  . Substance abuse   . A-fib   . Hypertension   . Arthritis   . COPD (chronic obstructive pulmonary disease)    Past Surgical History  Procedure Laterality Date  . Tonsillectomy     Social History:  reports that he has been smoking Cigarettes. He has a 88 pack-year smoking history. He does not have any smokeless tobacco history on file. He reports that he drinks alcohol. He reports that he uses illicit drugs (Cocaine) about 7 times per week.  No Known Allergies  Family History  Problem Relation Age of Onset  . Family history unknown: discussed with patient and he is not aware of any significant PMH on his family   Prior to Admission medications   Medication Sig Start Date End Date Taking? Authorizing Provider  albuterol (PROVENTIL) (2.5 MG/3ML) 0.083% nebulizer solution Take 2.5 mg by nebulization every 6 (six) hours as needed for wheezing or shortness of breath.  02/01/15  Yes Historical Provider, MD  amiodarone (PACERONE) 200 MG tablet Take 1 tablet (200 mg total) by mouth daily. For Atrial fibrillation/Hypertrophic Cardiomyopathy 08/03/12  Yes Sanjuana Kava, NP  budesonide-formoterol [facility]) 160-4.5 MCG/ACT inhaler Inhale 2 puffs into the lungs 2 (two) times daily.   Yes Historical Provider, MD  COMBIVENT RESPIMAT 20-100 MCG/ACT AERS respimat Take 1 puff by mouth every 6 (  six) hours as needed for wheezing or shortness of breath.  02/01/15  Yes Historical Provider, MD  digoxin (LANOXIN) 0.25 MG tablet Take 0.25 mg by mouth daily.   Yes Historical Provider, MD  XARELTO 20 MG TABS tablet Take 20 mg by mouth daily with supper.  02/01/15  Yes Historical Provider, MD   Physical Exam: Filed Vitals:   02/27/15 1130 02/27/15 1215 02/27/15 1315 02/27/15 1345  BP: 135/91 125/95 154/95 157/94  Pulse: 73 70 61 67  Temp:        TempSrc:      Resp: 21 16 20 21   SpO2: 98% 97% 97% 97%    Wt Readings from Last 3 Encounters:  02/03/15 90.6 kg (199 lb 11.8 oz)  12/09/13 102.059 kg (225 lb)  07/30/12 88.905 kg (196 lb)     General: Appears calm, in no distress and cooperative to examination. No slurred speech and able to follow commands. Complaining mainly of SOB and dullness/tingling sensation on his left arm  Eyes: PERRL, normal lids, irises & conjunctiva, no icterus, no nystagmus   ENT: grossly normal hearing, lips & tongue, no erythema or exudates  Neck: no LAD, masses or thyromegaly, no JVD  Cardiovascular: Regular rate, S1 and S2, no rubs, no gallops, positive SEM  Respiratory: CTA bilaterally, no w/r/r. Normal respiratory effort.  Abdomen: soft, nt, nd, positive BS  Skin: no rash or induration seen on exam  Musculoskeletal: grossly normal tone BUE/BLE, no joint swelling  Psychiatric: grossly normal mood and affect, speech fluent and appropriate  Neurologic: reports left side upper extremity weakness, CN intact, uvula and tongue midline; normal finger to nose and no dysarthria on exam. Gait not evaluated           Labs on Admission:  Basic Metabolic Panel:  Last Labs      Recent Labs Lab 02/27/15 1044  NA 140  K 4.3  CL 106  CO2 29  GLUCOSE 106*  BUN 17  CREATININE 1.27*  CALCIUM 9.4     Liver Function Tests:  Last Labs      Recent Labs Lab 02/27/15 1044  AST 16  ALT 15*  ALKPHOS 84  BILITOT 0.9  PROT 6.7  ALBUMIN 3.7     CBC:  Last Labs      Recent Labs Lab 02/27/15 1044  WBC 7.7  NEUTROABS 4.7  HGB 16.4  HCT 49.0  MCV 88.6  PLT 188     Cardiac Enzymes:  Last Labs      Recent Labs Lab 02/27/15 1044  TROPONINI 0.23*      BNP (last 3 results)  Recent Labs (within last 365 days)     Recent Labs  02/02/15 1758  BNP 112.0*     CBG:  Last Labs     No  results for input(s): GLUCAP in the last 168 hours.    Radiological Exams on Admission:  Imaging Results (Last 48 hours)    Dg Chest 2 View  02/27/2015 CLINICAL DATA: Left upper extremity region pain. Atrial fibrillation. Hypertension. EXAM: CHEST 2 VIEW COMPARISON: Chest radiograph and chest CT February 02, 2015 FINDINGS: There is a small nipple shadow on the right. There is no edema or consolidation. The heart size and pulmonary vascularity normal. No adenopathy. No pneumothorax. There old rib fractures on the left, stable. IMPRESSION: Evidence of old rib trauma on the left. No edema or consolidation. No pneumothorax. Electronically Signed By: Bretta Bang III M.D. On: 02/27/2015 12:20   Ct Head  Wo Contrast  02/27/2015 CLINICAL DATA: Slurred speech. Left arm pain. EXAM: CT HEAD WITHOUT CONTRAST TECHNIQUE: Contiguous axial images were obtained from the base of the skull through the vertex without intravenous contrast. COMPARISON: 02/02/2015, 10/19/2013 FINDINGS: There is no evidence of mass effect, midline shift or extra-axial fluid collections. There is no evidence of a space-occupying lesion or intracranial hemorrhage. There is no evidence of a cortical-based area of acute infarction. The ventricles and sulci are appropriate for the patient's age. The basal cisterns are patent. Visualized portions of the orbits are unremarkable. The visualized portions of the paranasal sinuses and mastoid air cells are unremarkable. The osseous structures are unremarkable. IMPRESSION: Normal CT of the brain without intravenous contrast. Electronically Signed By: Elige Ko On: 02/27/2015 12:08     EKG: No acute ischemic changes seen on EKG. Regular rate and Sinus rhythm   Assessment/Plan 1-slurred speech and left arm numbness: most likely TIA (transient ischemic attack), but will admit to r/o stroke. Patient has not been compliant with his xarelto in the last couple of days  PTA. -will admit to telemetry  -will check MRI, carotid duplex, A1C and lipid profile -patient recently had 2-D echo during this month, so will no repeat -will continue xarelto and follow further rec's from neurology -patient is symptoms free on exam and has passed swallowing test  -TIA protocol implemented  -will check B12  2-SOB (shortness of breath): due to COPD exacerbation -will use PRN oxygen supplementation -will start treatment with dual nebs and pulmicort, PO prednisone and doxycycline -will follow clinical response -patient advise to quit smoking  3-Elevated troponin: no CP. Most likely secondary to COPD exacerbation; but with risk factors and heart score of 4 -will cycle troponin and follow EKG in am -cardiology has been consulted and will follow rec's  4-Chronic atrial fibrillation: rate controlled -will continue amiodarone, digoxin and xarelto  5-COPD exacerbation: as mentioned above, most likely due to bronchiectasis and continue smoking -will use nebulizer treatment, doxycycline, prednisone, mucinex and IS -PRN oxygen -will follow clinical response  6-Tobacco abuse and cocaine abuse: will use nicotine patch -cessation counseling provided -patient has been clean for 3 weeks according to him   7-Chronic arthritis: with slight flare on his right hip -will improved with steroids initiated for his breathing -will use PRN pain meds  8-Benign essential HTN: BP stable. Will monitor VS. He was not taking any meds at home..  9-chronic AS: follow up with outpatient cardiology.   Cardiology and neurology service (consulted by ED physician)  Code Status: Full DVT Prophylaxis:Xarelto (which would be resumed) Family Communication: no family at bedside Disposition Plan: LOS < 2 midnights, observation, telemetry   Time spent: 55 minutes  Vassie Loll [facility] Pager [phone]

## 2015-03-09 NOTE — Anesthesia Postprocedure Evaluation (Signed)
  Anesthesia Post-op Note  Patient: Donald [name]  Procedure(s) Performed: Procedure(s): Extraction of tooth #'s 2,5,8,9,10,16 with alveoloplasty and gross debridement of remaining dentition (N/A)  Patient Location: PACU  Anesthesia Type:General  Level of Consciousness: awake and alert   Airway and Oxygen Therapy: Patient Spontanous Breathing  Post-op Pain: none  Post-op Assessment: Post-op Vital signs reviewed              Post-op Vital Signs: Reviewed  Last Vitals:  Filed Vitals:   03/09/15 1102  BP:   Pulse:   Temp: 36.8 C  Resp:     Complications: No apparent anesthesia complications

## 2015-03-09 NOTE — Anesthesia Procedure Notes (Signed)
Procedure Name: Intubation Date/Time: 03/09/2015 7:44 AM Performed by: Lovie Chol Pre-anesthesia Checklist: Patient identified, Emergency Drugs available, Suction available, Patient being monitored and Timeout performed Patient Re-evaluated:Patient Re-evaluated prior to inductionOxygen Delivery Method: Circle system utilized Preoxygenation: Pre-oxygenation with 100% oxygen Intubation Type: IV induction Ventilation: Mask ventilation without difficulty Grade View: Grade I Tube type: Oral Tube size: 7.5 mm Number of attempts: 1 Airway Equipment and Method: Stylet and Video-laryngoscopy Placement Confirmation: positive ETCO2,  CO2 detector,  breath sounds checked- equal and bilateral and ETT inserted through vocal cords under direct vision Secured at: 22 cm Tube secured with: Tape Dental Injury: Teeth and Oropharynx as per pre-operative assessment

## 2015-03-09 NOTE — Transfer of Care (Signed)
Immediate Anesthesia Transfer of Care Note  Patient: Donald [name]  Procedure(s) Performed: Procedure(s): Extraction of tooth #'s 2,5,8,9,10,16 with alveoloplasty and gross debridement of remaining dentition (N/A)  Patient Location: PACU  Anesthesia Type:General  Level of Consciousness: awake and oriented  Airway & Oxygen Therapy: Patient Spontanous Breathing and Patient connected to face mask oxygen  Post-op Assessment: Report given to RN, Post -op Vital signs reviewed and stable and Patient moving all extremities  Post vital signs: Reviewed and stable  Last Vitals:  Filed Vitals:   03/09/15 0624  BP: 160/102  Pulse: 72  Temp: 36.8 C  Resp: 18    Complications: No apparent anesthesia complications
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
## 2015-03-09 NOTE — Op Note (Signed)
OPERATIVE REPORT  Patient:            Donald [name] Date of Birth:  05/05/1961 MRN:                [id number]   DATE OF PROCEDURE:  03/09/2015  PREOPERATIVE DIAGNOSES: 1.  Severe aortic stenosis 2. Chronic anticoagulation 3. Chronic apical periodontitis 4. Multiple dental caries 5. Chronic periodontitis 6. Accretions   POSTOPERATIVE DIAGNOSES: 1.  Severe aortic stenosis 2. Chronic anticoagulation 3. Chronic apical periodontitis 4. Multiple dental caries 5. Chronic periodontitis 6. Accretions   OPERATIONS: 1. Multiple extraction of tooth numbers 2, 5, 8, 9, 10, 16  2. 2 Quadrants of alveoloplasty 3. Gross debridement of remaining dentition   SURGEON: Charlynne Pander, DDS  ASSISTANT: Rory Percy, (dental assistant)  ANESTHESIA: General anesthesia via oral endotracheal tube.  MEDICATIONS: 1. Ancef 2 g IV prior to invasive dental procedures. 2. Local anesthesia with a total utilization of 5 carpules each containing 34 mg of lidocaine with 0.017 mg of epinephrine as well as 1 carpules each containing 9 mg of bupivacaine with 0.009 mg of epinephrine.  SPECIMENS: There are 6 teeth that were discarded.  DRAINS: None  CULTURES: None  COMPLICATIONS: None   ESTIMATED BLOOD LOSS: 100 mLs.  INTRAVENOUS FLUIDS: Lactated ringers solution per the anesthesia team  INDICATIONS: The patient was recently diagnosed with severe aortic stenosis.  A dental consultation was then requested to to evaluate poor dentition as part of a medically necessary pre-heart valve surgery dental protocol.  The patient was examined and treatment planned for multiple extractions with alveoloplasty and gross debridement of remaining dentition in the operating room with general anesthesia.  This treatment plan was formulated to decrease the risks and complications associated with dental infection from affecting the patient's systemic health and the anticipated heart valve surgery.  OPERATIVE  FINDINGS: Patient was examined in operating room number 8.  The teeth were identified for extraction.  Tooth numbers 2, 5, 8, 9, 10, and 16 were then identified for extraction. Patient does have additional caries involving the mandibular teeth that should be able to be restored by the primary dentist of his choice after the heart valve surgery and once medically stable. The patient was noted be affected by a history of acute pulpitis, chronic apical periodontitis, multiple dental caries, chronic periodontitis, and accretions.   DESCRIPTION OF PROCEDURE: Patient was brought to the main operating room number 8. Patient was then placed in the supine position on the operating table. General anesthesia was then induced per the anesthesia team. The patient was then prepped and draped in the usual manner for dental medicine procedure. A timeout was performed. The patient was identified and procedures were verified. A throat pack was placed at this time. The oral cavity was then thoroughly examined with the findings noted above. The patient was then ready for dental medicine procedure as follows:  Local anesthesia was then administered sequentially with a total utilization of 5 carpules each containing 34 mg of lidocaine with 0.017 mg of epinephrine as well as 1 carpules  each containing 9 mg bupivacaine with 0.009 mg of epinephrine.  The Maxillary left and right quadrants first approached. Anesthesia was then delivered utilizing infiltration with lidocaine with epinephrine. A #15 blade incision was then made from the maxillary right tuberosity and extended to the distal of #11.  A second 15 blade incision was then made from the maxillary left tuberosity and extended to the mesial of #13.  Surgical  flaps were then carefully reflected. Appropriate amounts of buccal and interseptal bone were then removed  around tooth numbers 2, 5, 8, 9, 10, and 16 utilizing a surgical handpiece and bur and copious amounts of sterile  water.  The teeth were then subluxated with a series of straight elevators. Tooth numbers 8, 9, and 10 were then removed with a 150 forceps without complications. The coronal aspect of tooth numbers 2, 5, and 16 were then removed with a 150 forceps  leaving the roots remaining. Further bone was then removed around the retained roots utilizing a surgical handpiece and bur and copious amounts sterile water. The retained roots were then elevated out with a series of cryers elevators without complication. Alveoloplasty was then performed utilizing a ronguers and bone file. The surgical site was then irrigated with copious amounts of sterile saline. The tissues were approximated and trimmed appropriately.  A piece of Surgicel was then placed in the extraction sockets appropriately.  The  maxillary right surgical site was then closed from the maxillary right tuberosity and extended to the mesial of #8 utilizing 3-0 chromic gut suture in a continuous interrupted suture technique 1.  The maxillary left surgical site was then closed from the maxillary left tuberosity and extended to the mesial of #13 utilizing 3-0 chromic gut suture in a continuous interrupted suture technique 1. The maxillary left surgical site was then further closed from the distal of #11 and extended to the mesial of #9 utilizing 3-0 chromic gut suture in a continuous interrupted suture technique 1. 3 individual interrupted sutures then placed to further close the surgical site as needed. A series of three 4-0 chromic gut interrupted sutures then placed in the area of #15-16 to further close the surgical site as needed.  At this point time the mandibular teeth were approached. The patient was examined for nonrestorable teeth. Dental caries were noted and these teeth were felt to be able to be restored in the future by the primary dentist of his choice once he is medically stable.  At this point time a gross debridement procedure was performed  utilizing a KaVo sonic scaler. A series of hand curettes were then utilized to further remove accretions. A sonic scaler was then again used to further refine removal of accretions.  At this point time, the entire mouth was irrigated with copious amounts of sterile saline. The patient was examined for complications, seeing none, the dental medicine procedure was deemed to be complete. The throat pack was removed at this time. An oral airway was then placed at the request of the anesthesia team. A series of 4 x 4 gauze  moistened with Amicar 5% oral rinse were placed in the mouth to aid hemostasis. The patient was then handed over to the anesthesia team for final disposition. After an appropriate amount of time, the patient was extubated and taken to the postanesthsia care unit in good condition. All counts were correct for the dental medicine procedure.The patient will be admitted to Cardiology for overnight observation and will be discharged in the morning as indicated. The patient is to continue Amicar 5% oral rinses. Patient is to rinse with 10 mLs every hour for the next 10 hours at then as needed for persistent oozing from dental extraction sites. The Xarelto is to be discontinued for an additional 48 hours and then restarted if NO significant oral bleeding is present on Saturday evening at 6 PM. The patient is to proceed with evaluation for aortic valve replacement  with Dr. Laneta Simmers as indicated. Patient may be provided a prescription for Percocet 5/325 taking 1-2 tablets every 4-6 hours as needed for pain at discharge. Patient to contact dental medicine for evaluation for suture removal in 7-10 days.   Charlynne Pander, DDS.

## 2015-03-09 NOTE — H&P (Signed)
Patient ID: HIEU HERMS MRN: [id number], DOB/AGE: [birth year]   Admit date: 03/09/2015   Primary Physician: Pcp Not In System Primary Cardiologist: Delton See  Pt. Profile:  This 54 year old gentleman who underwent extensive dental work today in preparation for aortic valve replacement for severe aortic stenosis.  He is being admitted overnight for observation.  Problem List  Past Medical History  Diagnosis Date  . Substance abuse   . A-fib   . Hypertension   . Arthritis   . COPD (chronic obstructive pulmonary disease)   . Dysrhythmia     Afib  . Shortness of breath dyspnea     with exertion  . GERD (gastroesophageal reflux disease)   . Constipation   . PE (pulmonary embolism)     11/24/14 right PE (HPR)  . Stroke 02/27/15     TIA 02/27/15; no residual  . Sleep apnea     Past Surgical History  Procedure Laterality Date  . Tonsillectomy    . Cardiac catheterization N/A 02/28/2015    Procedure: Right/Left Heart Cath and Coronary Angiography;  Surgeon: Lyn Records, MD;  Location: [facility] INVASIVE CV LAB;  Service: Cardiovascular;  Laterality: N/A;  . Finger surgery Left     Index finger , tried to replant, unsuccessful  . Hand surgery Left     fracture     Allergies  No Known Allergies  HPI This 54 year old gentleman was recently admitted on 02/27/15 for further evaluation of his severe aortic stenosis.  He had an echocardiogram on 02/03/15 which showed a mean gradient of 52 and a peak gradient of 81 mmHg across his aortic valve.  He has a past history of pulmonary embolism polysubstance abuse COPD and paroxysmal atrial fibrillation.  He has a history of hypertension.  He has been on long-term Xarelto.  On his last admission he was in normal sinus rhythm.  He remains in normal sinus rhythm today. The patient underwent cardiac catheterization on 02/28/15 by Dr. Verdis Prime which showed normal coronary arteries and normal left ventricular systolic function and normal pulmonary  artery pressure.  There was severe aortic stenosis with peak gradient of 57 and mean gradient of 47 and calculated aortic valve area of 0.74 Centimeters squared. The patient underwent multiple dental extraction with alveoloplasty and gross debridement today by Dr. Cindra Eves.  The patient is doing well postoperatively.  He denies any chest pain or shortness of breath.  He remains in normal sinus rhythm.  Home Medications  Prior to Admission medications   Medication Sig Start Date End Date Taking? Authorizing Provider  albuterol (PROVENTIL) (2.5 MG/3ML) 0.083% nebulizer solution Take 2.5 mg by nebulization every 6 (six) hours as needed for wheezing or shortness of breath.  02/01/15  Yes Historical Provider, MD  amiodarone (PACERONE) 200 MG tablet Take 1 tablet (200 mg total) by mouth daily. For Atrial fibrillation/Hypertrophic Cardiomyopathy 03/03/15  Yes Marinda Elk, MD  amoxicillin-clavulanate (AUGMENTIN) 875-125 MG per tablet Take 1 tablet by mouth every 12 (twelve) hours. 03/03/15  Yes Marinda Elk, MD  atorvastatin (LIPITOR) 40 MG tablet Take 1 tablet (40 mg total) by mouth daily. 03/03/15  Yes Marinda Elk, MD  budesonide-formoterol [facility]) 160-4.5 MCG/ACT inhaler Inhale 2 puffs into the lungs 2 (two) times daily.   Yes Historical Provider, MD  COMBIVENT RESPIMAT 20-100 MCG/ACT AERS respimat Take 1 puff by mouth every 6 (six) hours as needed for wheezing or shortness of breath.  02/01/15  Yes Historical Provider, MD  metoprolol (  LOPRESSOR) 50 MG tablet Take 50 mg by mouth 2 (two) times daily.   Yes Historical Provider, MD  XARELTO 20 MG TABS tablet Take 1 tablet (20 mg total) by mouth daily with supper. 03/03/15  Yes Marinda Elk, MD  nicotine (NICODERM CQ - DOSED IN MG/24 HOURS) 14 mg/24hr patch Place 1 patch (14 mg total) onto the skin daily. Patient not taking: Reported on 03/07/2015 03/03/15   Marinda Elk, MD    Family History  Family History  Problem  Relation Age of Onset  . Family history unknown: Yes    Social History  Social History   Social History  . Marital Status: Single    Spouse Name: N/A  . Number of Children: N/A  . Years of Education: N/A   Occupational History  . Not on file.   Social History Main Topics  . Smoking status: Current Some Day Smoker -- 1.00 packs/day for 44 years    Types: Cigarettes  . Smokeless tobacco: Not on file  . Alcohol Use: No  . Drug Use: 7.00 per week    Special: Cocaine     Comment: been 1 month and 3 days ago  . Sexual Activity: Not on file     Comment: crack cocaine this morning at 0300   Other Topics Concern  . Not on file   Social History Narrative     Review of Systems General:  No chills, fever, night sweats or weight changes.  Cardiovascular:  No chest pain, dyspnea on exertion, edema, orthopnea, palpitations, paroxysmal nocturnal dyspnea. Dermatological: No rash, lesions/masses Respiratory: No cough, dyspnea Urologic: No hematuria, dysuria Abdominal:   No nausea, vomiting, diarrhea, bright red blood per rectum, melena, or hematemesis Neurologic:  No visual changes, wkns, changes in mental status. All other systems reviewed and are otherwise negative except as noted above.  Physical Exam  Blood pressure 160/102, pulse 72, temperature 97.2 F (36.2 C), temperature source Oral, resp. rate 18, height 5\' 11"  (1.803 m), weight 211 lb (95.709 kg), SpO2 97 %.  General: Pleasant, NAD.  Sitting up, alert.  No active mouth bleeding at this time Psych: Normal affect. Neuro: Alert and oriented X 3. Moves all extremities spontaneously. HEENT: Normal  Neck: Supple without bruits or JVD. Lungs:  Resp regular and unlabored, there is mild expiratory wheezing bilaterally.  The patient is in no distress.Marland Kitchen Heart: Regular rhythm.  No gallop.  There is a grade 2/6 systolic ejection murmur loudest at the base.  The patient was examined in the sitting position. Abdomen: Soft,  non-tender, non-distended, BS + x 4.  Extremities: No clubbing, cyanosis or edema. DP/PT/Radials 2+ and equal bilaterally.  Labs  Troponin (Point of Care Test) No results for input(s): TROPIPOC in the last 72 hours. No results for input(s): CKTOTAL, CKMB, TROPONINI in the last 72 hours. Lab Results  Component Value Date   WBC 6.5 03/03/2015   HGB 14.4 03/03/2015   HCT 43.5 03/03/2015   MCV 88.8 03/03/2015   PLT 179 03/03/2015     Recent Labs Lab 03/09/15 0654  NA 137  K 5.0  CL 103  CO2 25  BUN 26*  CREATININE 1.14  CALCIUM 8.7*  PROT 6.6  BILITOT 0.5  ALKPHOS 78  ALT 32  AST 17  GLUCOSE 135*   Lab Results  Component Value Date   CHOL 208* 02/28/2015   HDL 44 02/28/2015   LDLCALC 144* 02/28/2015   TRIG 98 02/28/2015   No results found for:  DDIMER   Radiology/Studies  Dg Orthopantogram  03/01/2015   CLINICAL DATA:  Acute onset of mandibular pain. Assess dental caries. Initial encounter.  EXAM: ORTHOPANTOGRAM/PANORAMIC  COMPARISON:  None.  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 Vague lucencies with regard to multiple maxillary and mandibular teeth, particularly the right central and lateral mandibular incisors, the remaining right maxillary molar, the left second mandibular premolar, and the remaining central maxillary teeth, may reflect dental caries.  The visualized paranasal sinuses are well-aerated. There is no evidence of fracture or dislocation.  IMPRESSION: Vague lucencies with regard to multiple maxillary and mandibular teeth as described above. These may reflect dental caries, given clinical concern. This would be better assessed on dedicated dental radiographs.   Electronically Signed   By: Roanna Raider M.D.   On: 03/01/2015 22:54   Dg Chest 2 View  02/27/2015   CLINICAL DATA:  Left upper extremity region pain. Atrial fibrillation. Hypertension.  EXAM: CHEST  2 VIEW  COMPARISON:  Chest radiograph and chest CT February 02, 2015  FINDINGS: There is a small nipple shadow on the right. There is no edema or consolidation. The heart size and pulmonary vascularity normal. No adenopathy. No pneumothorax. There old rib fractures on the left, stable.  IMPRESSION: Evidence of old rib trauma on the left. No edema or consolidation. No pneumothorax.   Electronically Signed   By: Bretta Bang III M.D.   On: 02/27/2015 12:20   Ct Head Wo Contrast  02/27/2015   CLINICAL DATA:  Slurred speech.  Left arm pain.  EXAM: CT HEAD WITHOUT CONTRAST  TECHNIQUE: Contiguous axial images were obtained from the base of the skull through the vertex without intravenous contrast.  COMPARISON:  02/02/2015, 10/19/2013  FINDINGS: There is no evidence of mass effect, midline shift or extra-axial fluid collections. There is no evidence of a space-occupying lesion or intracranial hemorrhage. There is no evidence of a cortical-based area of acute infarction.  The ventricles and sulci are appropriate for the patient's age. The basal cisterns are patent.  Visualized portions of the orbits are unremarkable. The visualized portions of the paranasal sinuses and mastoid air cells are unremarkable.  The osseous structures are unremarkable.  IMPRESSION: Normal CT of the brain without intravenous contrast.   Electronically Signed   By: Elige Ko   On: 02/27/2015 12:08   [name] Head Wo Contrast  02/27/2015   CLINICAL DATA:  54 year old hypertensive male with atrial fibrillation and history of substance abuse presenting with slurred speech and left arm numbness. Subsequent encounter.  EXAM: MRI HEAD WITHOUT CONTRAST  MRA HEAD WITHOUT CONTRAST  TECHNIQUE: Multiplanar, multiecho pulse sequences of the brain and surrounding structures were obtained without intravenous contrast. Angiographic images of the head were obtained using MRA technique without contrast.  COMPARISON:  02/27/2015 head CT.  No comparison brain MR.  FINDINGS: MRI HEAD FINDINGS  No acute infarct.  No  intracranial hemorrhage.  Tiny area of encephalomalacia posterior medial left parietal-occipital lobe.  No hydrocephalus.  No intracranial mass lesion noted on this unenhanced exam.  Degenerative changes right C1- occiput articulation with fluid extending away from the joint.  Partially empty non expanded sella.  Cervical medullary junction pineal region unremarkable. Orbital structures within normal limits.  MRA HEAD FINDINGS  Anterior circulation without medium or large size vessel significant stenosis or occlusion. Mild narrowing right middle cerebral artery bifurcation. Mild middle cerebral artery branch vessel irregularity bilaterally.  Codominant vertebral arteries without high-grade stenosis.  Mild irregularity basilar artery without high-grade stenosis.  Nonvisualized anterior inferior cerebellar arteries.  Mild superior cerebellar arteries.  Mild posterior cerebral artery proximal and distal branch vessel irregularity and narrowing.  IMPRESSION: MRI HEAD FINDINGS  No acute infarct.  Tiny area of encephalomalacia posterior medial left parietal-occipital lobe.  Degenerative changes right C1- occiput articulation with fluid extending away from the joint.  MRA HEAD FINDINGS  Mild intracranial atherosclerotic type changes without medium or large size vessel significant stenosis occlusion.   Electronically Signed   By: Lacy Duverney M.D.   On: 02/27/2015 21:16   Mr Brain Wo Contrast  02/27/2015   CLINICAL DATA:  54 year old hypertensive male with atrial fibrillation and history of substance abuse presenting with slurred speech and left arm numbness. Subsequent encounter.  EXAM: MRI HEAD WITHOUT CONTRAST  MRA HEAD WITHOUT CONTRAST  TECHNIQUE: Multiplanar, multiecho pulse sequences of the brain and surrounding structures were obtained without intravenous contrast. Angiographic images of the head were obtained using MRA technique without contrast.  COMPARISON:  02/27/2015 head CT.  No comparison brain MR.   FINDINGS: MRI HEAD FINDINGS  No acute infarct.  No intracranial hemorrhage.  Tiny area of encephalomalacia posterior medial left parietal-occipital lobe.  No hydrocephalus.  No intracranial mass lesion noted on this unenhanced exam.  Degenerative changes right C1- occiput articulation with fluid extending away from the joint.  Partially empty non expanded sella.  Cervical medullary junction pineal region unremarkable. Orbital structures within normal limits.  MRA HEAD FINDINGS  Anterior circulation without medium or large size vessel significant stenosis or occlusion. Mild narrowing right middle cerebral artery bifurcation. Mild middle cerebral artery branch vessel irregularity bilaterally.  Codominant vertebral arteries without high-grade stenosis.  Mild irregularity basilar artery without high-grade stenosis.  Nonvisualized anterior inferior cerebellar arteries.  Mild superior cerebellar arteries.  Mild posterior cerebral artery proximal and distal branch vessel irregularity and narrowing.  IMPRESSION: MRI HEAD FINDINGS  No acute infarct.  Tiny area of encephalomalacia posterior medial left parietal-occipital lobe.  Degenerative changes right C1- occiput articulation with fluid extending away from the joint.  MRA HEAD FINDINGS  Mild intracranial atherosclerotic type changes without medium or large size vessel significant stenosis occlusion.   Electronically Signed   By: Lacy Duverney M.D.   On: 02/27/2015 21:16    ECG  27-Feb-2015 10:44:44 [facility]/ED ROUTINE RECORD Normal Sinus Rhythm Nonspecific intraventricular conduction delay Inferior infarct, old Probable anteroseptal infarct, old Personally reviewed  ASSESSMENT AND PLAN  1.  Severe aortic stenosis 2.  Postop multiple dental extractions and alveoplasty today 3.  History of paroxysmal atrial fibrillation, currently in normal sinus rhythm.  Chronic Xarelto 4.  Past history of polysubstance abuse 5.  Past history of  pulmonary embolus.  Chronic anticoagulation with Xarelto 6.  COPD  Plan: Admit to monitored bed for overnight observation.  Resume Xarelto when okay with Dr. Kristin Bruins.  Resume chronic cardiac medications.  Karie Schwalbe MD  03/09/2015, 10:02 AM

## 2015-03-10 ENCOUNTER — Telehealth: Payer: Self-pay

## 2015-03-10 ENCOUNTER — Encounter (HOSPITAL_COMMUNITY): Payer: Self-pay | Admitting: Dentistry

## 2015-03-10 DIAGNOSIS — K0530 Chronic periodontitis, unspecified: Secondary | ICD-10-CM

## 2015-03-10 DIAGNOSIS — I350 Nonrheumatic aortic (valve) stenosis: Secondary | ICD-10-CM | POA: Diagnosis not present

## 2015-03-10 DIAGNOSIS — K08109 Complete loss of teeth, unspecified cause, unspecified class: Secondary | ICD-10-CM

## 2015-03-10 DIAGNOSIS — K045 Chronic apical periodontitis: Secondary | ICD-10-CM | POA: Diagnosis not present

## 2015-03-10 LAB — BASIC METABOLIC PANEL
ANION GAP: 7 (ref 5–15)
BUN: 19 mg/dL (ref 6–20)
CALCIUM: 8.5 mg/dL — AB (ref 8.9–10.3)
CO2: 27 mmol/L (ref 22–32)
Chloride: 101 mmol/L (ref 101–111)
Creatinine, Ser: 0.98 mg/dL (ref 0.61–1.24)
Glucose, Bld: 112 mg/dL — ABNORMAL HIGH (ref 65–99)
POTASSIUM: 3.7 mmol/L (ref 3.5–5.1)
SODIUM: 135 mmol/L (ref 135–145)

## 2015-03-10 LAB — CBC
HCT: 41.2 % (ref 39.0–52.0)
HEMOGLOBIN: 13.6 g/dL (ref 13.0–17.0)
MCH: 29.8 pg (ref 26.0–34.0)
MCHC: 33 g/dL (ref 30.0–36.0)
MCV: 90.2 fL (ref 78.0–100.0)
Platelets: 161 10*3/uL (ref 150–400)
RBC: 4.57 MIL/uL (ref 4.22–5.81)
RDW: 14.7 % (ref 11.5–15.5)
WBC: 9.4 10*3/uL (ref 4.0–10.5)

## 2015-03-10 MED ORDER — OXYCODONE-ACETAMINOPHEN 5-325 MG PO TABS: 1.0000 | ORAL_TABLET | Freq: Four times a day (QID) | ORAL | Status: DC | PRN

## 2015-03-10 MED ORDER — AMINOCAPROIC ACID SOLUTION 5% (50 MG/ML): ORAL | Status: DC

## 2015-03-10 NOTE — Telephone Encounter (Signed)
Trans[facility]:  Follow-up phone call made to patient.  Patient recently hospitalized 02/27/15-03/03/15 for evaluation of severe aortic stenosis. S/p multiple dental extractions on 03/09/15. Reiterated Dr. Rollene Fare discharge instructions to resume Xarelto on 03/11/15 and that salt water rinses can be done as needed to aid healing. Patient verbalized understanding.  Discussed importance of medication compliance and patient verbalized understanding. Patient also reminded of follow-up appointment with Dr. Laneta Simmers on 03/22/15 at 1300 and to follow-up with Dr. Kristin Bruins in 7-10 days for suture removal. Patient verbalized understanding. In addition, patient reminded of [facility]itional [facility] appointment on 03/13/15 at 1115 with Dr. Venetia Night. Patient indicates he will be at appointment.

## 2015-03-10 NOTE — Discharge Instructions (Signed)
Resume Xarelto tomorrow (03/11/15). You will need to remove suture in 7-10, plase contact  Dr. Cindra Eves office or your PCP for suture removal.    MOUTH CARE AFTER SURGERY  FACTS:  Ice used in ice bag helps keep the swelling down, and can help lessen the pain.  It is easier to treat pain BEFORE it happens.  Spitting disturbs the clot and may cause bleeding to start again, or to get worse.  Smoking delays healing and can cause complications.  Sharing prescriptions can be dangerous.  Do not take medications not recently prescribed for you.  Antibiotics may stop birth control pills from working.  Use other means of birth control while on antibiotics.  Warm salt water rinses after the first 24 hours will help lessen the swelling:  Use 1/2 teaspoonful of table salt per oz.of water.  DO NOT:  Do not spit.  Do not drink through a straw.  Strongly advised not to smoke, dip snuff or chew tobacco at least for 3 days.  Do not eat sharp or crunchy foods.  Avoid the area of surgery when chewing.  Do not stop your antibiotics before your instructions say to do so.  Do not eat hot foods until bleeding has stopped.  If you need to, let your food cool down to room temperature.  EXPECT:  Some swelling, especially first 2-3 days.  Soreness or discomfort in varying degrees.  Follow your dentist's instructions about how to handle pain before it starts.  Pinkish saliva or light blood in saliva, or on your pillow in the morning.  This can last around 24 hours.  Bruising inside or outside the mouth.  This may not show up until 2-3 days after surgery.  Don't worry, it will go away in time.  Pieces of "bone" may work themselves loose.  It's OK.  If they bother you, let us know.  WHAT TO DO IMMEDIATELY AFTER SURGERY:  Bite on the gauze with steady pressure for 1-2 hours.  Don't chew on the gauze.  Do not lie down flat.  Raise your head support especially for the first 24 hours.  Apply  ice to your face on the side of the surgery.  You may apply it 20 minutes on and a few minutes off.  Ice for 8-12 hours.  You may use ice up to 24 hours.  Before the numbness wears off, take a pain pill as instructed.  Prescription pain medication is not always required.  SWELLING:  Expect swelling for the first couple of days.  It should get better after that.  If swelling increases 3 days or so after surgery; let us know as soon as possible.  FEVER:  Take Tylenol every 4 hours if needed to lower your temperature, especially if it is at 100F or higher.  Drink lots of fluids.  If the fever does not go away, let us know.  BREATHING TROUBLE:  Any unusual difficulty breathing means you have to have someone bring you to the emergency room ASAP  BLEEDING:  Light oozing is expected for 24 hours or so.  Prop head up with pillows  Avoid spitting  Do not confuse bright red fresh flowing blood with lots of saliva colored with a little bit of blood.  If you notice some bleeding, place gauze or a tea bag where it is bleeding and apply CONSTANT pressure by biting down for 1 hour.  Avoid talking during this time.  Do not remove the gauze or tea  bag during this hour to "check" the bleeding.  If you notice bright RED bleeding FLOWING out of particular area, and filling the floor of your mouth, put a wad of gauze on that area, bite down firmly and constantly.  Call us immediately.  If we're closed, have someone bring you to the emergency room.  ORAL HYGIENE:  Brush your teeth as usual after meals and before bedtime.  Use a soft toothbrush around the area of surgery.  DO NOT AVOID BRUSHING.  Otherwise bacteria(germs) will grow and may delay healing or encourage infection.  Since you cannot spit, just gently rinse and let the water flow out of your mouth.  DO NOT SWISH HARD.  EATING:  Cool liquids are a good point to start.  Increase to soft foods as  tolerated.  PRESCRIPTIONS:  Follow the directions for your prescriptions exactly as written.  If Dr. Kristin Bruins gave you a narcotic pain medication, do not drive, operate machinery or drink alcohol when on that medication.  QUESTIONS:  Call our office during office hours [phone] or call the Emergency Room at [phone].

## 2015-03-10 NOTE — Discharge Summary (Signed)
Discharge Summary   Patient ID: Donald [name],  MRN: [id number], DOB/AGE: 56/17/1962 56 y.o.  Admit date: 03/09/2015 Discharge date: 03/10/2015  Primary Care Provider: Pcp Not In System Primary Cardiologist: [facility]  Discharge Diagnoses Active Problems:   Aortic stenosis   Chronic periodontitis   Severe aortic valve stenosis   Allergies No Known Allergies  Procedures  Multiple dental extractions with alveoloplasty and full mouth debridement of remaining dentition in the operating room with general anesthesia on 03/09/2015.  History of Present Illness  This 54 year old gentleman was recently admitted on 02/27/15 for further evaluation of his severe aortic stenosis. He had an echocardiogram on 02/03/15 which showed a mean gradient of 52 and a peak gradient of 81 mmHg across his aortic valve. He has a past history of pulmonary embolism polysubstance abuse COPD and paroxysmal atrial fibrillation. He has a history of hypertension. He has been on long-term Xarelto. On his last admission he was in normal sinus rhythm. He remains in normal sinus rhythm today. The patient underwent cardiac catheterization on 02/28/15 by Dr. Verdis Prime which showed normal coronary arteries and normal left ventricular systolic function and normal pulmonary artery pressure. There was severe aortic stenosis with peak gradient of 57 and mean gradient of 47 and calculated aortic valve area of 0.74 Centimeters squared.  The patient underwent multiple dental extraction with alveoloplasty and gross debridement  03/09/15 by Dr. Cindra Eves. The patient was doing well postoperatively. He denied any chest pain or shortness of breath. He remained in normal sinus rhythm.  Hospital Course  He was admitted overnight for observation post multiple dental extraction. No chest pain or or. Sore mouth. He felt stable for discharge. Salt water rinses as needed to aid healing. He will resume his xarelto 03/11/15. Continued  amiodarone, metoprolol and statin.  He will follow up with Dr. Laneta Simmers as scheduled. He was advice to contact  Dr. Tawni Levy office or his PCP for suture removal.   Discharge Vitals Blood pressure 117/73, pulse 67, temperature 98.5 F (36.9 C), temperature source Oral, resp. rate 20, height  (1.803 m), weight 211 lb (95.709 kg), SpO2 96 %.  Filed Weights   03/09/15 0624  Weight: 211 lb (95.709 kg)    Labs  CBC  Recent Labs  03/10/15 0440  WBC 9.4  HGB 13.6  HCT 41.2  MCV 90.2  PLT 161   Basic Metabolic Panel  Recent Labs  03/09/15 0654 03/10/15 0440  NA 137 135  K 5.0 3.7  CL 103 101  CO2 25 27  GLUCOSE 135* 112*  BUN 26* 19  CREATININE 1.14 0.98  CALCIUM 8.7* 8.5*   Liver Function Tests  Recent Labs  03/09/15 0654  AST 17  ALT 32  ALKPHOS 78  BILITOT 0.5  PROT 6.6  ALBUMIN 3.7    Disposition  Pt is being discharged home today in good condition.  Follow-up Plans & Appointments  Follow-up Information    Call Charlynne Pander, DDS.   Specialty:  Dentistry   Why:  For suture removal in 7-10 days    Contact information:   [address] [place] [address] [phone]       Follow up with Alleen Borne, MD On 03/22/2015.   Specialty:  Cardiothoracic Surgery   Why:  :00   Contact information:   [address] [place] [address] [phone]           Discharge Instructions    Consult  to dietitian    Complete by:  As directed   Patient is edentulous.     Diet - low sodium heart healthy    Complete by:  As directed      Gauze    Complete by:  As directed   4 x 4 gauze to oral bleeding sites until oozing stops.     Increase activity slowly    Complete by:  As directed            F/u Labs/Studies: None  Discharge Medications    Medication List    TAKE these medications        albuterol (2.5 MG/3ML) 0.083% nebulizer solution  Commonly known as:  PROVENTIL  Take 2.5 mg by nebulization  every 6 (six) hours as needed for wheezing or shortness of breath.     aminocaproic acid 5 % Soln  Commonly known as:  AMICAR  Rinse with 10 mLs every hour for 10 hours and then as needed for persistent oozing. Use in a swish and spit manner.     amiodarone 200 MG tablet  Commonly known as:  PACERONE  Take 1 tablet (200 mg total) by mouth daily. For Atrial fibrillation/Hypertrophic Cardiomyopathy     amoxicillin-clavulanate 875-125 MG per tablet  Commonly known as:  AUGMENTIN  Take 1 tablet by mouth every 12 (twelve) hours.     atorvastatin 40 MG tablet  Commonly known as:  LIPITOR  Take 1 tablet (40 mg total) by mouth daily.     budesonide-formoterol 160-4.5 MCG/ACT inhaler  Commonly known as:  SYMBICORT  Inhale 2 puffs into the lungs 2 (two) times daily.     COMBIVENT RESPIMAT 20-100 MCG/ACT Aers respimat  Generic drug:  Ipratropium-Albuterol  Take 1 puff by mouth every 6 (six) hours as needed for wheezing or shortness of breath.     metoprolol 50 MG tablet  Commonly known as:  LOPRESSOR  Take 50 mg by mouth 2 (two) times daily.     nicotine 14 mg/24hr patch  Commonly known as:  NICODERM CQ - dosed in mg/24 hours  Place 1 patch (14 mg total) onto the skin daily.     oxyCODONE-acetaminophen 5-325 MG per tablet  Commonly known as:  PERCOCET/ROXICET  Take 1-2 tablets by mouth every 6 (six) hours as needed for moderate pain.     XARELTO 20 MG Tabs tablet  Generic drug:  rivaroxaban  Take 1 tablet (20 mg total) by mouth daily with supper.        Duration of Discharge Encounter   Greater than 30 minutes including physician time.  Signed, Caeleb Batalla PA-C 03/10/2015, 10:30 AM

## 2015-03-10 NOTE — Progress Notes (Signed)
POST OPERATIVE NOTE: Post op day #1  03/10/2015 Donald [name] [id number]  VITALS: BP 117/73 mmHg  Pulse 67  Temp(Src) 98.5 F (36.9 C) (Oral)  Resp 20  Ht  (1.803 m)  Wt 211 lb (95.709 kg)  BMI 29.44 kg/m2  SpO2 96%  LABS:  Lab Results  Component Value Date   WBC 9.4 03/10/2015   HGB 13.6 03/10/2015   HCT 41.2 03/10/2015   MCV 90.2 03/10/2015   PLT 161 03/10/2015   BMET    Component Value Date/Time   NA 135 03/10/2015 0440   K 3.7 03/10/2015 0440   CL 101 03/10/2015 0440   CO2 27 03/10/2015 0440   GLUCOSE 112* 03/10/2015 0440   BUN 19 03/10/2015 0440   CREATININE 0.98 03/10/2015 0440   CALCIUM 8.5* 03/10/2015 0440   GFRNONAA >60 03/10/2015 0440   GFRAA >60 03/10/2015 0440    Lab Results  Component Value Date   INR 1.02 03/09/2015   INR 1.06 02/27/2015   INR 1.78* 02/02/2015   No results found for: PTT   Donald [name] is status post multiple dental extractions with alveoloplasty and full mouth debridement of remaining dentition in the operating room with general anesthesia on 03/09/2015.  SUBJECTIVE: Patient with minimal discomfort. Patient is "ready to go home".  EXAM: No sign of infection, heme, or ooze. Sutures are intact. Clots are present.   ASSESSMENT: Post operative course is consistent with dental procedures performed in the operating room with general anesthesia.   PLAN: 1. Salt water rinses as needed to aid healing. 2. Okay for discharge from dental standpoint. 3. Suture removal in 7-10 days. 4. Please provide patient with prescription for Percocet 5/325. Patient is to take one to 2 tablets every 6 hours as needed for dental pain.   Charlynne Pander, DDS

## 2015-03-10 NOTE — Progress Notes (Signed)
    Subjective:  Denies CP or dyspnea; mouth "sore"   Objective:  Filed Vitals:   03/09/15 1102 03/09/15 2013 03/09/15 2213 03/10/15 0454  BP:  125/81  117/73  Pulse:  69 72 67  Temp: 98.2 F (36.8 C) 97.6 F (36.4 C)  98.5 F (36.9 C)  TempSrc:  Oral  Oral  Resp:  Height:      Weight:      SpO2:  97% 98% 96%    Intake/Output from previous day:  Intake/Output Summary (Last 24 hours) at 03/10/15 0851 Last data filed at 03/10/15 0849  Gross per 24 hour  Intake    720 ml  Output   1695 ml  Net   -975 ml    Physical Exam: Physical exam: Well-developed well-nourished in no acute distress.  Skin is warm and dry.  HEENT is normal.  Neck is supple.  Chest is clear to auscultation with normal expansion.  Cardiovascular exam is regular rate and rhythm. 2/6 systolic murmur Abdominal exam nontender or distended. No masses palpated. Extremities show no edema. neuro grossly intact    Lab Results: Basic Metabolic Panel:  Recent Labs  16/10/96 0654 03/10/15 0440  NA 137 135  K 5.0 3.7  CL 103 101  CO2 25 27  GLUCOSE 135* 112*  BUN 26* 19  CREATININE 1.14 0.98  CALCIUM 8.7* 8.5*   CBC:  Recent Labs  03/10/15 0440  WBC 9.4  HGB 13.6  HCT 41.2  MCV 90.2  PLT 161     Assessment/Plan:  1 status post dental extraction-management per dental surgery 2 severe aortic stenosis-after discharge patient will follow-up with Dr. Laneta Simmers to arrange aortic valve replacement. 3 history of paroxysmal atrial fibrillation-patient remains in sinus rhythm. xarelto resumed. Continue amiodarone. 4 history of pulmonary embolus- xarelto resumed 5 hypertension-continue metoprolol. 6 hyperlipidemia-continue statin. Patient will be discharged today and follow up with Dr. Laneta Simmers as scheduled > 30 min PA and physician time D2 Olga Millers 03/10/2015, 8:51 AM

## 2015-03-10 NOTE — Progress Notes (Signed)
Discussed with patient about discharge instructions, patient verbalized agreement and understanding.  Patient's IV was discontinued with no complications.  Patient to go out in wheelchair with belongings, will pick up locked belongings in ED and go by bus home. Morene Crocker, RN 03/10/2015 1050

## 2015-03-13 ENCOUNTER — Ambulatory Visit: Payer: Medicare HMO | Attending: Family Medicine | Admitting: Family Medicine

## 2015-03-13 ENCOUNTER — Encounter: Payer: Self-pay | Admitting: Family Medicine

## 2015-03-13 VITALS — BP 109/75 | HR 79 | Temp 98.1°F | Ht 71.0 in | Wt 208.0 lb

## 2015-03-13 DIAGNOSIS — Z7901 Long term (current) use of anticoagulants: Secondary | ICD-10-CM | POA: Diagnosis not present

## 2015-03-13 DIAGNOSIS — Z86711 Personal history of pulmonary embolism: Secondary | ICD-10-CM | POA: Diagnosis not present

## 2015-03-13 DIAGNOSIS — I482 Chronic atrial fibrillation, unspecified: Secondary | ICD-10-CM

## 2015-03-13 DIAGNOSIS — I10 Essential (primary) hypertension: Secondary | ICD-10-CM

## 2015-03-13 DIAGNOSIS — J449 Chronic obstructive pulmonary disease, unspecified: Secondary | ICD-10-CM | POA: Insufficient documentation

## 2015-03-13 DIAGNOSIS — Z720 Tobacco use: Secondary | ICD-10-CM | POA: Insufficient documentation

## 2015-03-13 DIAGNOSIS — J432 Centrilobular emphysema: Secondary | ICD-10-CM | POA: Diagnosis not present

## 2015-03-13 DIAGNOSIS — I350 Nonrheumatic aortic (valve) stenosis: Secondary | ICD-10-CM | POA: Diagnosis not present

## 2015-03-13 DIAGNOSIS — K0530 Chronic periodontitis, unspecified: Secondary | ICD-10-CM | POA: Diagnosis not present

## 2015-03-13 MED ORDER — ALBUTEROL SULFATE (2.5 MG/3ML) 0.083% IN NEBU: 2.5000 mg | INHALATION_SOLUTION | Freq: Four times a day (QID) | RESPIRATORY_TRACT | Status: AC | PRN

## 2015-03-13 NOTE — Progress Notes (Signed)
Transitional care clinic:  Date of telephone encounter: 03/10/15  PCP: None  Admit date: 03/09/15 Discharge date: 03/10/15  Subjective:  Patient ID: Donald [name], male    DOB: 22-Nov-1960  Age: 54 y.o. MRN: [id number]  CC: follow up  HPI Donald [name] is a 54 year old male with a history paroxysmal A. fib, previous history of PE (currently on anticoagulation with Xarelto), severe aortic stenosis, chronic dental caries status post multiple dental extraction, alveoloplasty and gross debridement  in anticipation of aortic valve replacement.  He was recently hospitalized on 03/09/15 for observation and remained in sinus rhythm throughout his course of hospitalization. He denied chest pains, shortness of breath and was advised to resume Xarelto on 03/11/15. Review of cardiac history reveals he had a cardiac cath on 02/28/15 with normal coronaries and normal left ventricular systolic function, normal PA pressure.There was severe aortic stenosis with peak gradient of 57 and mean gradient of 47 and calculated aortic valve area of 0.74 Centimeters squared. He was stable throughout his stay and was subsequently discharged.  Interval history: He presented to [facility] with neck pain after he slipped and fell and complains of neck pain for which he received Robaxin and diclofenac which he is yet to fill. He complains of reduced range of motion. He does have some shortness of breath which is that his baseline and is requesting a refill of his albuterol nebulizer. Continues to smoke and is not planning on quitting  Outpatient Prescriptions Prior to Visit  Medication Sig Dispense Refill  . albuterol (PROVENTIL) (2.5 MG/3ML) 0.083% nebulizer solution Take 2.5 mg by nebulization every 6 (six) hours as needed for wheezing or shortness of breath.     Marland Kitchen aminocaproic acid (AMICAR) 5 % SOLN Rinse with 10 mLs every hour for 10 hours and then as needed for persistent oozing. Use in a swish and spit  manner. 100 mL 0  . amiodarone (PACERONE) 200 MG tablet Take 1 tablet (200 mg total) by mouth daily. For Atrial fibrillation/Hypertrophic Cardiomyopathy 30 tablet 0  . amoxicillin-clavulanate (AUGMENTIN) 875-125 MG per tablet Take 1 tablet by mouth every 12 (twelve) hours. 15 tablet 0  . atorvastatin (LIPITOR) 40 MG tablet Take 1 tablet (40 mg total) by mouth daily. 30 tablet 3  . budesonide-formoterol (SYMBICORT) 160-4.5 MCG/ACT inhaler Inhale 2 puffs into the lungs 2 (two) times daily.    . COMBIVENT RESPIMAT 20-100 MCG/ACT AERS respimat Take 1 puff by mouth every 6 (six) hours as needed for wheezing or shortness of breath.     . metoprolol (LOPRESSOR) 50 MG tablet Take 50 mg by mouth 2 (two) times daily.    . nicotine (NICODERM CQ - DOSED IN MG/24 HOURS) 14 mg/24hr patch Place 1 patch (14 mg total) onto the skin daily. (Patient not taking: Reported on 03/07/2015) 28 patch 0  . oxyCODONE-acetaminophen (PERCOCET/ROXICET) 5-325 MG per tablet Take 1-2 tablets by mouth every 6 (six) hours as needed for moderate pain. 30 tablet 0  . XARELTO 20 MG TABS tablet Take 1 tablet (20 mg total) by mouth daily with supper. 30 tablet 0   No facility-administered medications prior to visit.    ROS Review of Systems General: negative for fever, weight loss, appetite change Eyes: no visual symptoms. ENT: no ear symptoms, no sinus tenderness, no nasal congestion or sore throat. Neck: + neck pain Respiratory: no wheezing, +shortness of breath which is at his baseline,+ cough Cardiovascular: no chest pain, no dyspnea on exertion, no pedal edema,  no orthopnea. Gastrointestinal: no abdominal pain, no diarrhea, no constipation Genito-Urinary: no urinary frequency, no dysuria, no polyuria. Hematologic: no bruising Endocrine: no cold or heat intolerance Neurological: no headaches, no seizures, no tremors Musculoskeletal: no joint pains, no joint swelling Skin: no pruritus, no rash. Psychological: no depression, no  anxiety,   Objective: Filed Vitals:   03/13/15 1138  BP: 109/75  Pulse: 79  Temp: 98.1 F (36.7 C)  Height: 5\' 11"  (1.803 m)  Weight: 208 lb (94.348 kg)  SpO2: 94%       BP/Weight 03/10/2015 03/09/2015 03/03/2015  Systolic BP 117 - 144  Diastolic BP 73 - 97  Wt. (Lbs) - 211 211.6  BMI - 29.44 29.53  Some encounter information is confidential and restricted. Go to Review Flowsheets activity to see all data.    Lab Results  Component Value Date   WBC 9.4 03/10/2015   HGB 13.6 03/10/2015   HCT 41.2 03/10/2015   PLT 161 03/10/2015   GLUCOSE 112* 03/10/2015   CHOL 208* 02/28/2015   TRIG 98 02/28/2015   HDL 44 02/28/2015   LDLCALC 144* 02/28/2015   ALT 32 03/09/2015   AST 17 03/09/2015   NA 135 03/10/2015   K 3.7 03/10/2015   CL 101 03/10/2015   CREATININE 0.98 03/10/2015   BUN 19 03/10/2015   CO2 27 03/10/2015   TSH 0.477 02/27/2015   INR 1.02 03/09/2015   HGBA1C 5.9* 02/28/2015    Physical Exam Constitutional: normal appearing,  Eyes: PERRLA HEENT: Head is atraumatic, normal sinuses, normal oropharynx, normal appearing tonsils and palate, tympanic membrane is normal bilaterally; upper jaw is edentulous Neck: Decreased range of motion, tenderness to palpation in both sternocleidomastoid bilaterally. Cardiovascular: normal rate and rhythm, normal heart sounds, 4/6 systolic murmur, rub or gallop, no pedal edema Respiratory: clear to auscultation bilaterally, no wheezes, no rales, no rhonchi Abdomen: soft, not tender to palpation, normal bowel sounds, no enlarged organs Extremities: Full ROM, no tenderness in joints Skin: warm and dry, no lesions. Neurological: alert, oriented x3, cranial nerves I-XII grossly intact , normal motor strength, normal sensation. Psychological: normal mood.   Assessment & Plan:   1. History of pulmonary embolism Currently on chronic anticoagulation with Xarelto  2. Benign essential HTN Controlled continue metoprolol  3. Severe  aortic stenosis Scheduled for aortic valve replacement. Appointment with cardiac surgeon in 03/22/15  4. Chronic atrial fibrillation Currently her rate control with metoprolol and anticoagulation with Xarelto  5. Chronic anticoagulation-Xarelto   6. Centrilobular emphysema Remains on Symbicort and Combivent. Educated about smoking cessation and fact that cessation which had progression of condition but he is indifferent  7. Neck Pain Advised to pickup Robaxin and diclofenac prescriptions which she received from [facility] this morning.  8. Tobacco abuse Smoking cessation support: smoking cessation hotline: 1-800-QUIT-NOW.  Smoking cessation classes are available through [facility]. Call [phone] or visit our website at [email or web address].  Spent 4 minutes counseling on smoking cessation and patient is not ready to quit.  9. Chronic periodontitis  Office of Oral surgeon called to obtain a follow up appointment for him.  No orders of the defined types were placed in this encounter.    Follow-up: Return in about 2 weeks (around 03/27/2015) for TCC with Dr Venetia Night.Jaclyn Shaggy MD

## 2015-03-13 NOTE — Progress Notes (Signed)
Patient here to establish care He is worried about his open heart surgery but is unaware of details surrounding surgery He is clean 35 days from "crack" He reports 10/10 pain in his neck from a near fall recently while walking outside He went to ED and was given Toradol injection and prescription for Robaxin

## 2015-03-15 ENCOUNTER — Ambulatory Visit: Payer: Self-pay | Admitting: Surgery

## 2015-03-20 ENCOUNTER — Encounter (INDEPENDENT_AMBULATORY_CARE_PROVIDER_SITE_OTHER): Payer: Self-pay

## 2015-03-20 ENCOUNTER — Ambulatory Visit (HOSPITAL_COMMUNITY): Payer: Self-pay | Admitting: Dentistry

## 2015-03-20 ENCOUNTER — Encounter (HOSPITAL_COMMUNITY): Payer: Self-pay | Admitting: Dentistry

## 2015-03-20 VITALS — BP 138/88 | HR 74 | Temp 98.4°F

## 2015-03-20 DIAGNOSIS — Z7901 Long term (current) use of anticoagulants: Secondary | ICD-10-CM

## 2015-03-20 DIAGNOSIS — I350 Nonrheumatic aortic (valve) stenosis: Secondary | ICD-10-CM

## 2015-03-20 DIAGNOSIS — K0820 Unspecified atrophy of edentulous alveolar ridge: Secondary | ICD-10-CM

## 2015-03-20 DIAGNOSIS — K08409 Partial loss of teeth, unspecified cause, unspecified class: Secondary | ICD-10-CM

## 2015-03-20 DIAGNOSIS — Z01818 Encounter for other preprocedural examination: Secondary | ICD-10-CM

## 2015-03-20 DIAGNOSIS — K029 Dental caries, unspecified: Secondary | ICD-10-CM

## 2015-03-20 NOTE — Progress Notes (Signed)
POST OPERATIVE NOTE:  03/20/2015   Donald [name] [id number]  VITALS: BP 138/88 mmHg  Pulse 74  Temp(Src) 98.4 F (36.9 C) (Oral)  LABS:  Lab Results  Component Value Date   WBC 9.4 03/10/2015   HGB 13.6 03/10/2015   HCT 41.2 03/10/2015   MCV 90.2 03/10/2015   PLT 161 03/10/2015   BMET    Component Value Date/Time   NA 135 03/10/2015 0440   K 3.7 03/10/2015 0440   CL 101 03/10/2015 0440   CO2 27 03/10/2015 0440   GLUCOSE 112* 03/10/2015 0440   BUN 19 03/10/2015 0440   CREATININE 0.98 03/10/2015 0440   CALCIUM 8.5* 03/10/2015 0440   GFRNONAA >60 03/10/2015 0440   GFRAA >60 03/10/2015 0440    Lab Results  Component Value Date   INR 1.02 03/09/2015   INR 1.06 02/27/2015   INR 1.78* 02/02/2015   No results found for: PTT   Brockton Mckesson Otte is status post extraction of remaining maxillary teeth with alveoloplasty and gross debridement of remaining dentition in the operative room with general anesthesia on 03/10/2015. The patient now presents for evaluation of healing and suture removal as indicated.  SUBJECTIVE: The patient denies having significant dental discomfort. Patient denies having any bleeding from the extraction sites. Patient denies having any stitches that remain.   EXAM: There is no sign of infection, heme, or ooze. No sutures remain. Patient is healing in by generalized primary closure but is healing in by secondary intention involving the upper right and upper left molar extraction sites. Patient has plaque accumulation on the lower teeth and oral hygiene improvement was highly suggested. Patient also has multiple dental caries involving the lower teeth that need to be restored once he is medically stable from the anticipated heart valve surgery by the primary dentist of his choice.  PROCEDURE: The patient was given a chlorhexidine gluconate rinse for 30 seconds. No sutures were present.  ASSESSMENT: Post operative course is consistent with dental  procedures performed in the OR.Marland Kitchen Loss of teeth due to extraction. Dental caries Chronic periodontitis Accretions  PLAN: 1. Brush teeth after meals and at bedtime. Toothbrush and toothpaste samples were provided to the patient. 2. Continue salt water rinses as needed to aid healing. 3. Patient is currently cleared for heart valve surgery. 4. Patient to follow-up with a primary dentist of his choice once he is medically stable from his anticipated heart valve surgery. Patient will need antibiotic premedication prior to invasive dental procedures as part of the American heart association guidelines.   Charlynne Pander, DDS

## 2015-03-20 NOTE — Patient Instructions (Addendum)
PLAN: 1. Brush teeth after meals and at bedtime. Toothbrush and toothpaste samples were provided to the patient. 2. Continue salt water rinses as needed to aid healing. 3. Patient is currently cleared for heart valve surgery. 4. Patient to follow-up with a primary dentist of his choice once he is medically stable from his anticipated heart valve surgery. Patient will need antibiotic premedication prior to invasive dental procedures as part of the American heart association guidelines.        [place]    Department of Dental Medicine     DR. KULINSKI      HEART VALVES AND MOUTH CARE:  FACTS:   If you have any infection in your mouth, it can infect your heart valve.  If you heart valve is infected, you will be seriously ill.  Infections in the mouth can be SILENT and do not always cause pain.  Examples of infections in the mouth are gum disease, dental cavities, and abscesses.  Some possible signs of infection are: Bad breath, bleeding gums, or teeth that are sensitive to sweets, hot, and/or cold. There are many other signs as well.  WHAT YOU HAVE TO DO:   Brush your teeth after meals and at bedtime. Spend at least 2 minutes brushing well, especially behind your back teeth and all around your teeth that stand alone. Brush at the gumline also.  Do not go to bed without brushing your teeth and flossing.  If you gums bleed when you brush or floss, do NOT stop brushing or flossing. It usually means that your gums need more attention and better cleaning.   If your Dentist or Dr. Kristin Bruins gave you a prescription mouthwash to use, make sure to use it as directed. If you run out of the medication, get a refill at the pharmacy.   If you were given any other medications or directions by your Dentist, please follow them. If you did not understand the directions or forget what you were told, please call. We will be happy to refresh her memory.  If you need antibiotics before dental  procedures, make sure you take them one hour prior to every dental visit as directed.   Get a dental checkup every 4-6 months in order to keep your mouth healthy, or to find and treat any new infection. You will most likely need your teeth cleaned or gums treated at the same time.  If you are not able to come in for your scheduled appointment, call your Dentist as soon as possible to reschedule.  If you have a problem in between dental visits, call your Dentist.       MOUTH CARE AFTER SURGERY  FACTS:  Ice used in ice bag helps keep the swelling down, and can help lessen the pain.  It is easier to treat pain BEFORE it happens.  Spitting disturbs the clot and may cause bleeding to start again, or to get worse.  Smoking delays healing and can cause complications.  Sharing prescriptions can be dangerous.  Do not take medications not recently prescribed for you.  Antibiotics may stop birth control pills from working.  Use other means of birth control while on antibiotics.  Warm salt water rinses after the first 24 hours will help lessen the swelling:  Use 1/2 teaspoonful of table salt per oz.of water.  DO NOT:  Do not spit.  Do not drink through a straw.  Strongly advised not to smoke, dip snuff or chew tobacco at least for 3  days.  Do not eat sharp or crunchy foods.  Avoid the area of surgery when chewing.  Do not stop your antibiotics before your instructions say to do so.  Do not eat hot foods until bleeding has stopped.  If you need to, let your food cool down to room temperature.  EXPECT:  Some swelling, especially first 2-3 days.  Soreness or discomfort in varying degrees.  Follow your dentist's instructions about how to handle pain before it starts.  Pinkish saliva or light blood in saliva, or on your pillow in the morning.  This can last around 24 hours.  Bruising inside or outside the mouth.  This may not show up until 2-3 days after surgery.  Don't worry, it  will go away in time.  Pieces of "bone" may work themselves loose.  It's OK.  If they bother you, let us know.  WHAT TO DO IMMEDIATELY AFTER SURGERY:  Bite on the gauze with steady pressure for 1-2 hours.  Don't chew on the gauze.  Do not lie down flat.  Raise your head support especially for the first 24 hours.  Apply ice to your face on the side of the surgery.  You may apply it 20 minutes on and a few minutes off.  Ice for 8-12 hours.  You may use ice up to 24 hours.  Before the numbness wears off, take a pain pill as instructed.  Prescription pain medication is not always required.  SWELLING:  Expect swelling for the first couple of days.  It should get better after that.  If swelling increases 3 days or so after surgery; let us know as soon as possible.  FEVER:  Take Tylenol every 4 hours if needed to lower your temperature, especially if it is at 100F or higher.  Drink lots of fluids.  If the fever does not go away, let us know.  BREATHING TROUBLE:  Any unusual difficulty breathing means you have to have someone bring you to the emergency room ASAP  BLEEDING:  Light oozing is expected for 24 hours or so.  Prop head up with pillows  Avoid spitting  Do not confuse bright red fresh flowing blood with lots of saliva colored with a little bit of blood.  If you notice some bleeding, place gauze or a tea bag where it is bleeding and apply CONSTANT pressure by biting down for 1 hour.  Avoid talking during this time.  Do not remove the gauze or tea bag during this hour to "check" the bleeding.  If you notice bright RED bleeding FLOWING out of particular area, and filling the floor of your mouth, put a wad of gauze on that area, bite down firmly and constantly.  Call us immediately.  If we're closed, have someone bring you to the emergency room.  ORAL HYGIENE:  Brush your teeth as usual after meals and before bedtime.  Use a soft toothbrush around the area of  surgery.  DO NOT AVOID BRUSHING.  Otherwise bacteria(germs) will grow and may delay healing or encourage infection.  Since you cannot spit, just gently rinse and let the water flow out of your mouth.  DO NOT SWISH HARD.  EATING:  Cool liquids are a good point to start.  Increase to soft foods as tolerated.  PRESCRIPTIONS:  Follow the directions for your prescriptions exactly as written.  If Dr. Kristin Bruins gave you a narcotic pain medication, do not drive, operate machinery or drink alcohol when on that medication.  QUESTIONS:  Call our office during office hours [phone] or call the Emergency Room at [phone].

## 2015-03-22 ENCOUNTER — Other Ambulatory Visit: Payer: Self-pay | Admitting: *Deleted

## 2015-03-22 ENCOUNTER — Ambulatory Visit (INDEPENDENT_AMBULATORY_CARE_PROVIDER_SITE_OTHER): Payer: Medicare HMO | Admitting: Surgery

## 2015-03-22 ENCOUNTER — Encounter: Payer: Self-pay | Admitting: Surgery

## 2015-03-22 VITALS — BP 126/84 | HR 79 | Resp 20 | Ht 71.0 in | Wt 208.0 lb

## 2015-03-22 DIAGNOSIS — I350 Nonrheumatic aortic (valve) stenosis: Secondary | ICD-10-CM

## 2015-03-22 NOTE — Progress Notes (Signed)
      HPI:  Mr. Ganaway returns today to follow up on his severe aortic stenosis and make plans for valve replacement following dental extractions by Dr. Kristin Bruins. He says that he feels well and is back working. He continues to smoke 1 ppd. He denies drug use.   Current Outpatient Prescriptions  Medication Sig Dispense Refill  . albuterol (PROVENTIL) (2.5 MG/3ML) 0.083% nebulizer solution Take 3 mLs (2.5 mg total) by nebulization every 6 (six) hours as needed for wheezing or shortness of breath. 75 mL 3  . amiodarone (PACERONE) 200 MG tablet Take 1 tablet (200 mg total) by mouth daily. For Atrial fibrillation/Hypertrophic Cardiomyopathy 30 tablet 0  . atorvastatin (LIPITOR) 40 MG tablet Take 1 tablet (40 mg total) by mouth daily. 30 tablet 3  . budesonide-formoterol (SYMBICORT) 160-4.5 MCG/ACT inhaler Inhale 2 puffs into the lungs 2 (two) times daily.    . COMBIVENT RESPIMAT 20-100 MCG/ACT AERS respimat Take 1 puff by mouth every 6 (six) hours as needed for wheezing or shortness of breath.     . metoprolol (LOPRESSOR) 50 MG tablet Take 50 mg by mouth 2 (two) times daily.    Carlena Hurl 20 MG TABS tablet Take 1 tablet (20 mg total) by mouth daily with supper. 30 tablet 0   No current facility-administered medications for this visit.     Physical Exam: BP 126/84 mmHg  Pulse 79  Resp 20  Ht  (1.803 m)  Wt 208 lb (94.348 kg)  BMI 29.02 kg/m2  SpO2 97% He looks well Cardiac exam shows a regular rate and rhythm with a 3/6 systolic murmur along the RSB. Lungs are clear  Diagnostic Tests:  None today.  Impression:  He has severe aortic stenosis with normal coronaries and normal LV function. I plan aortic valve replacement using a tissue valve given his social situation and history of medical non-compliance. I discussed the risk of structural valve deterioration possibly requiring another surgery in the future and he understands this and agrees with using a tissue valve. He would  like to wait until October 14  to do this for financial reasons with his job.   Plan:  AVR using a tissue valve on 04/14/2015   Alleen Borne, MD [facility] [phone]

## 2015-03-23 ENCOUNTER — Telehealth: Payer: Self-pay

## 2015-03-23 NOTE — Telephone Encounter (Signed)
This Case Manager placed call to patient to check on status and to discuss need for follow-up appointment with Dr. Venetia Night. Patient busy working on a car when this Case Manager called. Discussed need for follow-up appointment with Dr. Venetia Night and informed patient of available appointments on 03/27/15 or 03/28/15. Patient indicated he need to check his schedule before making an appointment. He indicated he would call this Case Manager back at a later time. Awaiting return call.

## 2015-03-28 ENCOUNTER — Telehealth: Payer: Self-pay

## 2015-03-28 NOTE — Telephone Encounter (Signed)
Call placed to the patient to discuss scheduling a follow up appointment with Dr Venetia Night.  He said that he scheduled an appt with Dr Reche Dixon at [facility] in [facility] who has been in PCP in the past. He noted that he he going to go to see him ( Dr Reche Dixon) again but may end up changing doctors. He said that he will call the clinic if he would like to schedule any further appointments and he thanked this CM for the call.

## 2015-04-11 ENCOUNTER — Other Ambulatory Visit: Payer: Self-pay | Admitting: *Deleted

## 2015-04-12 ENCOUNTER — Inpatient Hospital Stay (HOSPITAL_COMMUNITY): Admission: RE | Admit: 2015-04-12 | Payer: Self-pay | Source: Ambulatory Visit

## 2015-04-12 ENCOUNTER — Ambulatory Visit (HOSPITAL_COMMUNITY): Payer: Medicare HMO

## 2015-04-14 ENCOUNTER — Encounter (HOSPITAL_COMMUNITY): Admission: RE | Payer: Self-pay | Source: Ambulatory Visit

## 2015-04-14 ENCOUNTER — Inpatient Hospital Stay (HOSPITAL_COMMUNITY): Admission: RE | Admit: 2015-04-14 | Payer: Medicare HMO | Source: Ambulatory Visit | Admitting: Surgery

## 2015-04-14 SURGERY — REPLACEMENT, AORTIC VALVE, OPEN
Anesthesia: General | Site: Chest

## 2015-04-18 ENCOUNTER — Other Ambulatory Visit: Payer: Self-pay | Admitting: *Deleted

## 2015-04-18 DIAGNOSIS — I350 Nonrheumatic aortic (valve) stenosis: Secondary | ICD-10-CM

## 2015-04-19 ENCOUNTER — Ambulatory Visit (HOSPITAL_BASED_OUTPATIENT_CLINIC_OR_DEPARTMENT_OTHER)
Admission: RE | Admit: 2015-04-19 | Discharge: 2015-04-19 | Disposition: A | Discharge status: Home / Self Care | Payer: Medicare HMO | Source: Ambulatory Visit | Attending: Surgery | Admitting: Surgery

## 2015-04-19 ENCOUNTER — Ambulatory Visit (HOSPITAL_COMMUNITY)
Admission: RE | Admit: 2015-04-19 | Discharge: 2015-04-19 | Disposition: A | Discharge status: Home / Self Care | Payer: Medicare HMO | Source: Ambulatory Visit | Attending: Surgery | Admitting: Surgery

## 2015-04-19 ENCOUNTER — Encounter (HOSPITAL_COMMUNITY)
Admission: RE | Admit: 2015-04-19 | Discharge: 2015-04-19 | Disposition: A | Discharge status: Home / Self Care | Payer: Medicare HMO | Source: Ambulatory Visit | Attending: Surgery | Admitting: Surgery

## 2015-04-19 ENCOUNTER — Encounter (HOSPITAL_COMMUNITY): Payer: Self-pay

## 2015-04-19 VITALS — BP 139/98 | HR 59 | Temp 97.6°F | Resp 18 | Ht 71.0 in | Wt 201.9 lb

## 2015-04-19 DIAGNOSIS — I350 Nonrheumatic aortic (valve) stenosis: Secondary | ICD-10-CM

## 2015-04-19 HISTORY — DX: Nonrheumatic aortic (valve) stenosis: I35.0

## 2015-04-19 HISTORY — DX: Pneumonia, unspecified organism: J18.9

## 2015-04-19 LAB — BLOOD GAS, ARTERIAL
Acid-Base Excess: 0.2 mmol/L (ref 0.0–2.0)
Bicarbonate: 24.6 mEq/L — ABNORMAL HIGH (ref 20.0–24.0)
Drawn by: 421801
FIO2: 0.21
O2 Saturation: 96 %
PCO2 ART: 41.2 mmHg (ref 35.0–45.0)
PO2 ART: 79.6 mmHg — AB (ref 80.0–100.0)
Patient temperature: 98.6
TCO2: 25.8 mmol/L (ref 0–100)
pH, Arterial: 7.393 (ref 7.350–7.450)

## 2015-04-19 LAB — COMPREHENSIVE METABOLIC PANEL
ALBUMIN: 3.7 g/dL (ref 3.5–5.0)
ALK PHOS: 75 U/L (ref 38–126)
ALT: 22 U/L (ref 17–63)
ANION GAP: 10 (ref 5–15)
AST: 18 U/L (ref 15–41)
BUN: 19 mg/dL (ref 6–20)
CHLORIDE: 103 mmol/L (ref 101–111)
CO2: 24 mmol/L (ref 22–32)
Calcium: 8.9 mg/dL (ref 8.9–10.3)
Creatinine, Ser: 1.09 mg/dL (ref 0.61–1.24)
GFR calc non Af Amer: >60 mL/min (ref >60)
GLUCOSE: 100 mg/dL — AB (ref 65–99)
Potassium: 4.1 mmol/L (ref 3.5–5.1)
Sodium: 137 mmol/L (ref 135–145)
Total Bilirubin: 0.3 mg/dL (ref 0.3–1.2)
Total Protein: 6.8 g/dL (ref 6.5–8.1)

## 2015-04-19 LAB — URINALYSIS, ROUTINE W REFLEX MICROSCOPIC
BILIRUBIN URINE: NEGATIVE
GLUCOSE, UA: NEGATIVE mg/dL
HGB URINE DIPSTICK: NEGATIVE
KETONES UR: NEGATIVE mg/dL
Leukocytes, UA: NEGATIVE
Nitrite: NEGATIVE
PROTEIN: NEGATIVE mg/dL
Specific Gravity, Urine: 1.013 (ref 1.005–1.030)
UROBILINOGEN UA: 0.2 mg/dL (ref 0.0–1.0)
pH: 6.5 (ref 5.0–8.0)

## 2015-04-19 LAB — ABO/RH: ABO/RH(D): AB POS

## 2015-04-19 LAB — CBC
HEMATOCRIT: 47 % (ref 39.0–52.0)
HEMOGLOBIN: 15.5 g/dL (ref 13.0–17.0)
MCH: 29.6 pg (ref 26.0–34.0)
MCHC: 33 g/dL (ref 30.0–36.0)
MCV: 89.9 fL (ref 78.0–100.0)
Platelets: 219 10*3/uL (ref 150–400)
RBC: 5.23 MIL/uL (ref 4.22–5.81)
RDW: 13.3 % (ref 11.5–15.5)
WBC: 8.9 10*3/uL (ref 4.0–10.5)

## 2015-04-19 LAB — SURGICAL PCR SCREEN
MRSA, PCR: POSITIVE — AB
Staphylococcus aureus: POSITIVE — AB

## 2015-04-19 LAB — PROTIME-INR
INR: 1.03 (ref 0.00–1.49)
Prothrombin Time: 13.7 seconds (ref 11.6–15.2)

## 2015-04-19 LAB — APTT: aPTT: 29 seconds (ref 24–37)

## 2015-04-19 NOTE — Pre-Procedure Instructions (Signed)
    Linc E Wisser  04/19/2015      [id number] - [place],  - [address] [place] [id number] Phone: [phone] Fax: [phone]    Your procedure is scheduled on Friday, April 21, 2015  [facility].M.  Call this number if you have problems the morning of surgery:  [phone]   Remember: Bring in the Incentive Spirometer ( breathing device) and cardiac teaching packet on day of admission.   Do not eat food or drink liquids after midnight Thursday, April 20, 2015  Take these medicines the morning of surgery with A SIP OF WATER : amiodarone (PACERONE) metoprolol (LOPRESSOR), budesonide-formoterol (SYMBICORT) inhaler, if needed: albuterol (PROVENTIL) nebulizer for wheezing or shortness of breath, COMBIVENT RESPIMAT 20-100 MCG/ACT AERS respimat for wheezing or shortness of breath ( Bring inhaler in with you on day of procedure)  Stop taking vitamins and herbal medications. Do not take any NSAIDs ie: Ibuprofen, Advil, Naproxen or etc.; stop now.  Do not wear jewelry, make-up or nail polish.  Do not wear lotions, powders, or perfumes.  You may not wear deodorant.  Do not shave 48 hours prior to surgery.  Men may shave face and neck.  Do not bring valuables to the hospital.  [facility] is not responsible for any belongings or valuables.  Contacts, dentures or bridgework may not be worn into surgery.  Leave your suitcase in the car.  After surgery it may be brought to your room.  For patients admitted to the hospital, discharge time will be determined by your treatment team.  Patients discharged the day of surgery will not be allowed to drive home.   Name and phone number of your driver:    Special instructions: Shower the night before surgery and the morning of surgery with CHG.  Please read over the following fact sheets that you were given. Pain Booklet, Coughing and Deep  Breathing, Blood Transfusion Information, Open Heart Packet, MRSA Information and Surgical Site Infection Prevention

## 2015-04-19 NOTE — Progress Notes (Signed)
Pt denies SOB and chest pain. Pt denies having a stress test. Spoke with Jolene to make MD aware that pt last used crack cocaine 2-3 weeks ago and pt had an abnormal chest x ray. Pt stated that he had Mupirocin from previous admission ( negative PCR) and confirmed expiration date of 08 -2017. Pt stated that his last dose of Xarelto was Friday. Pt chart forwarded to [place], [place], anesthesia, for review.

## 2015-04-19 NOTE — Progress Notes (Addendum)
Pre-op Cardiac Surgery  Carotid Findings:  Carotid done 02/28/15 = 1-39% ICA stenosis.  Upper Extremity Right Left  Brachial Pressures 150 153  Radial Waveforms Tri Tri  Ulnar Waveforms Bi Bi  Palmar Arch (Allen's Test) Obliterates with radial compression, normal with ulnar compression Obliterates with radial compression, normal with ulnar compression   Farrel DemarkJill Eunice, RDMS, RVT 04/19/2015

## 2015-04-20 LAB — HEMOGLOBIN A1C
HEMOGLOBIN A1C: 5.8 % — AB (ref 4.8–5.6)
MEAN PLASMA GLUCOSE: 120 mg/dL

## 2015-04-20 MED ORDER — PLASMA-LYTE 148 IV SOLN
INTRAVENOUS | Status: DC
  Filled 2015-04-20: qty 2.5

## 2015-04-20 MED ORDER — VANCOMYCIN HCL 10 G IV SOLR
1250.0000 mg | INTRAVENOUS | Status: AC
  Administered 2015-04-21: 1250 mg via INTRAVENOUS
  Filled 2015-04-20: qty 1250

## 2015-04-20 MED ORDER — DEXMEDETOMIDINE HCL IN NACL 400 MCG/100ML IV SOLN
0.1000 ug/kg/h | INTRAVENOUS | Status: AC
  Administered 2015-04-21: .4 ug/kg/h via INTRAVENOUS
  Filled 2015-04-20: qty 100

## 2015-04-20 MED ORDER — DEXTROSE 5 % IV SOLN
750.0000 mg | INTRAVENOUS | Status: DC
  Filled 2015-04-20: qty 750

## 2015-04-20 MED ORDER — PHENYLEPHRINE HCL 10 MG/ML IJ SOLN
30.0000 ug/min | INTRAVENOUS | Status: DC
  Filled 2015-04-20: qty 2

## 2015-04-20 MED ORDER — DOPAMINE-DEXTROSE 3.2-5 MG/ML-% IV SOLN
0.0000 ug/kg/min | INTRAVENOUS | Status: DC
  Filled 2015-04-20: qty 250

## 2015-04-20 MED ORDER — CEFUROXIME SODIUM 1.5 G IJ SOLR
1.5000 g | INTRAMUSCULAR | Status: AC
  Administered 2015-04-21: .75 g via INTRAVENOUS
  Administered 2015-04-21: 1.5 g via INTRAVENOUS
  Filled 2015-04-20 (×2): qty 1.5

## 2015-04-20 MED ORDER — EPINEPHRINE HCL 1 MG/ML IJ SOLN
0.0000 ug/min | INTRAVENOUS | Status: DC
  Filled 2015-04-20: qty 4

## 2015-04-20 MED ORDER — MAGNESIUM SULFATE 50 % IJ SOLN
40.0000 meq | INTRAMUSCULAR | Status: DC
  Filled 2015-04-20: qty 10

## 2015-04-20 MED ORDER — HEPARIN SODIUM (PORCINE) 1000 UNIT/ML IJ SOLN
INTRAMUSCULAR | Status: DC
  Filled 2015-04-20: qty 30

## 2015-04-20 MED ORDER — SODIUM CHLORIDE 0.9 % IV SOLN
INTRAVENOUS | Status: AC
  Administered 2015-04-21: 69.8 mL/h via INTRAVENOUS
  Filled 2015-04-20: qty 40

## 2015-04-20 MED ORDER — SODIUM CHLORIDE 0.9 % IV SOLN
INTRAVENOUS | Status: AC
  Administered 2015-04-21: .9 [IU]/h via INTRAVENOUS
  Filled 2015-04-20: qty 2.5

## 2015-04-20 MED ORDER — POTASSIUM CHLORIDE 2 MEQ/ML IV SOLN
80.0000 meq | INTRAVENOUS | Status: DC
  Filled 2015-04-20: qty 40

## 2015-04-20 MED ORDER — NITROGLYCERIN IN D5W 200-5 MCG/ML-% IV SOLN
2.0000 ug/min | INTRAVENOUS | Status: AC
  Administered 2015-04-21: 10 ug/min via INTRAVENOUS
  Filled 2015-04-20: qty 250

## 2015-04-20 NOTE — Progress Notes (Signed)
 Anesthesia Follow-up: See my note from 03/08/15 prior to patient undergoing dental extractions. Now he is scheduled for AVR for severe AS on 04/14/2015 by Dr. Laneta SimmersBartle.  My previous note outlines patient's history and recent studies. Below is the new data since then: He reports last dose of Xarelto was 04/14/15.  04/19/15 EKG: SB at 52 bpm, possible LAE.  04/19/15 CXR: IMPRESSION: 1. Low lung volumes with mild basilar atelectasis. 2. Borderline cardiomegaly. No pulmonary venous congestion.  Preoperative labs noted. Of note, patient with known history of ETOH and cocaine abuse. Had refrained prior to dental surgery, but did report use of cocaine within the last 2-3 weeks. TCTS RN Jolene make aware yesterday. Will defer decision for UDS on arrival to surgeon and/or anesthesiologist.  Shonna ChockAllison Devonn Giampietro, PA-C [facility]/Anesthesiology Phone [phone]  10:22 AM

## 2015-04-21 ENCOUNTER — Inpatient Hospital Stay (HOSPITAL_COMMUNITY)
Admission: RE | Admit: 2015-04-21 | Discharge: 2015-05-02 | DRG: 219 | Disposition: E | Payer: Medicare HMO | Source: Ambulatory Visit | Attending: Surgery | Admitting: Surgery

## 2015-04-21 ENCOUNTER — Inpatient Hospital Stay (HOSPITAL_COMMUNITY): Payer: Medicare HMO

## 2015-04-21 ENCOUNTER — Encounter (HOSPITAL_COMMUNITY): Admission: RE | Disposition: E | Payer: Medicare HMO | Source: Ambulatory Visit | Attending: Surgery

## 2015-04-21 ENCOUNTER — Inpatient Hospital Stay (HOSPITAL_COMMUNITY): Payer: Medicare HMO | Admitting: Certified Registered Nurse Anesthetist

## 2015-04-21 ENCOUNTER — Encounter (HOSPITAL_COMMUNITY): Payer: Self-pay | Admitting: *Deleted

## 2015-04-21 ENCOUNTER — Inpatient Hospital Stay (HOSPITAL_COMMUNITY): Payer: Medicare HMO | Admitting: Vascular Surgery

## 2015-04-21 DIAGNOSIS — I469 Cardiac arrest, cause unspecified: Secondary | ICD-10-CM | POA: Diagnosis not present

## 2015-04-21 DIAGNOSIS — D65 Disseminated intravascular coagulation [defibrination syndrome]: Secondary | ICD-10-CM | POA: Diagnosis not present

## 2015-04-21 DIAGNOSIS — K7200 Acute and subacute hepatic failure without coma: Secondary | ICD-10-CM | POA: Diagnosis not present

## 2015-04-21 DIAGNOSIS — I462 Cardiac arrest due to underlying cardiac condition: Secondary | ICD-10-CM | POA: Diagnosis not present

## 2015-04-21 DIAGNOSIS — N179 Acute kidney failure, unspecified: Secondary | ICD-10-CM

## 2015-04-21 DIAGNOSIS — J441 Chronic obstructive pulmonary disease with (acute) exacerbation: Secondary | ICD-10-CM | POA: Diagnosis not present

## 2015-04-21 DIAGNOSIS — Z452 Encounter for adjustment and management of vascular access device: Secondary | ICD-10-CM

## 2015-04-21 DIAGNOSIS — Z006 Encounter for examination for normal comparison and control in clinical research program: Secondary | ICD-10-CM

## 2015-04-21 DIAGNOSIS — I313 Pericardial effusion (noninflammatory): Secondary | ICD-10-CM | POA: Diagnosis not present

## 2015-04-21 DIAGNOSIS — Z590 Homelessness: Secondary | ICD-10-CM

## 2015-04-21 DIAGNOSIS — I252 Old myocardial infarction: Secondary | ICD-10-CM

## 2015-04-21 DIAGNOSIS — I472 Ventricular tachycardia: Secondary | ICD-10-CM | POA: Diagnosis not present

## 2015-04-21 DIAGNOSIS — R570 Cardiogenic shock: Secondary | ICD-10-CM | POA: Diagnosis not present

## 2015-04-21 DIAGNOSIS — R011 Cardiac murmur, unspecified: Secondary | ICD-10-CM | POA: Diagnosis present

## 2015-04-21 DIAGNOSIS — R0602 Shortness of breath: Secondary | ICD-10-CM

## 2015-04-21 DIAGNOSIS — I4901 Ventricular fibrillation: Secondary | ICD-10-CM | POA: Diagnosis not present

## 2015-04-21 DIAGNOSIS — D62 Acute posthemorrhagic anemia: Secondary | ICD-10-CM | POA: Diagnosis not present

## 2015-04-21 DIAGNOSIS — Z978 Presence of other specified devices: Secondary | ICD-10-CM

## 2015-04-21 DIAGNOSIS — J9602 Acute respiratory failure with hypercapnia: Secondary | ICD-10-CM | POA: Diagnosis not present

## 2015-04-21 DIAGNOSIS — R109 Unspecified abdominal pain: Secondary | ICD-10-CM

## 2015-04-21 DIAGNOSIS — F17210 Nicotine dependence, cigarettes, uncomplicated: Secondary | ICD-10-CM | POA: Diagnosis present

## 2015-04-21 DIAGNOSIS — E875 Hyperkalemia: Secondary | ICD-10-CM | POA: Diagnosis not present

## 2015-04-21 DIAGNOSIS — K5900 Constipation, unspecified: Secondary | ICD-10-CM | POA: Diagnosis not present

## 2015-04-21 DIAGNOSIS — I4891 Unspecified atrial fibrillation: Secondary | ICD-10-CM | POA: Diagnosis present

## 2015-04-21 DIAGNOSIS — Z8673 Personal history of transient ischemic attack (TIA), and cerebral infarction without residual deficits: Secondary | ICD-10-CM

## 2015-04-21 DIAGNOSIS — I480 Paroxysmal atrial fibrillation: Secondary | ICD-10-CM | POA: Diagnosis not present

## 2015-04-21 DIAGNOSIS — Z952 Presence of prosthetic heart valve: Secondary | ICD-10-CM

## 2015-04-21 DIAGNOSIS — R579 Shock, unspecified: Secondary | ICD-10-CM

## 2015-04-21 DIAGNOSIS — I4892 Unspecified atrial flutter: Secondary | ICD-10-CM | POA: Diagnosis not present

## 2015-04-21 DIAGNOSIS — Z954 Presence of other heart-valve replacement: Secondary | ICD-10-CM | POA: Diagnosis not present

## 2015-04-21 DIAGNOSIS — E872 Acidosis: Secondary | ICD-10-CM | POA: Diagnosis not present

## 2015-04-21 DIAGNOSIS — Z9689 Presence of other specified functional implants: Secondary | ICD-10-CM

## 2015-04-21 DIAGNOSIS — D696 Thrombocytopenia, unspecified: Secondary | ICD-10-CM | POA: Diagnosis present

## 2015-04-21 DIAGNOSIS — J9601 Acute respiratory failure with hypoxia: Secondary | ICD-10-CM | POA: Diagnosis not present

## 2015-04-21 DIAGNOSIS — I350 Nonrheumatic aortic (valve) stenosis: Secondary | ICD-10-CM | POA: Diagnosis not present

## 2015-04-21 DIAGNOSIS — N170 Acute kidney failure with tubular necrosis: Secondary | ICD-10-CM | POA: Diagnosis not present

## 2015-04-21 DIAGNOSIS — Z86711 Personal history of pulmonary embolism: Secondary | ICD-10-CM

## 2015-04-21 DIAGNOSIS — I482 Chronic atrial fibrillation: Secondary | ICD-10-CM | POA: Diagnosis present

## 2015-04-21 DIAGNOSIS — I2699 Other pulmonary embolism without acute cor pulmonale: Secondary | ICD-10-CM | POA: Diagnosis not present

## 2015-04-21 DIAGNOSIS — Z66 Do not resuscitate: Secondary | ICD-10-CM | POA: Diagnosis present

## 2015-04-21 HISTORY — PX: TEE WITHOUT CARDIOVERSION: SHX5443

## 2015-04-21 HISTORY — PX: AORTIC VALVE REPLACEMENT: SHX41

## 2015-04-21 LAB — POCT I-STAT 4, (NA,K, GLUC, HGB,HCT)
Glucose, Bld: 124 mg/dL — ABNORMAL HIGH (ref 65–99)
HCT: 32 % — ABNORMAL LOW (ref 39.0–52.0)
HEMOGLOBIN: 10.9 g/dL — AB (ref 13.0–17.0)
POTASSIUM: 4.5 mmol/L (ref 3.5–5.1)
Sodium: 138 mmol/L (ref 135–145)

## 2015-04-21 LAB — POCT I-STAT, CHEM 8
BUN: 17 mg/dL (ref 6–20)
BUN: 16 mg/dL (ref 6–20)
BUN: 16 mg/dL (ref 6–20)
BUN: 17 mg/dL (ref 6–20)
BUN: 18 mg/dL (ref 6–20)
BUN: 15 mg/dL (ref 6–20)
CALCIUM ION: 1.25 mmol/L — AB (ref 1.12–1.23)
CALCIUM ION: 1.07 mmol/L — AB (ref 1.12–1.23)
CALCIUM ION: 1.09 mmol/L — AB (ref 1.12–1.23)
CHLORIDE: 103 mmol/L (ref 101–111)
CHLORIDE: 103 mmol/L (ref 101–111)
CHLORIDE: 101 mmol/L (ref 101–111)
CHLORIDE: 99 mmol/L — AB (ref 101–111)
CREATININE: 0.9 mg/dL (ref 0.61–1.24)
CREATININE: 0.9 mg/dL (ref 0.61–1.24)
Calcium, Ion: 1.2 mmol/L (ref 1.12–1.23)
Calcium, Ion: 1.05 mmol/L — ABNORMAL LOW (ref 1.12–1.23)
Calcium, Ion: 1.24 mmol/L — ABNORMAL HIGH (ref 1.12–1.23)
Chloride: 101 mmol/L (ref 101–111)
Chloride: 105 mmol/L (ref 101–111)
Creatinine, Ser: 0.9 mg/dL (ref 0.61–1.24)
Creatinine, Ser: 0.7 mg/dL (ref 0.61–1.24)
Creatinine, Ser: 0.7 mg/dL (ref 0.61–1.24)
Creatinine, Ser: 1 mg/dL (ref 0.61–1.24)
GLUCOSE: 111 mg/dL — AB (ref 65–99)
GLUCOSE: 132 mg/dL — AB (ref 65–99)
Glucose, Bld: 109 mg/dL — ABNORMAL HIGH (ref 65–99)
Glucose, Bld: 115 mg/dL — ABNORMAL HIGH (ref 65–99)
Glucose, Bld: 124 mg/dL — ABNORMAL HIGH (ref 65–99)
Glucose, Bld: 122 mg/dL — ABNORMAL HIGH (ref 65–99)
HCT: 29 % — ABNORMAL LOW (ref 39.0–52.0)
HCT: 31 % — ABNORMAL LOW (ref 39.0–52.0)
HEMATOCRIT: 41 % (ref 39.0–52.0)
HEMATOCRIT: 39 % (ref 39.0–52.0)
HEMATOCRIT: 31 % — AB (ref 39.0–52.0)
HEMATOCRIT: 32 % — AB (ref 39.0–52.0)
HEMOGLOBIN: 9.9 g/dL — AB (ref 13.0–17.0)
HEMOGLOBIN: 10.5 g/dL — AB (ref 13.0–17.0)
HEMOGLOBIN: 10.9 g/dL — AB (ref 13.0–17.0)
Hemoglobin: 13.9 g/dL (ref 13.0–17.0)
Hemoglobin: 13.3 g/dL (ref 13.0–17.0)
Hemoglobin: 10.5 g/dL — ABNORMAL LOW (ref 13.0–17.0)
POTASSIUM: 4.2 mmol/L (ref 3.5–5.1)
POTASSIUM: 4.9 mmol/L (ref 3.5–5.1)
POTASSIUM: 4.2 mmol/L (ref 3.5–5.1)
Potassium: 4.3 mmol/L (ref 3.5–5.1)
Potassium: 5.2 mmol/L — ABNORMAL HIGH (ref 3.5–5.1)
Potassium: 4.8 mmol/L (ref 3.5–5.1)
SODIUM: 141 mmol/L (ref 135–145)
SODIUM: 139 mmol/L (ref 135–145)
SODIUM: 136 mmol/L (ref 135–145)
SODIUM: 136 mmol/L (ref 135–145)
Sodium: 134 mmol/L — ABNORMAL LOW (ref 135–145)
Sodium: 135 mmol/L (ref 135–145)
TCO2: 28 mmol/L (ref 0–100)
TCO2: 27 mmol/L (ref 0–100)
TCO2: 23 mmol/L (ref 0–100)
TCO2: 26 mmol/L (ref 0–100)
TCO2: 27 mmol/L (ref 0–100)
TCO2: 23 mmol/L (ref 0–100)

## 2015-04-21 LAB — POCT I-STAT 3, ART BLOOD GAS (G3+)
ACID-BASE DEFICIT: 1 mmol/L (ref 0.0–2.0)
ACID-BASE DEFICIT: 2 mmol/L (ref 0.0–2.0)
ACID-BASE DEFICIT: 2 mmol/L (ref 0.0–2.0)
ACID-BASE EXCESS: 2 mmol/L (ref 0.0–2.0)
Acid-Base Excess: 1 mmol/L (ref 0.0–2.0)
Acid-base deficit: 2 mmol/L (ref 0.0–2.0)
Acid-base deficit: 1 mmol/L (ref 0.0–2.0)
BICARBONATE: 26.2 meq/L — AB (ref 20.0–24.0)
BICARBONATE: 27.4 meq/L — AB (ref 20.0–24.0)
BICARBONATE: 25.5 meq/L — AB (ref 20.0–24.0)
Bicarbonate: 28.7 mEq/L — ABNORMAL HIGH (ref 20.0–24.0)
Bicarbonate: 25.1 mEq/L — ABNORMAL HIGH (ref 20.0–24.0)
Bicarbonate: 25.2 mEq/L — ABNORMAL HIGH (ref 20.0–24.0)
Bicarbonate: 24.8 mEq/L — ABNORMAL HIGH (ref 20.0–24.0)
O2 SAT: 100 %
O2 SAT: 96 %
O2 Saturation: 100 %
O2 Saturation: 100 %
O2 Saturation: 94 %
O2 Saturation: 99 %
O2 Saturation: 89 %
PCO2 ART: 56.7 mmHg — AB (ref 35.0–45.0)
PCO2 ART: 50.6 mmHg — AB (ref 35.0–45.0)
PCO2 ART: 45.6 mmHg — AB (ref 35.0–45.0)
PH ART: 7.458 — AB (ref 7.350–7.450)
PH ART: 7.237 — AB (ref 7.350–7.450)
PH ART: 7.254 — AB (ref 7.350–7.450)
PH ART: 7.296 — AB (ref 7.350–7.450)
PH ART: 7.343 — AB (ref 7.350–7.450)
PH ART: 7.327 — AB (ref 7.350–7.450)
PO2 ART: 79 mmHg — AB (ref 80.0–100.0)
PO2 ART: 58 mmHg — AB (ref 80.0–100.0)
Patient temperature: 35.6
TCO2: 27 mmol/L (ref 0–100)
TCO2: 30 mmol/L (ref 0–100)
TCO2: 29 mmol/L (ref 0–100)
TCO2: 27 mmol/L (ref 0–100)
TCO2: 27 mmol/L (ref 0–100)
TCO2: 27 mmol/L (ref 0–100)
TCO2: 26 mmol/L (ref 0–100)
pCO2 arterial: 35.6 mmHg (ref 35.0–45.0)
pCO2 arterial: 51.4 mmHg — ABNORMAL HIGH (ref 35.0–45.0)
pCO2 arterial: 63.6 mmHg (ref 35.0–45.0)
pCO2 arterial: 46.9 mmHg — ABNORMAL HIGH (ref 35.0–45.0)
pH, Arterial: 7.355 (ref 7.350–7.450)
pO2, Arterial: 292 mmHg — ABNORMAL HIGH (ref 80.0–100.0)
pO2, Arterial: 501 mmHg — ABNORMAL HIGH (ref 80.0–100.0)
pO2, Arterial: 237 mmHg — ABNORMAL HIGH (ref 80.0–100.0)
pO2, Arterial: 129 mmHg — ABNORMAL HIGH (ref 80.0–100.0)
pO2, Arterial: 82 mmHg (ref 80.0–100.0)

## 2015-04-21 LAB — POCT I-STAT 3, VENOUS BLOOD GAS (G3P V)
Acid-base deficit: 1 mmol/L (ref 0.0–2.0)
Bicarbonate: 24.5 mEq/L — ABNORMAL HIGH (ref 20.0–24.0)
O2 SAT: 74 %
PCO2 VEN: 37.4 mmHg — AB (ref 45.0–50.0)
PO2 VEN: 31 mmHg (ref 30.0–45.0)
Patient temperature: 33
TCO2: 26 mmol/L (ref 0–100)
pH, Ven: 7.406 — ABNORMAL HIGH (ref 7.250–7.300)

## 2015-04-21 LAB — CBC
HCT: 33.6 % — ABNORMAL LOW (ref 39.0–52.0)
HEMATOCRIT: 37.1 % — AB (ref 39.0–52.0)
HEMOGLOBIN: 11.9 g/dL — AB (ref 13.0–17.0)
Hemoglobin: 10.9 g/dL — ABNORMAL LOW (ref 13.0–17.0)
MCH: 29.6 pg (ref 26.0–34.0)
MCH: 29.2 pg (ref 26.0–34.0)
MCHC: 32.4 g/dL (ref 30.0–36.0)
MCHC: 32.1 g/dL (ref 30.0–36.0)
MCV: 91.3 fL (ref 78.0–100.0)
MCV: 90.9 fL (ref 78.0–100.0)
PLATELETS: 127 10*3/uL — AB (ref 150–400)
Platelets: 130 10*3/uL — ABNORMAL LOW (ref 150–400)
RBC: 3.68 MIL/uL — AB (ref 4.22–5.81)
RBC: 4.08 MIL/uL — ABNORMAL LOW (ref 4.22–5.81)
RDW: 13.8 % (ref 11.5–15.5)
RDW: 13.8 % (ref 11.5–15.5)
WBC: 12.9 10*3/uL — AB (ref 4.0–10.5)
WBC: 14.4 10*3/uL — AB (ref 4.0–10.5)

## 2015-04-21 LAB — GLUCOSE, CAPILLARY
GLUCOSE-CAPILLARY: 135 mg/dL — AB (ref 65–99)
GLUCOSE-CAPILLARY: 121 mg/dL — AB (ref 65–99)

## 2015-04-21 LAB — PROTIME-INR
INR: 1.53 — AB (ref 0.00–1.49)
PROTHROMBIN TIME: 18.4 s — AB (ref 11.6–15.2)

## 2015-04-21 LAB — APTT: aPTT: 41 seconds — ABNORMAL HIGH (ref 24–37)

## 2015-04-21 LAB — CREATININE, SERUM
CREATININE: 1.02 mg/dL (ref 0.61–1.24)
GFR calc Af Amer: >60 mL/min (ref >60)

## 2015-04-21 LAB — HEMOGLOBIN AND HEMATOCRIT, BLOOD
HCT: 31 % — ABNORMAL LOW (ref 39.0–52.0)
Hemoglobin: 10.1 g/dL — ABNORMAL LOW (ref 13.0–17.0)

## 2015-04-21 LAB — MAGNESIUM: MAGNESIUM: 2.9 mg/dL — AB (ref 1.7–2.4)

## 2015-04-21 LAB — PLATELET COUNT: PLATELETS: 154 10*3/uL (ref 150–400)

## 2015-04-21 SURGERY — REPLACEMENT, AORTIC VALVE, OPEN
Anesthesia: General | Site: Chest

## 2015-04-21 MED ORDER — 0.9 % SODIUM CHLORIDE (POUR BTL) OPTIME
TOPICAL | Status: DC | PRN
  Administered 2015-04-21: 1000 mL
  Administered 2015-04-21: 6000 mL

## 2015-04-21 MED ORDER — TRAMADOL HCL 50 MG PO TABS: 50.0000 mg | ORAL_TABLET | ORAL | Status: DC | PRN

## 2015-04-21 MED ORDER — MIDAZOLAM HCL 2 MG/2ML IJ SOLN: 2.0000 mg | INTRAMUSCULAR | Status: DC | PRN

## 2015-04-21 MED ORDER — LACTATED RINGERS IV SOLN
INTRAVENOUS | Status: DC | PRN
  Administered 2015-04-21 (×4): via INTRAVENOUS

## 2015-04-21 MED ORDER — SODIUM CHLORIDE 0.45 % IV SOLN
INTRAVENOUS | Status: DC | PRN
  Administered 2015-04-21: 13:00:00 via INTRAVENOUS

## 2015-04-21 MED ORDER — CHLORHEXIDINE GLUCONATE 0.12 % MT SOLN
15.0000 mL | Freq: Once | OROMUCOSAL | Status: DC
  Filled 2015-04-21: qty 15

## 2015-04-21 MED ORDER — SODIUM CHLORIDE 0.9 % IJ SOLN
3.0000 mL | Freq: Two times a day (BID) | INTRAMUSCULAR | Status: DC
  Administered 2015-04-22 – 2015-04-25 (×6): 3 mL via INTRAVENOUS

## 2015-04-21 MED ORDER — MIDAZOLAM HCL 5 MG/5ML IJ SOLN
INTRAMUSCULAR | Status: DC | PRN
  Administered 2015-04-21 (×3): 2 mg via INTRAVENOUS
  Administered 2015-04-21: 3 mg via INTRAVENOUS

## 2015-04-21 MED ORDER — MORPHINE SULFATE (PF) 2 MG/ML IV SOLN
1.0000 mg | INTRAVENOUS | Status: DC | PRN
  Administered 2015-04-21: 2 mg via INTRAVENOUS

## 2015-04-21 MED ORDER — CHLORHEXIDINE GLUCONATE 4 % EX LIQD: 30.0000 mL | CUTANEOUS | Status: DC

## 2015-04-21 MED ORDER — HEPARIN SODIUM (PORCINE) 1000 UNIT/ML IJ SOLN
INTRAMUSCULAR | Status: DC | PRN
  Administered 2015-04-21: 34000 [IU] via INTRAVENOUS

## 2015-04-21 MED ORDER — SODIUM CHLORIDE 0.9 % IV SOLN
INTRAVENOUS | Status: DC
  Filled 2015-04-21 (×2): qty 2.5

## 2015-04-21 MED ORDER — SODIUM CHLORIDE 0.9 % IV SOLN
INTRAVENOUS | Status: DC
  Administered 2015-04-21: 1 mL via INTRAVENOUS
  Administered 2015-04-23: 15:00:00 via INTRAVENOUS
  Administered 2015-04-24: 1 mL via INTRAVENOUS

## 2015-04-21 MED ORDER — SODIUM CHLORIDE 0.9 % IV SOLN
250.0000 mL | INTRAVENOUS | Status: DC
  Administered 2015-04-22: 250 mL via INTRAVENOUS

## 2015-04-21 MED ORDER — ARTIFICIAL TEARS OP OINT
TOPICAL_OINTMENT | OPHTHALMIC | Status: DC | PRN
  Administered 2015-04-21: 1 via OPHTHALMIC

## 2015-04-21 MED ORDER — PROTAMINE SULFATE 10 MG/ML IV SOLN
INTRAVENOUS | Status: AC
  Filled 2015-04-21: qty 5

## 2015-04-21 MED ORDER — ARTIFICIAL TEARS OP OINT
TOPICAL_OINTMENT | OPHTHALMIC | Status: AC
  Filled 2015-04-21: qty 3.5

## 2015-04-21 MED ORDER — HYDROMORPHONE HCL 1 MG/ML IJ SOLN
1.0000 mg | INTRAMUSCULAR | Status: DC | PRN
  Administered 2015-04-21 – 2015-04-25 (×10): 1 mg via INTRAVENOUS
  Filled 2015-04-21: qty 1
  Filled 2015-04-21: qty 2
  Filled 2015-04-21 (×9): qty 1

## 2015-04-21 MED ORDER — IPRATROPIUM-ALBUTEROL 0.5-2.5 (3) MG/3ML IN SOLN
RESPIRATORY_TRACT | Status: AC
  Administered 2015-04-21: 3 mL
  Filled 2015-04-21: qty 3

## 2015-04-21 MED ORDER — NITROGLYCERIN 0.2 MG/ML ON CALL CATH LAB
INTRAVENOUS | Status: DC | PRN
  Administered 2015-04-21 (×2): 80 ug via INTRAVENOUS

## 2015-04-21 MED ORDER — FAMOTIDINE IN NACL 20-0.9 MG/50ML-% IV SOLN
20.0000 mg | Freq: Two times a day (BID) | INTRAVENOUS | Status: DC
  Administered 2015-04-21: 20 mg via INTRAVENOUS

## 2015-04-21 MED ORDER — THROMBIN 20000 UNITS EX SOLR
CUTANEOUS | Status: AC
  Filled 2015-04-21: qty 20000

## 2015-04-21 MED ORDER — LACTATED RINGERS IV SOLN: INTRAVENOUS | Status: DC

## 2015-04-21 MED ORDER — DEXMEDETOMIDINE HCL IN NACL 200 MCG/50ML IV SOLN
0.0000 ug/kg/h | INTRAVENOUS | Status: DC
  Administered 2015-04-21: 0.5 ug/kg/h via INTRAVENOUS
  Filled 2015-04-21: qty 50

## 2015-04-21 MED ORDER — LACTATED RINGERS IV SOLN
INTRAVENOUS | Status: DC
  Administered 2015-04-22: 17:00:00 via INTRAVENOUS

## 2015-04-21 MED ORDER — FENTANYL CITRATE (PF) 250 MCG/5ML IJ SOLN
INTRAMUSCULAR | Status: AC
  Filled 2015-04-21: qty 5

## 2015-04-21 MED ORDER — PANTOPRAZOLE SODIUM 40 MG PO TBEC
40.0000 mg | DELAYED_RELEASE_TABLET | Freq: Every day | ORAL | Status: DC
Start: 2015-04-23 — End: 2015-04-25
  Administered 2015-04-23 – 2015-04-25 (×3): 40 mg via ORAL
  Filled 2015-04-21 (×3): qty 1

## 2015-04-21 MED ORDER — METOPROLOL TARTRATE 25 MG/10 ML ORAL SUSPENSION
12.5000 mg | Freq: Two times a day (BID) | ORAL | Status: DC
  Filled 2015-04-21 (×7): qty 5

## 2015-04-21 MED ORDER — ACETAMINOPHEN 650 MG RE SUPP
650.0000 mg | Freq: Once | RECTAL | Status: AC
  Administered 2015-04-21: 650 mg via RECTAL

## 2015-04-21 MED ORDER — IPRATROPIUM-ALBUTEROL 20-100 MCG/ACT IN AERS: 1.0000 | INHALATION_SPRAY | Freq: Four times a day (QID) | RESPIRATORY_TRACT | Status: DC | PRN

## 2015-04-21 MED ORDER — ONDANSETRON HCL 4 MG/2ML IJ SOLN
4.0000 mg | Freq: Four times a day (QID) | INTRAMUSCULAR | Status: DC | PRN
  Administered 2015-04-21 – 2015-04-22 (×2): 4 mg via INTRAVENOUS
  Filled 2015-04-21 (×4): qty 2

## 2015-04-21 MED ORDER — VANCOMYCIN HCL IN DEXTROSE 1-5 GM/200ML-% IV SOLN
1000.0000 mg | Freq: Once | INTRAVENOUS | Status: AC
  Administered 2015-04-21: 1000 mg via INTRAVENOUS
  Filled 2015-04-21: qty 200

## 2015-04-21 MED ORDER — MORPHINE SULFATE (PF) 2 MG/ML IV SOLN
2.0000 mg | INTRAVENOUS | Status: DC | PRN
  Filled 2015-04-21: qty 1

## 2015-04-21 MED ORDER — MIDAZOLAM HCL 10 MG/2ML IJ SOLN
INTRAMUSCULAR | Status: AC
  Filled 2015-04-21: qty 4

## 2015-04-21 MED ORDER — ALBUMIN HUMAN 5 % IV SOLN
250.0000 mL | INTRAVENOUS | Status: AC | PRN
Start: 2015-04-21 — End: 2015-04-22
  Administered 2015-04-21: 250 mL via INTRAVENOUS

## 2015-04-21 MED ORDER — INSULIN REGULAR BOLUS VIA INFUSION
0.0000 [IU] | Freq: Three times a day (TID) | INTRAVENOUS | Status: DC
  Filled 2015-04-21: qty 10

## 2015-04-21 MED ORDER — PROPOFOL 10 MG/ML IV BOLUS
INTRAVENOUS | Status: AC
  Filled 2015-04-21: qty 20

## 2015-04-21 MED ORDER — IPRATROPIUM-ALBUTEROL 0.5-2.5 (3) MG/3ML IN SOLN
3.0000 mL | RESPIRATORY_TRACT | Status: DC | PRN
  Administered 2015-04-22 – 2015-04-27 (×19): 3 mL via RESPIRATORY_TRACT
  Filled 2015-04-21 (×10): qty 3
  Filled 2015-04-21: qty 39
  Filled 2015-04-21 (×3): qty 3
  Filled 2015-04-21: qty 39
  Filled 2015-04-21 (×7): qty 3

## 2015-04-21 MED ORDER — VECURONIUM BROMIDE 10 MG IV SOLR
INTRAVENOUS | Status: DC | PRN
  Administered 2015-04-21 (×2): 3 mg via INTRAVENOUS
  Administered 2015-04-21: 5 mg via INTRAVENOUS
  Administered 2015-04-21: 4 mg via INTRAVENOUS

## 2015-04-21 MED ORDER — AMIODARONE HCL IN DEXTROSE 360-4.14 MG/200ML-% IV SOLN
60.0000 mg/h | INTRAVENOUS | Status: AC
  Administered 2015-04-21: 500 mg/h via INTRAVENOUS
  Filled 2015-04-21: qty 200

## 2015-04-21 MED ORDER — ANTISEPTIC ORAL RINSE SOLUTION (CORINZ)
7.0000 mL | Freq: Four times a day (QID) | OROMUCOSAL | Status: DC
  Administered 2015-04-21 – 2015-04-24 (×11): 7 mL via OROMUCOSAL

## 2015-04-21 MED ORDER — PROTAMINE SULFATE 10 MG/ML IV SOLN
INTRAVENOUS | Status: DC | PRN
  Administered 2015-04-21: 330 mg via INTRAVENOUS

## 2015-04-21 MED ORDER — CHLORHEXIDINE GLUCONATE 0.12% ORAL RINSE (MEDLINE KIT)
15.0000 mL | Freq: Two times a day (BID) | OROMUCOSAL | Status: DC
  Administered 2015-04-21 – 2015-04-28 (×8): 15 mL via OROMUCOSAL

## 2015-04-21 MED ORDER — METOPROLOL TARTRATE 12.5 MG HALF TABLET
12.5000 mg | ORAL_TABLET | Freq: Two times a day (BID) | ORAL | Status: DC
  Administered 2015-04-21 – 2015-04-23 (×4): 12.5 mg via ORAL
  Filled 2015-04-21 (×7): qty 1

## 2015-04-21 MED ORDER — SODIUM CHLORIDE 0.9 % IJ SOLN: 3.0000 mL | INTRAMUSCULAR | Status: DC | PRN

## 2015-04-21 MED ORDER — BISACODYL 10 MG RE SUPP: 10.0000 mg | Freq: Every day | RECTAL | Status: DC

## 2015-04-21 MED ORDER — ACETAMINOPHEN 500 MG PO TABS
1000.0000 mg | ORAL_TABLET | Freq: Four times a day (QID) | ORAL | Status: DC
  Administered 2015-04-22 – 2015-04-25 (×14): 1000 mg via ORAL
  Filled 2015-04-21 (×17): qty 2

## 2015-04-21 MED ORDER — THROMBIN 20000 UNITS EX SOLR
OROMUCOSAL | Status: DC | PRN
  Administered 2015-04-21: 12 mL via TOPICAL

## 2015-04-21 MED ORDER — ACETAMINOPHEN 160 MG/5ML PO SOLN
1000.0000 mg | Freq: Four times a day (QID) | ORAL | Status: DC
  Filled 2015-04-21: qty 40

## 2015-04-21 MED ORDER — ROCURONIUM BROMIDE 100 MG/10ML IV SOLN
INTRAVENOUS | Status: DC | PRN
  Administered 2015-04-21: 50 mg via INTRAVENOUS

## 2015-04-21 MED ORDER — ACETAMINOPHEN 160 MG/5ML PO SOLN: 650.0000 mg | Freq: Once | ORAL | Status: AC

## 2015-04-21 MED ORDER — PHENYLEPHRINE HCL 10 MG/ML IJ SOLN
INTRAMUSCULAR | Status: DC | PRN
  Administered 2015-04-21 (×2): 40 ug via INTRAVENOUS

## 2015-04-21 MED ORDER — ALBUMIN HUMAN 5 % IV SOLN
INTRAVENOUS | Status: DC | PRN
  Administered 2015-04-21: 12:00:00 via INTRAVENOUS

## 2015-04-21 MED ORDER — POTASSIUM CHLORIDE 10 MEQ/50ML IV SOLN: 10.0000 meq | INTRAVENOUS | Status: AC

## 2015-04-21 MED ORDER — LIDOCAINE HCL (CARDIAC) 20 MG/ML IV SOLN
INTRAVENOUS | Status: AC
  Filled 2015-04-21: qty 5

## 2015-04-21 MED ORDER — NITROGLYCERIN IN D5W 200-5 MCG/ML-% IV SOLN: 0.0000 ug/min | INTRAVENOUS | Status: DC

## 2015-04-21 MED ORDER — BISACODYL 5 MG PO TBEC
10.0000 mg | DELAYED_RELEASE_TABLET | Freq: Every day | ORAL | Status: DC
  Administered 2015-04-22 – 2015-04-25 (×4): 10 mg via ORAL
  Filled 2015-04-21 (×4): qty 2

## 2015-04-21 MED ORDER — PROPOFOL 10 MG/ML IV BOLUS
INTRAVENOUS | Status: DC | PRN
  Administered 2015-04-21: 30 mg via INTRAVENOUS
  Administered 2015-04-21: 20 mg via INTRAVENOUS
  Administered 2015-04-21: 50 mg via INTRAVENOUS

## 2015-04-21 MED ORDER — PROTAMINE SULFATE 10 MG/ML IV SOLN
INTRAVENOUS | Status: AC
  Filled 2015-04-21: qty 25

## 2015-04-21 MED ORDER — PHENYLEPHRINE HCL 10 MG/ML IJ SOLN
0.0000 ug/min | INTRAVENOUS | Status: DC
  Filled 2015-04-21 (×2): qty 2

## 2015-04-21 MED ORDER — VECURONIUM BROMIDE 10 MG IV SOLR
INTRAVENOUS | Status: AC
  Filled 2015-04-21: qty 10

## 2015-04-21 MED ORDER — HEPARIN SODIUM (PORCINE) 1000 UNIT/ML IJ SOLN
INTRAMUSCULAR | Status: AC
  Filled 2015-04-21: qty 1

## 2015-04-21 MED ORDER — BUDESONIDE-FORMOTEROL FUMARATE 160-4.5 MCG/ACT IN AERO
2.0000 | INHALATION_SPRAY | Freq: Two times a day (BID) | RESPIRATORY_TRACT | Status: DC
  Administered 2015-04-22 – 2015-04-27 (×10): 2 via RESPIRATORY_TRACT
  Filled 2015-04-21 (×3): qty 6

## 2015-04-21 MED ORDER — ASPIRIN EC 325 MG PO TBEC
325.0000 mg | DELAYED_RELEASE_TABLET | Freq: Every day | ORAL | Status: DC
Start: 2015-04-22 — End: 2015-04-23
  Administered 2015-04-22: 325 mg via ORAL
  Filled 2015-04-21 (×2): qty 1

## 2015-04-21 MED ORDER — HEMOSTATIC AGENTS (NO CHARGE) OPTIME
TOPICAL | Status: DC | PRN
  Administered 2015-04-21: 1 via TOPICAL

## 2015-04-21 MED ORDER — ASPIRIN 81 MG PO CHEW: 324.0000 mg | CHEWABLE_TABLET | Freq: Every day | ORAL | Status: DC

## 2015-04-21 MED ORDER — DEXTROSE 5 % IV SOLN
1.5000 g | Freq: Two times a day (BID) | INTRAVENOUS | Status: AC
  Administered 2015-04-21 – 2015-04-23 (×4): 1.5 g via INTRAVENOUS
  Filled 2015-04-21 (×4): qty 1.5

## 2015-04-21 MED ORDER — AMIODARONE HCL 200 MG PO TABS
200.0000 mg | ORAL_TABLET | Freq: Every day | ORAL | Status: DC
  Administered 2015-04-22 – 2015-04-27 (×6): 200 mg via ORAL
  Filled 2015-04-21 (×6): qty 1

## 2015-04-21 MED ORDER — METOPROLOL TARTRATE 1 MG/ML IV SOLN
2.5000 mg | INTRAVENOUS | Status: DC | PRN
  Administered 2015-04-23 – 2015-04-24 (×2): 5 mg via INTRAVENOUS
  Filled 2015-04-21 (×2): qty 5

## 2015-04-21 MED ORDER — ATORVASTATIN CALCIUM 40 MG PO TABS
40.0000 mg | ORAL_TABLET | Freq: Every day | ORAL | Status: DC
  Administered 2015-04-22 – 2015-04-27 (×6): 40 mg via ORAL
  Filled 2015-04-21 (×6): qty 1

## 2015-04-21 MED ORDER — OXYCODONE HCL 5 MG PO TABS
5.0000 mg | ORAL_TABLET | ORAL | Status: DC | PRN
  Administered 2015-04-21 – 2015-04-24 (×14): 10 mg via ORAL
  Filled 2015-04-21 (×15): qty 2

## 2015-04-21 MED ORDER — LACTATED RINGERS IV SOLN: 500.0000 mL | Freq: Once | INTRAVENOUS | Status: DC | PRN

## 2015-04-21 MED ORDER — DOCUSATE SODIUM 100 MG PO CAPS
200.0000 mg | ORAL_CAPSULE | Freq: Every day | ORAL | Status: DC
  Administered 2015-04-22 – 2015-04-25 (×4): 200 mg via ORAL
  Filled 2015-04-21 (×4): qty 2

## 2015-04-21 MED ORDER — MAGNESIUM SULFATE 4 GM/100ML IV SOLN
4.0000 g | Freq: Once | INTRAVENOUS | Status: AC
  Administered 2015-04-21: 4 g via INTRAVENOUS
  Filled 2015-04-21: qty 100

## 2015-04-21 MED ORDER — ROCURONIUM BROMIDE 50 MG/5ML IV SOLN
INTRAVENOUS | Status: AC
  Filled 2015-04-21: qty 1

## 2015-04-21 MED ORDER — SODIUM CHLORIDE 0.9 % IV SOLN
INTRAVENOUS | Status: DC | PRN
  Administered 2015-04-21: 11:00:00 via INTRAVENOUS

## 2015-04-21 MED ORDER — FENTANYL CITRATE (PF) 100 MCG/2ML IJ SOLN
INTRAMUSCULAR | Status: DC | PRN
  Administered 2015-04-21: 150 ug via INTRAVENOUS
  Administered 2015-04-21 (×2): 250 ug via INTRAVENOUS
  Administered 2015-04-21: 350 ug via INTRAVENOUS
  Administered 2015-04-21: 50 ug via INTRAVENOUS
  Administered 2015-04-21: 300 ug via INTRAVENOUS
  Administered 2015-04-21: 100 ug via INTRAVENOUS
  Administered 2015-04-21: 50 ug via INTRAVENOUS

## 2015-04-21 MED FILL — Lidocaine HCl IV Inj 20 MG/ML: INTRAVENOUS | Qty: 5 | Status: AC

## 2015-04-21 MED FILL — Mannitol IV Soln 20%: INTRAVENOUS | Qty: 500 | Status: AC

## 2015-04-21 MED FILL — Heparin Sodium (Porcine) Inj 1000 Unit/ML: INTRAMUSCULAR | Qty: 30 | Status: AC

## 2015-04-21 MED FILL — Sodium Chloride IV Soln 0.9%: INTRAVENOUS | Qty: 2000 | Status: AC

## 2015-04-21 MED FILL — Heparin Sodium (Porcine) Inj 1000 Unit/ML: INTRAMUSCULAR | Qty: 10 | Status: AC

## 2015-04-21 MED FILL — Electrolyte-R (PH 7.4) Solution: INTRAVENOUS | Qty: 4000 | Status: AC

## 2015-04-21 MED FILL — Sodium Bicarbonate IV Soln 8.4%: INTRAVENOUS | Qty: 50 | Status: AC

## 2015-04-21 SURGICAL SUPPLY — 92 items
ADAPTER CARDIO PERF ANTE/RETRO (ADAPTER) ×3 IMPLANT
ADPR PRFSN 84XANTGRD RTRGD (ADAPTER) ×2
APL SKNCLS STERI-STRIP NONHPOA (GAUZE/BANDAGES/DRESSINGS) ×2
BAG DECANTER FOR FLEXI CONT (MISCELLANEOUS) ×3 IMPLANT
BENZOIN TINCTURE PRP APPL 2/3 (GAUZE/BANDAGES/DRESSINGS) ×1 IMPLANT
BLADE STERNUM SYSTEM 6 (BLADE) ×3 IMPLANT
BLADE SURG 15 STRL LF DISP TIS (BLADE) ×2 IMPLANT
BLADE SURG 15 STRL SS (BLADE) ×3
CANISTER SUCTION 2500CC (MISCELLANEOUS) ×3 IMPLANT
CANNULA ARTERIAL NVNT 3/8 20FR (MISCELLANEOUS) ×1 IMPLANT
CANNULA ARTERIAL VENT 3/8 20FR (CANNULA) ×1 IMPLANT
CANNULA GUNDRY RCSP 15FR (MISCELLANEOUS) ×3 IMPLANT
CATH ROBINSON RED A/P 18FR (CATHETERS) ×9 IMPLANT
CATH THORACIC 36FR (CATHETERS) ×3 IMPLANT
CATH THORACIC 36FR RT ANG (CATHETERS) ×3 IMPLANT
CONT SPEC 4OZ CLIKSEAL STRL BL (MISCELLANEOUS) ×1 IMPLANT
CONT SPEC STER OR (MISCELLANEOUS) ×3 IMPLANT
COVER SURGICAL LIGHT HANDLE (MISCELLANEOUS) ×3 IMPLANT
CRADLE DONUT ADULT HEAD (MISCELLANEOUS) ×3 IMPLANT
DRAPE SLUSH/WARMER DISC (DRAPES) ×3 IMPLANT
DRSG AQUACEL AG ADV 3.5X14 (GAUZE/BANDAGES/DRESSINGS) ×1 IMPLANT
DRSG COVADERM 4X14 (GAUZE/BANDAGES/DRESSINGS) ×3 IMPLANT
ELECT CAUTERY BLADE 6.4 (BLADE) ×4 IMPLANT
ELECT REM PT RETURN 9FT ADLT (ELECTROSURGICAL) ×6
ELECTRODE REM PT RTRN 9FT ADLT (ELECTROSURGICAL) ×4 IMPLANT
GAUZE SPONGE 4X4 12PLY STRL (GAUZE/BANDAGES/DRESSINGS) ×3 IMPLANT
GLOVE BIO SURGEON STRL SZ 6 (GLOVE) IMPLANT
GLOVE BIO SURGEON STRL SZ 6.5 (GLOVE) ×1 IMPLANT
GLOVE BIO SURGEON STRL SZ7 (GLOVE) IMPLANT
GLOVE BIO SURGEON STRL SZ7.5 (GLOVE) ×1 IMPLANT
GLOVE BIOGEL PI IND STRL 6.5 (GLOVE) IMPLANT
GLOVE BIOGEL PI IND STRL 7.0 (GLOVE) IMPLANT
GLOVE BIOGEL PI IND STRL 8.5 (GLOVE) IMPLANT
GLOVE BIOGEL PI INDICATOR 6.5 (GLOVE) ×1
GLOVE BIOGEL PI INDICATOR 7.0 (GLOVE) ×4
GLOVE BIOGEL PI INDICATOR 8.5 (GLOVE) ×1
GLOVE EUDERMIC 7 POWDERFREE (GLOVE) ×6 IMPLANT
GOWN STRL REUS W/ TWL LRG LVL3 (GOWN DISPOSABLE) ×8 IMPLANT
GOWN STRL REUS W/ TWL XL LVL3 (GOWN DISPOSABLE) ×2 IMPLANT
GOWN STRL REUS W/TWL LRG LVL3 (GOWN DISPOSABLE) ×21
GOWN STRL REUS W/TWL XL LVL3 (GOWN DISPOSABLE) ×3
HEART VENT LT CURVED (MISCELLANEOUS) ×3 IMPLANT
HEMOSTAT POWDER SURGIFOAM 1G (HEMOSTASIS) ×9 IMPLANT
HEMOSTAT SURGICEL 2X14 (HEMOSTASIS) ×3 IMPLANT
KIT BASIN OR (CUSTOM PROCEDURE TRAY) ×3 IMPLANT
KIT CATH CPB BARTLE (MISCELLANEOUS) ×3 IMPLANT
KIT ROOM TURNOVER OR (KITS) ×3 IMPLANT
KIT SUCTION CATH 14FR (SUCTIONS) ×3 IMPLANT
LINE VENT (MISCELLANEOUS) ×1 IMPLANT
NS IRRIG 1000ML POUR BTL (IV SOLUTION) ×18 IMPLANT
PACK OPEN HEART (CUSTOM PROCEDURE TRAY) ×3 IMPLANT
PAD ARMBOARD 7.5X6 YLW CONV (MISCELLANEOUS) ×6 IMPLANT
SET CARDIOPLEGIA MPS 5001102 (MISCELLANEOUS) ×1 IMPLANT
SPONGE GAUZE 4X4 12PLY STER LF (GAUZE/BANDAGES/DRESSINGS) ×1 IMPLANT
SPONGE LAP 18X18 X RAY DECT (DISPOSABLE) ×1 IMPLANT
SUT BONE WAX W31G (SUTURE) ×3 IMPLANT
SUT ETHIBON 2 0 V 52N 30 (SUTURE) ×6 IMPLANT
SUT ETHIBOND 2 0 SH (SUTURE) ×15
SUT ETHIBOND 2 0 SH 36X2 (SUTURE) IMPLANT
SUT PROLENE 3 0 SH DA (SUTURE) ×1 IMPLANT
SUT PROLENE 3 0 SH1 36 (SUTURE) ×3 IMPLANT
SUT PROLENE 4 0 RB 1 (SUTURE) ×18
SUT PROLENE 4-0 RB1 .5 CRCL 36 (SUTURE) ×6 IMPLANT
SUT PROLENE 5 0 C 1 36 (SUTURE) ×2 IMPLANT
SUT PROLENE 6 0 C 1 30 (SUTURE) ×2 IMPLANT
SUT PROLENE 8 0 BV175 6 (SUTURE) ×2 IMPLANT
SUT SILK  1 MH (SUTURE) ×3
SUT SILK 1 MH (SUTURE) IMPLANT
SUT SILK 1 TIES 10X30 (SUTURE) ×1 IMPLANT
SUT SILK 2 0 SH CR/8 (SUTURE) ×2 IMPLANT
SUT SILK 2 0 TIES 10X30 (SUTURE) ×1 IMPLANT
SUT SILK 2 0 TIES 17X18 (SUTURE) ×3
SUT SILK 2-0 18XBRD TIE BLK (SUTURE) IMPLANT
SUT SILK 3 0 SH CR/8 (SUTURE) ×1 IMPLANT
SUT SILK 4 0 TIE 10X30 (SUTURE) ×2 IMPLANT
SUT STEEL 6MS V (SUTURE) IMPLANT
SUT STEEL SZ 6 DBL 3X14 BALL (SUTURE) ×3 IMPLANT
SUT TEM PAC WIRE 2 0 SH (SUTURE) ×4 IMPLANT
SUT VIC AB 1 CTX 36 (SUTURE) ×6
SUT VIC AB 1 CTX36XBRD ANBCTR (SUTURE) ×4 IMPLANT
SUT VIC AB 2-0 CTX 27 (SUTURE) ×2 IMPLANT
SUT VIC AB 3-0 X1 27 (SUTURE) ×2 IMPLANT
SUTURE E-PAK OPEN HEART (SUTURE) ×3 IMPLANT
SYSTEM SAHARA CHEST DRAIN ATS (WOUND CARE) ×3 IMPLANT
TAPE CLOTH SURG 4X10 WHT LF (GAUZE/BANDAGES/DRESSINGS) ×1 IMPLANT
TAPE PAPER 2X10 WHT MICROPORE (GAUZE/BANDAGES/DRESSINGS) ×1 IMPLANT
TOWEL OR 17X24 6PK STRL BLUE (TOWEL DISPOSABLE) ×6 IMPLANT
TOWEL OR 17X26 10 PK STRL BLUE (TOWEL DISPOSABLE) ×6 IMPLANT
TRAY FOLEY IC TEMP SENS 16FR (CATHETERS) ×3 IMPLANT
UNDERPAD 30X30 INCONTINENT (UNDERPADS AND DIAPERS) ×3 IMPLANT
VALVE MAGNA EASE AORTIC 23MM (Prosthesis & Implant Heart) ×1 IMPLANT
WATER STERILE IRR 1000ML POUR (IV SOLUTION) ×6 IMPLANT

## 2015-04-21 NOTE — Anesthesia Procedure Notes (Addendum)
 Procedure Name: Intubation Date/Time:  8:02 AM Performed by: Roney MansSMITH, Rafaelita Foister P Pre-anesthesia Checklist: Patient identified, Timeout performed, Emergency Drugs available, Suction available and Patient being monitored Patient Re-evaluated:Patient Re-evaluated prior to inductionOxygen Delivery Method: Circle system utilized Preoxygenation: Pre-oxygenation with 100% oxygen Intubation Type: IV induction Ventilation: Mask ventilation without difficulty and Oral airway inserted - appropriate to patient size Laryngoscope Size: Mac and 3 Grade View: Grade I Tube type: Oral Tube size: 8.0 mm Number of attempts: 1 Airway Equipment and Method: Stylet and Oral airway Placement Confirmation: ETT inserted through vocal cords under direct vision,  breath sounds checked- equal and bilateral and positive ETCO2 Secured at: 23 cm Tube secured with: Tape Dental Injury: Teeth and Oropharynx as per pre-operative assessment     Procedures: Right IJ Theone MurdochSwan Ganz Catheter Insertion: [id number]: The patient was identified and consent obtained.  TO was performed, and full barrier precautions were used.  The skin was anesthetized with lidocaine-4cc plain with 25g needle.  Once the vein was located with the 22 ga. needle using ultrasound guidance , the wire was inserted into the vein.  The wire location was confirmed with ultrasound.  The tissue was dilated and the 8.5 [place] cordis catheter was carefully inserted. Afterwards Theone MurdochSwan Ganz catheter was inserted. PA catheter at 48cm.  The patient tolerated the procedure well.

## 2015-04-21 NOTE — OR Nursing (Signed)
Skin closure call to SICU @ 1147 and out of room call @ 12:20       Allesha Aronoff,RN

## 2015-04-21 NOTE — Procedures (Signed)
Extubation Procedure Note  Patient Details:   Name: Donald ReichertLarry E [name] DOB: 1961/06/20 MRN: [id number]  Pt extubated to 2L [place] per protocol. VS, NIF, VC all WNL. Pt able to vocalize, no stridor noted. Pt given Duoneb HHN per per request. Pt tolerating well at this time, RT will continue to monitor.  Evaluation  O2 sats: stable throughout Complications: No apparent complications Patient did tolerate procedure well. Bilateral Breath Sounds: Diminished   Yes  Harley HallmarkKeen, Emnet Monk Lyman 04/11/2015, 5:20 PM

## 2015-04-21 NOTE — Anesthesia Preprocedure Evaluation (Addendum)
Anesthesia Evaluation  Patient identified by MRN, date of birth, ID band Patient awake    Reviewed: Allergy & Precautions, NPO status , Patient's Chart, lab work & pertinent test results, reviewed documented beta blocker date and time   History of Anesthesia Complications Negative for: history of anesthetic complications  Airway Mallampati: II  TM Distance: >3 FB Neck ROM: Full    Dental  (+) Edentulous Upper, Dental Advisory Given   Pulmonary shortness of breath, sleep apnea , COPD, Current Smoker,    Pulmonary exam normal breath sounds clear to auscultation       Cardiovascular hypertension, Pt. on medications and Pt. on home beta blockers + Past MI  + dysrhythmias Atrial Fibrillation + Valvular Problems/Murmurs AS  Rhythm:Regular Rate:Normal + Systolic murmurs Mod-severe AS. EF 50-55%   Neuro/Psych TIACVA    GI/Hepatic Neg liver ROS, GERD  ,  Endo/Other  negative endocrine ROS  Renal/GU negative Renal ROS     Musculoskeletal   Abdominal   Peds  Hematology negative hematology ROS (+)   Anesthesia Other Findings   Reproductive/Obstetrics                            Anesthesia Physical Anesthesia Plan  ASA: IV  Anesthesia Plan: General   Post-op Pain Management:    Induction: Intravenous  Airway Management Planned: Oral ETT  Additional Equipment: Arterial line, Ultrasound Guidance Line Placement, CVP, PA Cath and TEE  Intra-op Plan:   Post-operative Plan: Post-operative intubation/ventilation  Informed Consent: I have reviewed the patients History and Physical, chart, labs and discussed the procedure including the risks, benefits and alternatives for the proposed anesthesia with the patient or authorized representative who has indicated his/her understanding and acceptance.   Dental advisory given  Plan Discussed with: Surgeon, Anesthesiologist and CRNA  Anesthesia Plan  Comments:         Anesthesia Quick Evaluation

## 2015-04-21 NOTE — OR Nursing (Signed)
Off-pump call and retractor-out call to SICU at 1105 and 1114.

## 2015-04-21 NOTE — Op Note (Signed)
CARDIOVASCULAR SURGERY OPERATIVE NOTE  04/29/15 JERIC SLAGEL [id number]  Surgeon:  Alleen Borne, MD  First Assistant: Gershon Crane,  PA-C   Preoperative Diagnosis:  Severe aortic stenosis   Postoperative Diagnosis:  Same   Procedure:  1. Median Sternotomy 2. Extracorporeal circulation 3.   Aortic valve replacement using a 23 mm  Edwards Magna-Ease pericardial valve.  Anesthesia:  General Endotracheal   Clinical History/Surgical Indication:  The patient is a 54 year old gentleman with a long history of smoking, prior ETOH abuse in [place], crack cocaine abuse, admitted in August with new onset of left facial droop and weakness, slurred speech, left arm weakness and numbness. It lasted about 20 seconds and resolved. CT of the brain in the ED was negative. MRA of the head showed mild intracranial atherosclerotic changes without significant vessel occlusion and no acute infarct. Carotid dopplers normal. He also has a history of pulmonary embolism treated at [facility] 2 months ago and says that he was on Xarelto for this as well as chronic atrial fibrillation on amiodarone but he moved and lost his medication so he was not taking the Xarelto. He was apparently in sinus rhythm when seen in clinic 12/17/2014. He has a history of known aortic stenosis followed by Dr. Dot Been at [facility]. He was admitted to [facility] from 8/4-8/6 for acute encephalopathy felt to be due to polysubstance abuse with positive UDS for benzos and cocaine. His troponin was positive at 0.18>0.20. An echo on 02/03/2015 showed a mean AV gradient of 52 mm Hg with a peak of 81 mm Hg. The valve appeared trileaflet, calcified with poor leaflet mobility. The EF was 55%.   At the time of this admission his troponin POC was 0.29 with a Troponin I of 0.23. A repeat echo was performed on 02/27/2015 with poor visualization of the AV and a mean gradient of only 35 mm Hg. He underwent cath on 02/18/2015 that showed normal coronaries with a  mean gradient of 47 mm Hg and a peak of 57 mm Hg with a CO of 5.7 L/min.   He was seen by dentistry and underwent multiple extractions for poor dentition on 03/09/2015. I saw him back in the office on 03/22/2015 and he was healing well and back to work. He has continued to smoke. I scheduled him for AVR on 04/14/2015 but he returned to see his primary physician, Dr. Reche Dixon, just prior to that who thought that he was having a flare of bronchitis and that we should delay surgery. He has been treated and returned to see Dr. Reche Dixon who thought that he was ready for surgery.  He has stage D severe symptomatic aortic stenosis with normal coronaries and LV function. I have personally reviewed his cath and echos. 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 This valve requires replacement. He is only 54 years old but has a long history of polysubstance abuse and noncompliance and is essentially homeless so I think a tissue valve would be the best option for him. I discussed this with him including the risk of structural valve deterioration requiring prosthetic valve replacement and he understands and agrees. The etiology of his TIA is unclear. He has been in sinus rhythm on ECG done in early August and during this admission. There was no intracardiac clot seen on echo. He could have flicked something off his valve or aorta and he does have some intracranial atherosclerosis. With a prosthetic valve in place he would  have to be on coumadin or aspirin or aspirin/Plavix. I discussed the operative procedure of AVR using a tissue valve with the patient including alternatives, benefits and risks; including but not limited to bleeding, blood transfusion, infection, stroke, myocardial infarction, structural valve deterioration requiring replacement, heart block requiring a permanent pacemaker, organ dysfunction, and death. Greggory KeenLarry E Lovick understands and agrees to proceed.     Preparation:  The patient was seen in the preoperative holding area and the correct patient, correct operation were confirmed with the patient after reviewing the medical record and catheterization. The consent was signed by me. Preoperative antibiotics were given. A pulmonary arterial line and radial arterial line were placed by the anesthesia team. The patient was taken back to the operating room and positioned supine on the operating room table. After being placed under general endotracheal anesthesia by the anesthesia team a foley catheter was placed. The neck, chest, abdomen, and both legs were prepped with betadine soap and solution and draped in the usual sterile manner. A surgical time-out was taken and the correct patient and operative procedure were confirmed with the nursing and anesthesia staff.   Pre-bypass TEE:   Complete TEE assessment was performed by Dr. Corky Soxhris Moser. This showed a true bicuspid aortic valve with calcified and poorly mobile leaflets, severe AS. Moderate LVH with EF 50%. No MR.    Post-bypass TEE:   Normal functioning prosthetic aortic valve with no perivalvular leak or regurgitation through the valve. Left ventricular function preserved. No mitral regurgitation.    Cardiopulmonary Bypass:  A median sternotomy was performed. The pericardium was opened in the midline. Right ventricular function appeared normal. The ascending aorta was of normal size and had no palpable plaque. There were no contraindications to aortic cannulation or cross-clamping. The patient was fully systemically heparinized and the ACT was maintained > 400 sec. The proximal aortic arch was cannulated with a 22 F aortic cannula for arterial inflow. Venous cannulation was performed via the right atrial appendage using a two-staged venous cannula. An antegrade cardioplegia/vent cannula was inserted into the mid-ascending aorta. A left ventricular vent was placed via the right superior  pulmonary vein. A retrograde cardioplegia cannnula was placed into the coronary sinus via the right atrium. Aortic occlusion was performed with a single cross-clamp. Systemic cooling to 32 degrees Centigrade and topical cooling of the heart with iced saline were used. Hyperkalemic antegrade cold blood cardioplegia was used to induce diastolic arrest and then cold blood retrograde cardioplegia was given at about 20 minute intervals throughout the period of arrest to maintain myocardial temperature at or below 10 degrees centigrade. A temperature probe was inserted into the interventricular septum and an insulating pad was placed in the pericardium. Carbon dioxide was insufflated into the pericardium at 5L/min throughout the procedure to minimize intracardiac air.   Aortic Valve Replacement:   A transverse aortotomy was performed 1 cm above the take-off of the right coronary artery. The native valve was a true bicuspid with calcified leaflets and mild annular calcification. The ostia of the coronary arteries were in normal position and were not obstructed. The native valve leaflets were excised and the annulus was decalcified with rongeurs. Care was taken to remove all particulate debris. The left ventricle was directly inspected for debris and then irrigated with ice saline solution. The annulus was sized and a size 23 mm  Edwards Magna-Ease pericardial valve was chosen. The model number was [id number] and the serial number was [id number]. While the valve was being prepared  2-0 Ethibond pledgeted horizontal mattress sutures were placed around the annulus with the pledgets in a sub-annular position. The sutures were placed through the sewing ring and the valve lowered into place. The sutures were tied sequentially. The valve seated nicely and the coronary ostia were not obstructed. The prosthetic valve leaflets moved normally and there was no sub-valvular obstruction. The aortotomy was closed using 4-0 Prolene suture  in 2 layers with felt strips to reinforce the closure.  Completion:  The patient was rewarmed to 37 degrees Centigrade. De-airing maneuvers were performed and the head placed in trendelenburg position. The crossclamp was removed with a time of 87 minutes. There was spontaneous return of sinus rhythm. The aortotomy was checked for hemostasis. Two temporary epicardial pacing wires were placed on the right atrium and two on the right ventricle. The left ventricular vent and retrograde cardioplegia cannulas were removed. The patient was weaned from CPB without difficulty on no inotropes. CPB time was 112 minutes. Cardiac output was 5 LPM. Heparin was fully reversed with protamine and the aortic and venous cannulas removed. Hemostasis was achieved. Mediastinal drainage tubes were placed. The sternum was closed with double #6 stainless steel wires. The fascia was closed with continuous # 1 vicryl suture. The subcutaneous tissue was closed with 2-0 vicryl continuous suture. The skin was closed with 3-0 vicryl subcuticular suture. All sponge, needle, and instrument counts were reported correct at the end of the case. Dry sterile dressings were placed over the incisions and around the chest tubes which were connected to pleurevac suction. The patient was then transported to the surgical intensive care unit in critical but stable condition.

## 2015-04-21 NOTE — Progress Notes (Signed)
Pt took 50 mg PO Lopressor and 200 mg PO Amiodarone which are his home meds at 0620 with a sip of water. 12.5 mg Lopressor which was ordered was not given.

## 2015-04-21 NOTE — Brief Op Note (Addendum)
2015/06/27      [address]       [place] [id number]             [phone]     2015/06/27  10:56 AM  PATIENT:  Donald ReichertLarry E Palleschi  54 y.o. male  PRE-OPERATIVE DIAGNOSIS:  SEVERE AS  POST-OPERATIVE DIAGNOSIS:  severe aortic stenosis  PROCEDURE:  Procedure(s): AORTIC VALVE REPLACEMENT (AVR)#23 MAGNA EASE   TRANSESOPHAGEAL ECHOCARDIOGRAM (TEE)  SURGEON:  Surgeon(s): Alleen BorneBryan K Bartle, MD  PHYSICIAN ASSISTANT: WAYNE GOLD PA-C  ANESTHESIA:   general  PATIENT CONDITION:  ICU - intubated and hemodynamically stable.  PRE-OPERATIVE WEIGHT: 91kg  EBL: SEE ANEST/PERFUSION RECORDS  COMPLICATIONS: NO KNOWN Aortic Valve  Procedure Performed:  Replacement: Yes.  Bioprosthetic Valve. Implant Model Number:[id number], Size:23, Unique Device Identifier:[id number]  Repair/Reconstruction: No.   Aortic Annular Enlargement: No.   Aortic Valve Etiology   Aortic Insufficiency:  Trivial/Trace  Aortic Valve Disease:  Yes.  Aortic Stenosis:  Yes. Smallest Aortic Valve Area: .54cm2; Highest Mean Gradient: 81mmHg.  Etiology (Choose at least one and up to  5 etiologies):  Bicuspid valve disease and Degenerative - Calcified

## 2015-04-21 NOTE — Progress Notes (Signed)
Patient ID: Donald ReichertLarry E Pirani, male   DOB: [birth year], 54 y.o.   MRN: [id number] EVENING ROUNDS NOTE :     [address]       [place] [id number]             [phone]                 Day of Surgery Procedure(s) (LRB): AORTIC VALVE REPLACEMENT (AVR) (N/A) TRANSESOPHAGEAL ECHOCARDIOGRAM (TEE) (N/A)  Total Length of Stay:  LOS: 0 days  BP 138/70 mmHg  Pulse 89  Temp(Src) 99.1 F (37.3 C) (Oral)  Resp 24  Wt 201 lb 14.4 oz (91.581 kg)  SpO2 95%  .Intake/Output      10/21 0701 - 10/22 0700   I.V. (mL/kg) 3354 (36.6)   Blood 530   NG/GT 30   IV Piggyback 900   Total Intake(mL/kg) 4814 (52.6)   Urine (mL/kg/hr) 1955 (1.5)   Emesis/NG output 30 (0)   Other 739 (0.6)   Blood 2139 (1.6)   Chest Tube 260 (0.2)   Total Output 5123   Net -309         . sodium chloride 10 mL/hr at 06-14-15 1230  . [START ON 04/22/2015] sodium chloride    . sodium chloride 1 mL (06-14-15 1230)  . dexmedetomidine Stopped (06-14-15 1550)  . insulin (NOVOLIN-R) infusion 1.2 Units/hr (06-14-15 1800)  . lactated ringers Stopped (06-14-15 1230)  . lactated ringers 1 mL (06-14-15 1230)  . nitroGLYCERIN Stopped (06-14-15 1700)  . phenylephrine (NEO-SYNEPHRINE) Adult infusion Stopped (06-14-15 1310)     Lab Results  Component Value Date   WBC 14.4* August 31, 2014   HGB 11.9* August 31, 2014   HCT 37.1* August 31, 2014   PLT 130* August 31, 2014   GLUCOSE 122* August 31, 2014   CHOL 208* 02/28/2015   TRIG 98 02/28/2015   HDL 44 02/28/2015   LDLCALC 144* 02/28/2015   ALT 22 04/19/2015   AST 18 04/19/2015   NA 136 August 31, 2014   K 4.2 August 31, 2014   CL 105 August 31, 2014   CREATININE 1.02 August 31, 2014   BUN 15 August 31, 2014   CO2 24 04/19/2015   TSH 0.477 02/27/2015   INR 1.53* August 31, 2014   HGBA1C 5.8* 04/19/2015   Extubated, want dilaudid not morphine  Delight OvensEdward B Florene Brill MD  Beeper [phone] Office [phone] August 31, 2014 9:22 PM

## 2015-04-21 NOTE — H&P (Signed)
[address]       [place] [address]             [phone]       Cardiothoracic Surgery Admission History and Physical   Reason for Consult: Severe aortic stenosis Referring Physician: Dr. Shelva Majestic  Donald [name] is an 54 y.o. male.  HPI:   The patient is a 55 year old gentleman with a long history of smoking, prior ETOH abuse in [place], crack cocaine abuse, admitted in August with new onset of left facial droop and weakness, slurred speech, left arm weakness and numbness. It lasted about 20 seconds and resolved. CT of the brain in the ED was negative. MRA of the head showed mild intracranial atherosclerotic changes without significant vessel occlusion and no acute infarct. Carotid dopplers normal. He also has a history of pulmonary embolism treated at [facility] 2 months ago and says that he was on Xarelto for this as well as chronic atrial fibrillation on amiodarone but he moved and lost his medication so he was not taking the Xarelto. He was apparently in sinus rhythm when  seen in clinic 12/17/2014. He has a history of known aortic stenosis followed by Dr. Marijo File at [facility]. He was admitted to [facility] from 8/4-8/6 for acute encephalopathy felt to be due to polysubstance abuse with positive UDS for benzos and cocaine. His troponin was positive at 0.18>0.20. An echo on 02/03/2015 showed a mean AV gradient of 52 mm Hg with a peak of 81 mm Hg. The valve appeared trileaflet, calcified with poor leaflet mobility. The EF was 55%.   At the time of this admission his troponin POC was 0.29 with a Troponin I of 0.23. A repeat echo was performed on 02/27/2015 with poor visualization of the AV and a mean gradient of only 35 mm Hg. He underwent cath on 02/18/2015 that showed normal coronaries with a mean gradient of 47 mm Hg and a peak of 57 mm Hg with a CO of 5.7 L/min.   He was seen by dentistry and underwent multiple extractions for poor dentition on 03/09/2015. I saw him back in  the office on 03/22/2015 and he was healing well and back to work. He has continued to smoke. I scheduled him for AVR on 04/14/2015 but he returned to see his primary physician, Dr. Jobe Igo, just prior to that who thought that he was having a flare of bronchitis and that we should delay surgery. He has been treated and returned to see Dr. Jobe Igo who thought that he was ready for surgery.  Past Medical History  Diagnosis Date  . Substance abuse   . A-fib   . Hypertension   . Arthritis   . COPD (chronic obstructive pulmonary disease)     Past Surgical History  Procedure Laterality Date  . Tonsillectomy    . Cardiac catheterization N/A 02/28/2015    Procedure: Right/Left Heart Cath and Coronary Angiography; Surgeon: Belva Crome, MD; Location: [facility]; Service: Cardiovascular; Laterality: N/A;    Family History  Problem Relation Age of Onset  . Family history unknown: Yes    Social History:  reports that he has been smoking Cigarettes. He has a 88 pack-year smoking history. He does not have any smokeless tobacco history on file. He reports that he drinks alcohol. He reports that he uses illicit drugs (Cocaine) about 7 times per week.  Allergies: No Known Allergies  Medications:  I have  reviewed the patient's current medications. Prior to Admission:  Prescriptions prior to admission  Medication Sig Dispense Refill Last Dose  . albuterol (PROVENTIL) (2.5 MG/3ML) 0.083% nebulizer solution Take 2.5 mg by nebulization every 6 (six) hours as needed for wheezing or shortness of breath.    Past Week at Unknown time  . amiodarone (PACERONE) 200 MG tablet Take 1 tablet (200 mg total) by mouth daily. For Atrial fibrillation/Hypertrophic Cardiomyopathy   Past Week at Unknown time  . budesonide-formoterol (SYMBICORT) 160-4.5 MCG/ACT inhaler Inhale 2 puffs into the lungs 2 (two) times daily.   Past Week at Unknown  time  . COMBIVENT RESPIMAT 20-100 MCG/ACT AERS respimat Take 1 puff by mouth every 6 (six) hours as needed for wheezing or shortness of breath.    Past Week at Unknown time  . digoxin (LANOXIN) 0.25 MG tablet Take 0.25 mg by mouth daily.   Past Week at Unknown time  . XARELTO 20 MG TABS tablet Take 20 mg by mouth daily with supper.    Past Week at Unknown time   Scheduled: . amiodarone 200 mg Oral Daily  . aspirin 81 mg Oral Daily  . atorvastatin 80 mg Oral Q0600  . budesonide (PULMICORT) nebulizer solution 0.25 mg Nebulization BID  . guaiFENesin 600 mg Oral BID  . Influenza vac split quadrivalent PF 0.5 mL Intramuscular Tomorrow-1000  . ipratropium-albuterol 3 mL Nebulization QID  . nicotine 14 mg Transdermal Daily  . sodium chloride 3 mL Intravenous Q12H   Continuous: . heparin 1,600 Units/hr (03/01/15 1555)   EGB:TDVVOH chloride, acetaminophen **OR** acetaminophen, ondansetron (ZOFRAN) IV, sodium chloride Anti-infectives    Start   Dose/Rate Route Frequency Ordered Stop   02/27/15 2200  doxycycline (VIBRA-TABS) tablet 100 mg Status: Discontinued    100 mg Oral Every 12 hours 02/27/15 1545 02/28/15 1144       Lab Results Last 48 Hours    Results for orders placed or performed during the hospital encounter of 02/27/15 (from the past 48 hour(s))  Troponin I (q 6hr x 3) Status: Abnormal   Collection Time: 02/27/15 10:21 PM  Result Value Ref Range   Troponin I 0.10 (H) <0.031 ng/mL    Comment:   PERSISTENTLY INCREASED TROPONIN VALUES IN THE RANGE OF 0.04-0.49 ng/mL CAN BE SEEN IN:  -UNSTABLE ANGINA  -CONGESTIVE HEART FAILURE  -MYOCARDITIS  -CHEST TRAUMA  -ARRYHTHMIAS  -LATE PRESENTING MYOCARDIAL INFARCTION  -COPD  CLINICAL FOLLOW-UP RECOMMENDED.   Heparin level (unfractionated) Status: Abnormal    Collection Time: 02/27/15 10:21 PM  Result Value Ref Range   Heparin Unfractionated <0.10 (L) 0.30 - 0.70 IU/mL    Comment:   IF HEPARIN RESULTS ARE BELOW EXPECTED VALUES, AND PATIENT DOSAGE HAS BEEN CONFIRMED, SUGGEST FOLLOW UP TESTING OF ANTITHROMBIN III LEVELS.   Troponin I (q 6hr x 3) Status: Abnormal   Collection Time: 02/28/15 3:17 AM  Result Value Ref Range   Troponin I 0.10 (H) <0.031 ng/mL    Comment:   PERSISTENTLY INCREASED TROPONIN VALUES IN THE RANGE OF 0.04-0.49 ng/mL CAN BE SEEN IN:  -UNSTABLE ANGINA  -CONGESTIVE HEART FAILURE  -MYOCARDITIS  -CHEST TRAUMA  -ARRYHTHMIAS  -LATE PRESENTING MYOCARDIAL INFARCTION  -COPD  CLINICAL FOLLOW-UP RECOMMENDED.   Hemoglobin A1c Status: Abnormal   Collection Time: 02/28/15 3:17 AM  Result Value Ref Range   Hgb A1c MFr Bld 5.9 (H) 4.8 - 5.6 %    Comment: (NOTE)  Pre-diabetes: 5.7 - 6.4  Diabetes: >6.4  Glycemic control for adults with diabetes: <  7.0    Mean Plasma Glucose 123 mg/dL    Comment: (NOTE) Performed At: [facility] [place], [place] [id number] Lindon Romp MD [id number]   Heparin level (unfractionated) Status: Abnormal   Collection Time: 02/28/15 3:17 AM  Result Value Ref Range   Heparin Unfractionated 0.28 (L) 0.30 - 0.70 IU/mL    Comment:   IF HEPARIN RESULTS ARE BELOW EXPECTED VALUES, AND PATIENT DOSAGE HAS BEEN CONFIRMED, SUGGEST FOLLOW UP TESTING OF ANTITHROMBIN III LEVELS.   CBC Status: None   Collection Time: 02/28/15 3:17 AM  Result Value Ref Range   WBC 6.3 4.0 - 10.5 K/uL   RBC 5.17 4.22 - 5.81 MIL/uL   Hemoglobin 15.2 13.0 - 17.0 g/dL   HCT 45.2 39.0 - 52.0 %   MCV 87.4 78.0 - 100.0 fL   MCH 29.4 26.0 - 34.0 pg   MCHC 33.6 30.0 - 36.0 g/dL   RDW 13.9 11.5 - 15.5  %   Platelets 168 150 - 400 K/uL  Lipid panel Status: Abnormal   Collection Time: 02/28/15 3:17 AM  Result Value Ref Range   Cholesterol 208 (H) 0 - 200 mg/dL   Triglycerides 98 <150 mg/dL   HDL 44 >40 mg/dL   Total CHOL/HDL Ratio 4.7 RATIO   VLDL 20 0 - 40 mg/dL   LDL Cholesterol 144 (H) 0 - 99 mg/dL    Comment:   Total Cholesterol/HDL:CHD Risk Coronary Heart Disease Risk Table  Men Women 1/2 Average Risk 3.4 3.3 Average Risk 5.0 4.4 2 X Average Risk 9.6 7.1 3 X Average Risk 23.4 11.0   Use the calculated Patient Ratio above and the CHD Risk Table to determine the patient's CHD Risk.   ATP III CLASSIFICATION (LDL): <100 mg/dL Optimal 100-129 mg/dL Near or Above  Optimal 130-159 mg/dL Borderline 160-189 mg/dL High >190 mg/dL Very High   Heparin level (unfractionated) Status: None   Collection Time: 02/28/15 11:30 AM  Result Value Ref Range   Heparin Unfractionated 0.50 0.30 - 0.70 IU/mL    Comment:   IF HEPARIN RESULTS ARE BELOW EXPECTED VALUES, AND PATIENT DOSAGE HAS BEEN CONFIRMED, SUGGEST FOLLOW UP TESTING OF ANTITHROMBIN III LEVELS.   Heparin level (unfractionated) Status: None   Collection Time: 02/28/15 4:25 PM  Result Value Ref Range   Heparin Unfractionated 0.37 0.30 - 0.70 IU/mL    Comment:   IF HEPARIN RESULTS ARE BELOW EXPECTED VALUES, AND PATIENT DOSAGE HAS BEEN CONFIRMED, SUGGEST FOLLOW UP TESTING OF ANTITHROMBIN III LEVELS.   I-STAT 3, arterial blood gas (G3+) Status: None   Collection Time: 02/28/15 5:11 PM  Result Value Ref Range   pH, Arterial 7.363 7.350 - 7.450   pCO2 arterial 40.7 35.0 - 45.0 mmHg   pO2, Arterial 94.0 80.0 - 100.0 mmHg   Bicarbonate 23.2 20.0 - 24.0 mEq/L   TCO2 24 0 - 100 mmol/L   O2  Saturation 97.0 %   Acid-base deficit 2.0 0.0 - 2.0 mmol/L   Sample type ARTERIAL   I-STAT 3, venous blood gas (G3P V) Status: Abnormal   Collection Time: 02/28/15 5:15 PM  Result Value Ref Range   pH, Ven 7.357 (H) 7.250 - 7.300   pCO2, Ven 44.2 (L) 45.0 - 50.0 mmHg   pO2, Ven 41.0 30.0 - 45.0 mmHg   Bicarbonate 24.8 (H) 20.0 - 24.0 mEq/L   TCO2 26 0 - 100 mmol/L   O2 Saturation 73.0 %   Acid-base deficit 1.0 0.0 - 2.0 mmol/L  Sample type VENOUS   CBC Status: None   Collection Time: 03/01/15 4:20 AM  Result Value Ref Range   WBC 8.6 4.0 - 10.5 K/uL   RBC 4.77 4.22 - 5.81 MIL/uL   Hemoglobin 13.9 13.0 - 17.0 g/dL   HCT 42.2 39.0 - 52.0 %   MCV 88.5 78.0 - 100.0 fL   MCH 29.1 26.0 - 34.0 pg   MCHC 32.9 30.0 - 36.0 g/dL   RDW 14.3 11.5 - 15.5 %   Platelets 162 150 - 400 K/uL  Basic metabolic panel Status: Abnormal   Collection Time: 03/01/15 4:20 AM  Result Value Ref Range   Sodium 140 135 - 145 mmol/L   Potassium 4.2 3.5 - 5.1 mmol/L   Chloride 107 101 - 111 mmol/L   CO2 26 22 - 32 mmol/L   Glucose, Bld 98 65 - 99 mg/dL   BUN 15 6 - 20 mg/dL   Creatinine, Ser 1.04 0.61 - 1.24 mg/dL   Calcium 8.6 (L) 8.9 - 10.3 mg/dL   GFR calc non Af Amer >60 >60 mL/min   GFR calc Af Amer >60 >60 mL/min    Comment: (NOTE) The eGFR has been calculated using the CKD EPI equation. This calculation has not been validated in all clinical situations. eGFR's persistently <60 mL/min signify possible Chronic Kidney Disease.    Anion gap 7 5 - 15  Heparin level (unfractionated) Status: None   Collection Time: 03/01/15 9:37 AM  Result Value Ref Range   Heparin Unfractionated 0.32 0.30 - 0.70 IU/mL    Comment:   IF HEPARIN RESULTS ARE BELOW EXPECTED VALUES, AND PATIENT DOSAGE HAS BEEN CONFIRMED, SUGGEST FOLLOW  UP TESTING OF ANTITHROMBIN III LEVELS.        Imaging Results (Last 48 hours)    Mr Virgel Paling Wo Contrast  02/27/2015 CLINICAL DATA: 54 year old hypertensive male with atrial fibrillation and history of substance abuse presenting with slurred speech and left arm numbness. Subsequent encounter. EXAM: MRI HEAD WITHOUT CONTRAST MRA HEAD WITHOUT CONTRAST TECHNIQUE: Multiplanar, multiecho pulse sequences of the brain and surrounding structures were obtained without intravenous contrast. Angiographic images of the head were obtained using MRA technique without contrast. COMPARISON: 02/27/2015 head CT. No comparison brain MR. FINDINGS: MRI HEAD FINDINGS No acute infarct. No intracranial hemorrhage. Tiny area of encephalomalacia posterior medial left parietal-occipital lobe. No hydrocephalus. No intracranial mass lesion noted on this unenhanced exam. Degenerative changes right C1- occiput articulation with fluid extending away from the joint. Partially empty non expanded sella. Cervical medullary junction pineal region unremarkable. Orbital structures within normal limits. MRA HEAD FINDINGS Anterior circulation without medium or large size vessel significant stenosis or occlusion. Mild narrowing right middle cerebral artery bifurcation. Mild middle cerebral artery branch vessel irregularity bilaterally. Codominant vertebral arteries without high-grade stenosis. Mild irregularity basilar artery without high-grade stenosis. Nonvisualized anterior inferior cerebellar arteries. Mild superior cerebellar arteries. Mild posterior cerebral artery proximal and distal branch vessel irregularity and narrowing. IMPRESSION: MRI HEAD FINDINGS No acute infarct. Tiny area of encephalomalacia posterior medial left parietal-occipital lobe. Degenerative changes right C1- occiput articulation with fluid extending away from the joint. MRA HEAD FINDINGS Mild intracranial atherosclerotic type changes without  medium or large size vessel significant stenosis occlusion. Electronically Signed By: Genia Del M.D. On: 02/27/2015 21:16   Mr Brain Wo Contrast  02/27/2015 CLINICAL DATA: 54 year old hypertensive male with atrial fibrillation and history of substance abuse presenting with slurred speech and left arm numbness. Subsequent encounter. EXAM: MRI HEAD WITHOUT CONTRAST MRA  HEAD WITHOUT CONTRAST TECHNIQUE: Multiplanar, multiecho pulse sequences of the brain and surrounding structures were obtained without intravenous contrast. Angiographic images of the head were obtained using MRA technique without contrast. COMPARISON: 02/27/2015 head CT. No comparison brain MR. FINDINGS: MRI HEAD FINDINGS No acute infarct. No intracranial hemorrhage. Tiny area of encephalomalacia posterior medial left parietal-occipital lobe. No hydrocephalus. No intracranial mass lesion noted on this unenhanced exam. Degenerative changes right C1- occiput articulation with fluid extending away from the joint. Partially empty non expanded sella. Cervical medullary junction pineal region unremarkable. Orbital structures within normal limits. MRA HEAD FINDINGS Anterior circulation without medium or large size vessel significant stenosis or occlusion. Mild narrowing right middle cerebral artery bifurcation. Mild middle cerebral artery branch vessel irregularity bilaterally. Codominant vertebral arteries without high-grade stenosis. Mild irregularity basilar artery without high-grade stenosis. Nonvisualized anterior inferior cerebellar arteries. Mild superior cerebellar arteries. Mild posterior cerebral artery proximal and distal branch vessel irregularity and narrowing. IMPRESSION: MRI HEAD FINDINGS No acute infarct. Tiny area of encephalomalacia posterior medial left parietal-occipital lobe. Degenerative changes right C1- occiput articulation with fluid extending away from the joint. MRA HEAD FINDINGS Mild  intracranial atherosclerotic type changes without medium or large size vessel significant stenosis occlusion. Electronically Signed By: Genia Del M.D. On: 02/27/2015 21:16     Review of Systems  Constitutional: Positive for malaise/fatigue. Negative for fever and chills.  HENT:   Bad teeth  Eyes: Negative.  Respiratory: Positive for shortness of breath. Negative for cough, hemoptysis and sputum production.  Cardiovascular: Positive for chest pain. Negative for orthopnea, leg swelling and PND.  Gastrointestinal: Negative.  Genitourinary: Negative.  Musculoskeletal: Negative.  Skin: Negative.  Neurological: Positive for dizziness.   Transient left facial weakness, slurred speech and left upper extremity weakness and numbness.  Endo/Heme/Allergies: Negative.  Psychiatric/Behavioral: Positive for substance abuse.   Blood pressure 142/90, pulse 81, temperature 97.8 F (36.6 C), temperature source Oral, resp. rate 25, height 5' 11"  (1.803 m), weight 94.53 kg (208 lb 6.4 oz), SpO2 97 %. Physical Exam  Constitutional: He is oriented to person, place, and time. He appears well-developed and well-nourished. He appears distressed.  HENT:  Head: Normocephalic and atraumatic.  Poor dentition with multiple missing upper teeth.  Eyes: EOM are normal. Pupils are equal, round, and reactive to light.  Neck: Normal range of motion. Neck supple. No JVD present. No thyromegaly present.  Cardiovascular: Normal rate, regular rhythm and intact distal pulses.  Murmur heard. 3/6 systolic murmur along RSB  Respiratory: Effort normal and breath sounds normal. No respiratory distress. He has no rales.  GI: Soft. Bowel sounds are normal. He exhibits no distension and no mass. There is no tenderness.  Midline diastasis recti  Musculoskeletal: Normal range of motion. He exhibits no edema or tenderness.  Lymphadenopathy:   He has no cervical adenopathy.  Neurological: He is alert  and oriented to person, place, and time. He has normal strength. No cranial nerve deficit or sensory deficit.  Skin: Skin is warm and dry.  Psychiatric: He has a normal mood and affect.                 *[facility] [employer].            [place], [place] [address]              [phone]  ------------------------------------------------------------------- Transthoracic  Echocardiography  Patient:  Alen, Matheson MR #:    [id number] Study Date: 02/03/2015 Gender:   M Age:    23 Height:   180.3 cm Weight:   90.3 kg BSA:    2.14 m^2 Pt. Status: Room:    1429  ADMITTING  Linward Foster REFERRING  Rise Patience SONOGRAPHER [address] ATTENDING  Tanna Furry [id number] PERFORMING  [facility], Inpatient  cc:  ------------------------------------------------------------------- LV EF: 55%  ------------------------------------------------------------------- Indications:   Dyspnea 786.09.  ------------------------------------------------------------------- History:  PMH: Aortic Stenosis, Cocaine Abuse, History of Pulmonary Embolism, ETOH. Atrial fibrillation. Angina pectoris. Risk factors: Current tobacco use.  ------------------------------------------------------------------- Study Conclusions  - Left ventricle: The cavity size was normal. Wall thickness was increased in a pattern of mild LVH. The estimated ejection fraction was 55%. Although no diagnostic regional wall motion abnormality was identified, this possibility cannot be completely excluded on the basis of this study. Doppler parameters are consistent with abnormal left ventricular relaxation (grade 1 diastolic dysfunction). Septal thickness, ED (2D): 13 mm. - Aortic valve: Probably trileaflet;  severely calcified leaflets. There was severe stenosis. Mean gradient (S): 52 mm Hg. Peak gradient (S): 81 mm Hg. Valve area (VTI): 0.54 cm^2. Valve area (Vmax): 0.53 cm^2. - Mitral valve: There was trivial regurgitation. - Left atrium: The atrium was mildly dilated. - Right ventricle: The cavity size was normal. Systolic function was normal. - Pulmonary arteries: No complete TR doppler jet so unable to estimate PA systolic pressure. - Systemic veins: IVC was poorly visualized.  Impressions:  - Normal LV size with mild LV hypertrophy. EF 55%. Normal RV size and systolic function. Severe aortic stenosis.  Transthoracic echocardiography. M-mode, complete 2D, spectral Doppler, and color Doppler. Birthdate: Patient birthdate: [birth year]. Age: Patient is 54 yr old. Sex: Gender: male. BMI: 27.8 kg/m^2. Blood pressure:   120/74 Patient status: Inpatient. Study date: Study date: 02/03/2015. Study time: 12:53 PM. Location: Bedside.  -------------------------------------------------------------------  ------------------------------------------------------------------- Left ventricle: The cavity size was normal. Wall thickness was increased in a pattern of mild LVH. The estimated ejection fraction was 55%. Although no diagnostic regional wall motion abnormality was identified, this possibility cannot be completely excluded on the basis of this study. Doppler parameters are consistent with abnormal left ventricular relaxation (grade 1 diastolic dysfunction).  ------------------------------------------------------------------- Aortic valve:  Probably trileaflet; severely calcified leaflets. Doppler:  There was severe stenosis.  There was no significant regurgitation.  VTI ratio of LVOT to aortic valve: 0.17. Valve area (VTI): 0.54 cm^2. Indexed valve area (VTI): 0.25 cm^2/m^2. Peak velocity ratio of LVOT to aortic valve: 0.17. Valve area (Vmax): 0.53 cm^2.  Indexed valve area (Vmax): 0.25 cm^2/m^2. Mean velocity ratio of LVOT to aortic valve: 0.15. Indexed valve area (Vmean): 0.21 cm^2/m^2.  Mean gradient (S): 52 mm Hg. Peak gradient (S): 81 mm Hg.  ------------------------------------------------------------------- Aorta: Aortic root: The aortic root was normal in size. Ascending aorta: The ascending aorta was normal in size.  ------------------------------------------------------------------- Mitral valve:  Normal thickness leaflets . Doppler:  There was no evidence for stenosis.  There was trivial regurgitation.  ------------------------------------------------------------------- Left atrium: The atrium was mildly dilated.  ------------------------------------------------------------------- Right ventricle: The cavity size was normal. Systolic function was normal.  ------------------------------------------------------------------- Pulmonic valve:  Structurally normal valve.  Cusp separation was normal. Doppler: Transvalvular velocity was within the normal range. There was no regurgitation.  ------------------------------------------------------------------- Tricuspid valve:  Doppler: There was no significant regurgitation.  ------------------------------------------------------------------- Pulmonary artery:  No complete TR doppler jet so unable  to estimate PA systolic pressure.  ------------------------------------------------------------------- Right atrium: The atrium was normal in size.  ------------------------------------------------------------------- Pericardium: There was no pericardial effusion.  ------------------------------------------------------------------- Systemic veins: IVC was poorly visualized.  ------------------------------------------------------------------- Measurements  Left ventricle              Value     Reference LV ID, ED, PLAX chordal           47.8 mm    43 - 52 LV ID, ES, PLAX chordal          35.8 mm    23 - 38 LV fx shortening, PLAX chordal  (L)   25  %    >=29 Stroke volume, 2D             54  ml    --------- Stroke volume/bsa, 2D           25  ml/m^2  --------- LV e&', lateral              6.64 cm/s   --------- LV E/e&', lateral             8.84      --------- LV e&', medial               3.48 cm/s   --------- LV E/e&', medial              16.87     --------- LV e&', average              5.06 cm/s   --------- LV E/e&', average             11.6      ---------  Ventricular septum            Value     Reference IVS thickness, ED             11.5 mm    ---------  LVOT                   Value     Reference LVOT ID, S                20  mm    --------- LVOT area                 3.14 cm^2   --------- LVOT peak velocity, S           76.8 cm/s   --------- LVOT mean velocity, S           48.7 cm/s   --------- LVOT VTI, S                17.1 cm    ---------  Aortic valve               Value     Reference Aortic valve peak velocity, S       451  cm/s   --------- Aortic valve mean velocity, S       335  cm/s   --------- Aortic valve VTI, S            99.9 cm    --------- Aortic mean gradient, S          52  mm Hg  --------- Aortic peak gradient, S          81  mm Hg  --------- VTI ratio, LVOT/AV            0.17      ---------  Aortic valve area, VTI          0.54 cm^2   --------- Aortic valve area/bsa, VTI        0.25 cm^2/m^2 --------- Velocity ratio, peak, LVOT/AV       0.17       --------- Aortic valve area, peak velocity     0.53 cm^2   --------- Aortic valve area/bsa, peak        0.25 cm^2/m^2 --------- velocity Velocity ratio, mean, LVOT/AV       0.15      --------- Aortic valve area/bsa, mean        0.21 cm^2/m^2 --------- velocity  Aorta                   Value     Reference Aortic root ID, ED            32  mm    ---------  Left atrium                Value     Reference LA ID, A-P, ES              44  mm    --------- LA ID/bsa, A-P              2.05 cm/m^2  <=2.2 LA volume, S               67.4 ml    --------- LA volume/bsa, S             31.5 ml/m^2  --------- LA volume, ES, 1-p A4C          53.5 ml    --------- LA volume/bsa, ES, 1-p A4C        25  ml/m^2  --------- LA volume, ES, 1-p A2C          82.3 ml    --------- LA volume/bsa, ES, 1-p A2C        38.4 ml/m^2  ---------  Mitral valve               Value     Reference Mitral E-wave peak velocity        58.7 cm/s   --------- Mitral A-wave peak velocity        62.1 cm/s   --------- Mitral deceleration time     (H)   275  ms    150 - 230 Mitral E/A ratio, peak          0.9      ---------  Systemic veins              Value     Reference Estimated CVP               3   mm Hg  ---------  Right ventricle              Value     Reference TAPSE                   25.4 mm    ---------  Legend: (L) and (H) mark values outside specified reference range.  ------------------------------------------------------------------- Prepared and Electronically Authenticated by  Loralie Champagne,  M.D. 2016-08-05T18:23:04  Cardiac Cath:  Conclusion     Normal coronary arteries  Severe aortic stenosis with a calculated aortic valve area of 0.74 cm based upon a peak transaortic valve gradiant of 57 mmHg and mean pressure of 47 mmHg. Cardiac output was 5.7 L/m.  Normal  left ventricular systolic function. Left ventricular end-diastolic pressure is also normal.  Normal pulmonary artery pressures.    RECOMMENDATIONS:    Per treating team     Coronary Findings    Dominance: Co-dominant   Left Anterior Descending   . First Diagonal Branch   The vessel is moderate in size.   Marland Kitchen Second Diagonal Branch   The vessel is small in size.   . Third Diagonal Branch   The vessel is small in size.       Right Heart Pressures She was performed via the right antecubital vein.    Wall Motion                 Left Heart    Left Ventricle The left ventricular ejection fraction is 55-65% by visual estimate.    Coronary Diagrams    Diagnostic Diagram            Implants    Name ID Temporary Type Supply   No information to display    Hemo Data       Most Recent Value   Fick Cardiac Output  5.74 L/min   Fick Cardiac Output Index  2.68 (L/min)/BSA   Aortic Mean Gradient  46.6 mmHg   Aortic Peak Gradient  57 mmHg   Aortic Valve Area  0.74   Aortic Value Area Index  0.35 cm2/BSA   RA A Wave  4 mmHg   RA V Wave  2 mmHg   RA Mean  2 mmHg   RV Systolic Pressure  33 mmHg   RV Diastolic Pressure  2 mmHg   RV EDP  4 mmHg   PA Systolic Pressure  31 mmHg   PA Diastolic Pressure  8 mmHg   PA Mean  20 mmHg   PW A Wave  11 mmHg   PW V Wave  9 mmHg   PW Mean  8 mmHg   AO Systolic Pressure  700 mmHg   AO Diastolic Pressure  72 mmHg   AO Mean  88 mmHg   LV Systolic Pressure  174  mmHg   LV Diastolic Pressure  10 mmHg   LV EDP  13 mmHg   Arterial Occlusion Pressure Extended Systolic Pressure  944 mmHg   Arterial Occlusion Pressure Extended Diastolic Pressure  79 mmHg   Arterial Occlusion Pressure Extended Mean Pressure  99 mmHg   Left Ventricular Apex Extended Systolic Pressure  967 mmHg   Left Ventricular Apex Extended Diastolic Pressure  8 mmHg   Left Ventricular Apex Extended EDP Pressure  11 mmHg   QP/QS  1   TPVR Index  7.46 HRUI   TSVR Index  36.19 HRUI   PVR SVR Ratio  0.13   TPVR/TSVR Ratio  0.21    Order-Level Documents - 02/27/15:      Scan on 02/28/2015 2:47 PM by Provider Default, MDScan on 02/28/2015 2:47 PM by Provider Default, MD    Encounter-Level Documents - 02/27/15:      Scan on 02/28/2015 5:38 PM by Provider Default, MDScan on 02/28/2015 5:38 PM by Provider Default, MD     Scan on 03/01/2015 9:04 AM by Provider Default, MDScan on 03/01/2015 9:04 AM by Provider Default, MD     Electronic signature on 02/27/2015 11:17 AM     Electronic signature on 02/27/2015 11:17 AM    Signed    Electronically signed by Belva Crome, MD on 02/28/15 at 1755 EDT    Assessment/Plan:  He has  stage D severe symptomatic aortic stenosis with normal coronaries and LV function. I have personally reviewed his cath and echos. 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 This valve requires replacement.  He is only 54 years old but has a long history of polysubstance abuse and noncompliance and is essentially homeless so I think a tissue valve would be the best option for him. I discussed this with him including the risk of structural valve deterioration requiring prosthetic valve replacement and he understands and agrees. The etiology of his TIA is unclear. He has been in sinus rhythm on ECG done in early August and during this admission.  There was no intracardiac clot seen on echo. He could have flicked something off his valve or aorta and he does have some intracranial atherosclerosis. With a prosthetic valve in place he would have to be on coumadin or aspirin or aspirin/Plavix. I discussed the operative procedure of AVR using a tissue valve with the patient  including alternatives, benefits and risks; including but not limited to bleeding, blood transfusion, infection, stroke, myocardial infarction, structural valve deterioration requiring replacement,  heart block requiring a permanent pacemaker, organ dysfunction, and death.  Donald [name] understands and agrees to proceed.      Gaye Pollack

## 2015-04-21 NOTE — Transfer of Care (Signed)
Immediate Anesthesia Transfer of Care Note  Patient: Donald [name]  Procedure(s) Performed: Procedure(s): AORTIC VALVE REPLACEMENT (AVR) (N/A) TRANSESOPHAGEAL ECHOCARDIOGRAM (TEE) (N/A)  Patient Location: ICU  Anesthesia Type:General  Level of Consciousness: sedated and Patient remains intubated per anesthesia plan  Airway & Oxygen Therapy: Patient remains intubated per anesthesia plan and Patient placed on Ventilator (see vital sign flow sheet for setting)  Post-op Assessment: Report given to RN and Post -op Vital signs reviewed and stable  Post vital signs: Reviewed and stable  Last Vitals:  Filed Vitals:   Aug 07, 2014 1234  BP: 122/68  Pulse: 73  Temp:   Resp: 12  PA 27/18 (22) SpO2 97% on 0.5 fiO2 T 35.9  Complications: No apparent anesthesia complications

## 2015-04-21 NOTE — Anesthesia Postprocedure Evaluation (Signed)
  Anesthesia Post-op Note  Patient: Donald [name]  Procedure(s) Performed: Procedure(s): AORTIC VALVE REPLACEMENT (AVR) (N/A) TRANSESOPHAGEAL ECHOCARDIOGRAM (TEE) (N/A)  Patient Location: ICU  Anesthesia Type:General  Level of Consciousness: sedated  Airway and Oxygen Therapy: Patient remains intubated per anesthesia plan  Post-op Pain: none  Post-op Assessment: Post-op Vital signs reviewed, Patient's Cardiovascular Status Stable, Respiratory Function Stable, Patent Airway, No signs of Nausea or vomiting and Pain level controlled              Post-op Vital Signs: Reviewed and stable  Last Vitals:  Filed Vitals:   04/04/2015 1234  BP: 122/68  Pulse: 73  Temp:   Resp: 12    Complications: No apparent anesthesia complications

## 2015-04-21 NOTE — Interval H&P Note (Signed)
History and Physical Interval Note:  11/10/2014 5:48 AM  Donald ReichertLarry E Heffner  has presented today for surgery, with the diagnosis of SEVERE AS  The various methods of treatment have been discussed with the patient and family. After consideration of risks, benefits and other options for treatment, the patient has consented to  Procedure(s): AORTIC VALVE REPLACEMENT (AVR) (N/A) TRANSESOPHAGEAL ECHOCARDIOGRAM (TEE) (N/A) as a surgical intervention .  The patient's history has been reviewed, patient examined, no change in status, stable for surgery.  I have reviewed the patient's chart and labs.  Questions were answered to the patient's satisfaction.     Alleen BorneBryan K Bartle

## 2015-04-21 NOTE — Progress Notes (Signed)
  Echocardiogram Echocardiogram Transesophageal has been performed.  Tye SavoyCasey N Tonesha Tsou 08/04/14, 9:53 AM

## 2015-04-22 ENCOUNTER — Inpatient Hospital Stay (HOSPITAL_COMMUNITY): Payer: Medicare HMO

## 2015-04-22 LAB — POCT I-STAT, CHEM 8
BUN: 15 mg/dL (ref 6–20)
CREATININE: 0.9 mg/dL (ref 0.61–1.24)
Calcium, Ion: 1.13 mmol/L (ref 1.12–1.23)
Chloride: 96 mmol/L — ABNORMAL LOW (ref 101–111)
GLUCOSE: 121 mg/dL — AB (ref 65–99)
HCT: 37 % — ABNORMAL LOW (ref 39.0–52.0)
HEMOGLOBIN: 12.6 g/dL — AB (ref 13.0–17.0)
POTASSIUM: 4.2 mmol/L (ref 3.5–5.1)
Sodium: 134 mmol/L — ABNORMAL LOW (ref 135–145)
TCO2: 25 mmol/L (ref 0–100)

## 2015-04-22 LAB — CBC
HEMATOCRIT: 36.6 % — AB (ref 39.0–52.0)
HEMATOCRIT: 35.5 % — AB (ref 39.0–52.0)
HEMOGLOBIN: 11.9 g/dL — AB (ref 13.0–17.0)
Hemoglobin: 11.9 g/dL — ABNORMAL LOW (ref 13.0–17.0)
MCH: 29.8 pg (ref 26.0–34.0)
MCH: 30.4 pg (ref 26.0–34.0)
MCHC: 32.5 g/dL (ref 30.0–36.0)
MCHC: 33.5 g/dL (ref 30.0–36.0)
MCV: 91.5 fL (ref 78.0–100.0)
MCV: 90.8 fL (ref 78.0–100.0)
Platelets: 126 10*3/uL — ABNORMAL LOW (ref 150–400)
Platelets: 112 10*3/uL — ABNORMAL LOW (ref 150–400)
RBC: 4 MIL/uL — ABNORMAL LOW (ref 4.22–5.81)
RBC: 3.91 MIL/uL — ABNORMAL LOW (ref 4.22–5.81)
RDW: 13.7 % (ref 11.5–15.5)
RDW: 13.7 % (ref 11.5–15.5)
WBC: 18.6 10*3/uL — AB (ref 4.0–10.5)
WBC: 16.5 10*3/uL — ABNORMAL HIGH (ref 4.0–10.5)

## 2015-04-22 LAB — GLUCOSE, CAPILLARY
GLUCOSE-CAPILLARY: 112 mg/dL — AB (ref 65–99)
GLUCOSE-CAPILLARY: 121 mg/dL — AB (ref 65–99)
GLUCOSE-CAPILLARY: 104 mg/dL — AB (ref 65–99)
GLUCOSE-CAPILLARY: 121 mg/dL — AB (ref 65–99)
Glucose-Capillary: 101 mg/dL — ABNORMAL HIGH (ref 65–99)
Glucose-Capillary: 115 mg/dL — ABNORMAL HIGH (ref 65–99)
Glucose-Capillary: 131 mg/dL — ABNORMAL HIGH (ref 65–99)
Glucose-Capillary: 132 mg/dL — ABNORMAL HIGH (ref 65–99)
Glucose-Capillary: 80 mg/dL (ref 65–99)
Glucose-Capillary: 88 mg/dL (ref 65–99)
Glucose-Capillary: 97 mg/dL (ref 65–99)

## 2015-04-22 LAB — CREATININE, SERUM: Creatinine, Ser: 0.96 mg/dL (ref 0.61–1.24)

## 2015-04-22 LAB — BASIC METABOLIC PANEL
ANION GAP: 7 (ref 5–15)
BUN: 12 mg/dL (ref 6–20)
CO2: 26 mmol/L (ref 22–32)
Calcium: 7.6 mg/dL — ABNORMAL LOW (ref 8.9–10.3)
Chloride: 99 mmol/L — ABNORMAL LOW (ref 101–111)
Creatinine, Ser: 1.02 mg/dL (ref 0.61–1.24)
GFR calc Af Amer: >60 mL/min (ref >60)
GFR calc non Af Amer: >60 mL/min (ref >60)
GLUCOSE: 104 mg/dL — AB (ref 65–99)
Potassium: 4.2 mmol/L (ref 3.5–5.1)
Sodium: 132 mmol/L — ABNORMAL LOW (ref 135–145)

## 2015-04-22 LAB — MAGNESIUM
MAGNESIUM: 2.1 mg/dL (ref 1.7–2.4)
Magnesium: 2.2 mg/dL (ref 1.7–2.4)

## 2015-04-22 MED ORDER — CHLORHEXIDINE GLUCONATE CLOTH 2 % EX PADS
6.0000 | MEDICATED_PAD | Freq: Every day | CUTANEOUS | Status: AC
  Administered 2015-04-22 – 2015-04-26 (×5): 6 via TOPICAL

## 2015-04-22 MED ORDER — METOCLOPRAMIDE HCL 10 MG PO TABS
10.0000 mg | ORAL_TABLET | Freq: Three times a day (TID) | ORAL | Status: AC
  Administered 2015-04-22 – 2015-04-23 (×4): 10 mg via ORAL
  Filled 2015-04-22 (×4): qty 1

## 2015-04-22 MED ORDER — INSULIN ASPART 100 UNIT/ML ~~LOC~~ SOLN: 0.0000 [IU] | SUBCUTANEOUS | Status: DC

## 2015-04-22 MED ORDER — INSULIN ASPART 100 UNIT/ML ~~LOC~~ SOLN
0.0000 [IU] | SUBCUTANEOUS | Status: DC
  Administered 2015-04-22 – 2015-04-23 (×5): 2 [IU] via SUBCUTANEOUS
  Administered 2015-04-23: 8 [IU] via SUBCUTANEOUS
  Administered 2015-04-23 – 2015-04-24 (×2): 2 [IU] via SUBCUTANEOUS

## 2015-04-22 MED ORDER — GUAIFENESIN ER 600 MG PO TB12
600.0000 mg | ORAL_TABLET | Freq: Two times a day (BID) | ORAL | Status: DC | PRN
  Administered 2015-04-24 – 2015-04-27 (×3): 600 mg via ORAL
  Filled 2015-04-22 (×4): qty 1

## 2015-04-22 MED ORDER — MUPIROCIN 2 % EX OINT
1.0000 "application " | TOPICAL_OINTMENT | Freq: Two times a day (BID) | CUTANEOUS | Status: AC
  Administered 2015-04-22 – 2015-04-24 (×6): 1 via NASAL
  Filled 2015-04-22: qty 22

## 2015-04-22 MED ORDER — ENOXAPARIN SODIUM 40 MG/0.4ML ~~LOC~~ SOLN
40.0000 mg | SUBCUTANEOUS | Status: DC
  Administered 2015-04-22: 40 mg via SUBCUTANEOUS
  Filled 2015-04-22 (×2): qty 0.4

## 2015-04-22 NOTE — Plan of Care (Signed)
Problem: Phase II - Intermediate Post-Op Goal: Advance Diet Outcome: Progressing clear Goal: Activity Progressed Outcome: Progressing Up to chair and ambulated 240 feet x 1

## 2015-04-22 NOTE — Progress Notes (Signed)
Patient ID: Donald ReichertLarry E [name], male   DOB: 23-Apr-1961, 54 y.o.   MRN: [id number] EVENING ROUNDS NOTE :     [address]       [place] [id number]             [phone]                 1 Day Post-Op Procedure(s) (LRB): AORTIC VALVE REPLACEMENT (AVR) (N/A) TRANSESOPHAGEAL ECHOCARDIOGRAM (TEE) (N/A)  Total Length of Stay:  LOS: 1 day  BP 132/74 mmHg  Pulse 93  Temp(Src) 97 F (36.1 C) (Oral)  Resp 19  Wt 212 lb 15.4 oz (96.6 kg)  SpO2 96%  .Intake/Output      10/22 0701 - 10/23 0700   P.O. 750   I.V. (mL/kg) 244 (2.5)   Blood    NG/GT    IV Piggyback    Total Intake(mL/kg) 994 (10.3)   Urine (mL/kg/hr) 1130 (0.9)   Emesis/NG output 300 (0.3)   Other    Blood    Chest Tube 60 (0.1)   Total Output 1490   Net -496         . sodium chloride Stopped (04/22/15 1100)  . sodium chloride 250 mL (04/22/15 0700)  . sodium chloride Stopped (2014/07/22 1900)  . dexmedetomidine Stopped (04/22/15 0500)  . lactated ringers 20 mL/hr at 04/22/15 1721  . lactated ringers 10 mL/hr at 04/22/15 0700  . nitroGLYCERIN Stopped (2014/07/22 1700)  . phenylephrine (NEO-SYNEPHRINE) Adult infusion Stopped (2014/07/22 1310)     Lab Results  Component Value Date   WBC 16.5* 04/22/2015   HGB 12.6* 04/22/2015   HCT 37.0* 04/22/2015   PLT PENDING 04/22/2015   GLUCOSE 121* 04/22/2015   CHOL 208* 02/28/2015   TRIG 98 02/28/2015   HDL 44 02/28/2015   LDLCALC 144* 02/28/2015   ALT 22 04/19/2015   AST 18 04/19/2015   NA 134* 04/22/2015   K 4.2 04/22/2015   CL 96* 04/22/2015   CREATININE 0.90 04/22/2015   BUN 15 04/22/2015   CO2 26 04/22/2015   TSH 0.477 02/27/2015   INR 1.53* 02/16/15   HGBA1C 5.8* 04/19/2015   Sinus rhythm, walking in unit well   Delight OvensEdward B Atharva Mirsky MD  Beeper [phone] Office [phone] 04/22/2015 7:22 PM

## 2015-04-22 NOTE — Plan of Care (Signed)
Problem: Phase II - Intermediate Post-Op Goal: Pain controlled with appropriate interventions Outcome: Completed/Met Date Met:  04/22/15 Pt pain med changed to dilaudid

## 2015-04-22 NOTE — Progress Notes (Addendum)
Patient ID: Donald [name], male   DOB: 01-10-1961, 54 y.o.   MRN: [id number] [facility] DAILY ICU PROGRESS NOTE                   [address]            [place] [address]          [phone]   1 Day Post-Op Procedure(s) (LRB): AORTIC VALVE REPLACEMENT (AVR) (N/A) TRANSESOPHAGEAL ECHOCARDIOGRAM (TEE) (N/A)  Total Length of Stay:  LOS: 1 day   Subjective: Awake and alert , neuro intact, not very cooperative   Objective: Vital signs in last 24 hours: Temp:  [95.9 F (35.5 C)-100.8 F (38.2 C)] 98.4 F (36.9 C) (10/22 0909) Pulse Rate:  [69-99] 93 (10/22 0909) Cardiac Rhythm:  [-] Normal sinus rhythm (10/22 0900) Resp:  [11-25] 19 (10/22 0909) BP: (83-154)/(58-103) 121/80 mmHg (10/22 0900) SpO2:  [90 %-100 %] 93 % (10/22 0939) Arterial Line BP: (68-164)/(52-101) 124/68 mmHg (10/22 0909) FiO2 (%):  [40 %-50 %] 40 % (10/21 1641) Weight:  [212 lb 15.4 oz (96.6 kg)] 212 lb 15.4 oz (96.6 kg) (10/22 0506)  Filed Weights   04/27/2015 0630 04/22/15 0506  Weight: 201 lb 14.4 oz (91.581 kg) 212 lb 15.4 oz (96.6 kg)    Weight change: 11 lb 1 oz (5.019 kg)   Hemodynamic parameters for last 24 hours: PAP: (17-51)/(7-29) 47/19 mmHg CO:  [3.6 L/min-9.1 L/min] 6.8 L/min CI:  [1.7 L/min/m2-4.3 L/min/m2] 3.2 L/min/m2  Intake/Output from previous day: 10/21 0701 - 10/22 0700 In: 5685.2 [P.O.:240; I.V.:3935.2; Blood:530; NG/GT:30; IV Piggyback:950] Out: 6398 [Urine:2830; Emesis/NG output:30; Blood:2139; Chest Tube:660]  Intake/Output this shift: Total I/O In: -  Out: 260 [Urine:260]  Current Meds: Scheduled Meds: . acetaminophen  1,000 mg Oral 4 times per day   Or  . acetaminophen (TYLENOL) oral liquid 160 mg/5 mL  1,000 mg Per Tube 4 times per day  . amiodarone  200 mg Oral Daily  . antiseptic oral rinse  7 mL Mouth Rinse QID  . aspirin EC  325 mg Oral Daily   Or  . aspirin  324 mg Per Tube Daily  . atorvastatin  40 mg Oral Daily  . bisacodyl  10 mg Oral Daily   Or  . bisacodyl  10 mg Rectal Daily  . budesonide-formoterol  2 puff Inhalation BID  . cefUROXime (ZINACEF)  IV  1.5 g Intravenous Q12H  . chlorhexidine gluconate  15 mL Mouth Rinse BID  . Chlorhexidine Gluconate Cloth  6 each Topical Q0600  . docusate sodium  200 mg Oral Daily  . insulin aspart  0-24 Units Subcutaneous 6 times per day  . metoprolol tartrate  12.5 mg Oral BID   Or  . metoprolol tartrate  12.5 mg Per Tube BID  . mupirocin ointment  1 application Nasal BID  . [START ON 04/23/2015] pantoprazole  40 mg Oral Daily  . sodium chloride  3 mL Intravenous Q12H   Continuous Infusions: . sodium chloride 10 mL/hr at 04/22/15 0700  . sodium chloride 250 mL (04/22/15 0700)  . sodium chloride Stopped (04/27/15 1900)  . dexmedetomidine Stopped (04/22/15 0500)  . lactated ringers Stopped (2015/04/27 1230)  . lactated ringers 10 mL/hr at 04/22/15 0700  . nitroGLYCERIN Stopped (04/27/15 1700)  . phenylephrine (NEO-SYNEPHRINE) Adult infusion Stopped (04-27-15 1310)   PRN Meds:.sodium chloride, albumin human, HYDROmorphone (DILAUDID) injection, ipratropium-albuterol, lactated ringers, metoprolol, ondansetron (ZOFRAN) IV, oxyCODONE, sodium chloride, traMADol  General appearance: alert  and cooperative Neurologic: intact Heart: regular rate and rhythm, S1, S2 normal, no murmur, click, rub or gallop Lungs: diminished breath sounds bibasilar Abdomen: soft, non-tender; bowel sounds normal; no masses,  no organomegaly Extremities: extremities normal, atraumatic, no cyanosis or edema and Homans sign is negative, no sign of DVT Wound: sternum stable  Lab Results: CBC: Recent Labs  04/22/2015 1824 04/22/15 0510  WBC 14.4* 18.6*  HGB 11.9* 11.9*  HCT 37.1* 36.6*  PLT 130* 126*   BMET:  Recent Labs  04/19/15 1159  04/04/2015 1814 04/25/2015 1824 04/22/15 0510  NA 137  < > 136  --  132*  K 4.1  < > 4.2  --  4.2  CL 103  < > 105  --  99*  CO2 24  --   --   --  26  GLUCOSE 100*  < > 122*   --  104*  BUN 19  < > 15  --  12  CREATININE 1.09  < > 1.00 1.02 1.02  CALCIUM 8.9  --   --   --  7.6*  < > = values in this interval not displayed.  PT/INR:  Recent Labs  05/01/2015 1247  LABPROT 18.4*  INR 1.53*   Radiology: Dg Chest Port 1 View  04/22/2015  CLINICAL DATA:  Post aortic valve replacement. EXAM: PORTABLE CHEST 1 VIEW COMPARISON:  04/24/2015 FINDINGS: Sternotomy wires unchanged. Right IJ Swan-Ganz catheter with tip over the main pulmonary artery segment. Mediastinal drain unchanged. Interval removal of nasogastric tube and endotracheal tube. Prosthetic aortic valve unchanged. Lungs are adequately inflated without consolidation, effusion or pneumothorax. Cardiomediastinal silhouette and remainder of the exam is unchanged. IMPRESSION: No acute cardiopulmonary disease. Tubes and lines as described. Electronically Signed   By: Elberta Fortisaniel  Boyle M.D.   On: 04/22/2015 09:09   Dg Chest Port 1 View  04/23/2015  CLINICAL DATA:  Status post aortic valve repair EXAM: PORTABLE CHEST - 1 VIEW COMPARISON:  04/19/2015 FINDINGS: Cardiac shadow is within normal limits. Postsurgical changes are seen. A Swan-Ganz catheter is noted at the level of the pulmonary outflow tract. A nasogastric catheter extends into the stomach. An endotracheal tube is seen 10 cm above the carina at the level of thoracic inlet. Mediastinal drain is noted. No pneumothorax or focal infiltrate is seen. IMPRESSION: Tubes and lines as described above. Postsurgical changes without acute abnormality. Electronically Signed   By: Alcide CleverMark  Lukens M.D.   On: 04/30/2015 13:13     Assessment/Plan: S/P Procedure(s) (LRB): AORTIC VALVE REPLACEMENT (AVR) (N/A) TRANSESOPHAGEAL ECHOCARDIOGRAM (TEE) (N/A) Mobilize Diuresis Resume xarleto tomorrow   Delight Ovensdward B Maria Gallicchio 04/22/2015 9:48 AM

## 2015-04-23 ENCOUNTER — Inpatient Hospital Stay (HOSPITAL_COMMUNITY): Payer: Medicare HMO

## 2015-04-23 LAB — CBC
HCT: 34.8 % — ABNORMAL LOW (ref 39.0–52.0)
Hemoglobin: 11.3 g/dL — ABNORMAL LOW (ref 13.0–17.0)
MCH: 29.7 pg (ref 26.0–34.0)
MCHC: 32.5 g/dL (ref 30.0–36.0)
MCV: 91.6 fL (ref 78.0–100.0)
Platelets: 92 10*3/uL — ABNORMAL LOW (ref 150–400)
RBC: 3.8 MIL/uL — ABNORMAL LOW (ref 4.22–5.81)
RDW: 13.6 % (ref 11.5–15.5)
WBC: 12.8 10*3/uL — ABNORMAL HIGH (ref 4.0–10.5)

## 2015-04-23 LAB — GLUCOSE, CAPILLARY
GLUCOSE-CAPILLARY: 131 mg/dL — AB (ref 65–99)
GLUCOSE-CAPILLARY: 138 mg/dL — AB (ref 65–99)
GLUCOSE-CAPILLARY: 141 mg/dL — AB (ref 65–99)
GLUCOSE-CAPILLARY: 239 mg/dL — AB (ref 65–99)
GLUCOSE-CAPILLARY: 87 mg/dL (ref 65–99)
Glucose-Capillary: 123 mg/dL — ABNORMAL HIGH (ref 65–99)

## 2015-04-23 LAB — BASIC METABOLIC PANEL
Anion gap: 4 — ABNORMAL LOW (ref 5–15)
BUN: 11 mg/dL (ref 6–20)
CO2: 31 mmol/L (ref 22–32)
Calcium: 8 mg/dL — ABNORMAL LOW (ref 8.9–10.3)
Chloride: 99 mmol/L — ABNORMAL LOW (ref 101–111)
Creatinine, Ser: 1 mg/dL (ref 0.61–1.24)
GFR calc Af Amer: >60 mL/min (ref >60)
GFR calc non Af Amer: >60 mL/min (ref >60)
Glucose, Bld: 130 mg/dL — ABNORMAL HIGH (ref 65–99)
Potassium: 4.3 mmol/L (ref 3.5–5.1)
Sodium: 134 mmol/L — ABNORMAL LOW (ref 135–145)

## 2015-04-23 MED ORDER — MIDAZOLAM HCL 2 MG/2ML IJ SOLN
INTRAMUSCULAR | Status: AC
  Filled 2015-04-23: qty 2

## 2015-04-23 MED ORDER — CHLORHEXIDINE GLUCONATE 0.12 % MT SOLN
OROMUCOSAL | Status: AC
  Administered 2015-04-23: 15 mL via OROMUCOSAL
  Filled 2015-04-23: qty 15

## 2015-04-23 MED ORDER — ASPIRIN EC 81 MG PO TBEC
81.0000 mg | DELAYED_RELEASE_TABLET | Freq: Every day | ORAL | Status: DC
Start: 2015-04-23 — End: 2015-04-25
  Administered 2015-04-24 – 2015-04-25 (×2): 81 mg via ORAL
  Filled 2015-04-23 (×3): qty 1

## 2015-04-23 MED ORDER — DILTIAZEM HCL 100 MG IV SOLR
5.0000 mg/h | INTRAVENOUS | Status: DC
  Administered 2015-04-23 (×2): 10 mg/h via INTRAVENOUS
  Administered 2015-04-24 (×3): 15 mg/h via INTRAVENOUS
  Filled 2015-04-23 (×7): qty 100

## 2015-04-23 MED ORDER — RIVAROXABAN 20 MG PO TABS
20.0000 mg | ORAL_TABLET | Freq: Every day | ORAL | Status: DC
  Administered 2015-04-23 – 2015-04-27 (×5): 20 mg via ORAL
  Filled 2015-04-23 (×6): qty 1

## 2015-04-23 MED ORDER — ASPIRIN 81 MG PO CHEW
81.0000 mg | CHEWABLE_TABLET | Freq: Every day | ORAL | Status: DC
  Administered 2015-04-23: 81 mg
  Filled 2015-04-23: qty 1

## 2015-04-23 MED ORDER — DILTIAZEM HCL 100 MG IV SOLR
INTRAVENOUS | Status: AC
  Administered 2015-04-23: 10 mg/h via INTRAVENOUS
  Filled 2015-04-23: qty 100

## 2015-04-23 MED ORDER — DILTIAZEM LOAD VIA INFUSION
10.0000 mg | Freq: Once | INTRAVENOUS | Status: AC
  Administered 2015-04-23: 10 mg via INTRAVENOUS

## 2015-04-23 NOTE — Progress Notes (Signed)
 Patient ID: Donald ReichertLarry E [name], male   DOB: 01-10-1961, 54 y.o.   MRN: [id number] [facility] DAILY ICU PROGRESS NOTE                   [address]            [place] [id number]          [phone]   2 Days Post-Op Procedure(s) (LRB): AORTIC VALVE REPLACEMENT (AVR) (N/A) TRANSESOPHAGEAL ECHOCARDIOGRAM (TEE) (N/A)  Total Length of Stay:  LOS: 2 days   Subjective: awake and alert , now in rapid afib,   Objective: Vital signs in last 24 hours: Temp:  [97 F (36.1 C)-100.7 F (38.2 C)] 100.7 F (38.2 C) (10/23 0750) Pulse Rate:  [65-110] 65 (10/23 0700) Cardiac Rhythm:  [-] Normal sinus rhythm (10/22 2000) Resp:  [11-33] 22 (10/23 0700) BP: (101-161)/(64-94) 115/80 mmHg (10/23 0700) SpO2:  [92 %-100 %] 96 % (10/23 0750) Arterial Line BP: (105-128)/(60-74) 128/74 mmHg (10/22 1100) Weight:  [208 lb 8.9 oz (94.6 kg)] 208 lb 8.9 oz (94.6 kg) (10/23 0300)  Filed Weights   20-Dec-2014 0630 04/22/15 0506 04/23/15 0300  Weight: 201 lb 14.4 oz (91.581 kg) 212 lb 15.4 oz (96.6 kg) 208 lb 8.9 oz (94.6 kg)    Weight change: -4 lb 6.6 oz (-2 kg)   Hemodynamic parameters for last 24 hours: PAP: (42-48)/(22-24) 48/24 mmHg  Intake/Output from previous day: 10/22 0701 - 10/23 0700 In: 1754 [P.O.:1150; I.V.:504; IV Piggyback:100] Out: 3165 [Urine:2805; Emesis/NG output:300; Chest Tube:60]  Intake/Output this shift:    Current Meds: Scheduled Meds: . acetaminophen  1,000 mg Oral 4 times per day   Or  . acetaminophen (TYLENOL) oral liquid 160 mg/5 mL  1,000 mg Per Tube 4 times per day  . amiodarone  200 mg Oral Daily  . antiseptic oral rinse  7 mL Mouth Rinse QID  . aspirin EC  325 mg Oral Daily   Or  . aspirin  324 mg Per Tube Daily  . atorvastatin  40 mg Oral Daily  . bisacodyl  10 mg Oral Daily   Or  . bisacodyl  10 mg Rectal Daily  . budesonide-formoterol  2 puff Inhalation BID  . chlorhexidine gluconate  15 mL Mouth Rinse BID  . Chlorhexidine Gluconate Cloth  6 each  Topical Q0600  . docusate sodium  200 mg Oral Daily  . enoxaparin (LOVENOX) injection  40 mg Subcutaneous Q24H  . insulin aspart  0-24 Units Subcutaneous 6 times per day  . metoCLOPramide  10 mg Oral TID AC & HS  . metoprolol tartrate  12.5 mg Oral BID   Or  . metoprolol tartrate  12.5 mg Per Tube BID  . mupirocin ointment  1 application Nasal BID  . pantoprazole  40 mg Oral Daily  . sodium chloride  3 mL Intravenous Q12H   Continuous Infusions: . sodium chloride Stopped (04/22/15 1100)  . sodium chloride Stopped (04/22/15 1900)  . sodium chloride Stopped (20-Dec-2014 1900)  . dexmedetomidine Stopped (04/22/15 0500)  . lactated ringers 20 mL/hr at 04/23/15 0600  . lactated ringers Stopped (04/22/15 1900)  . nitroGLYCERIN Stopped (20-Dec-2014 1700)  . phenylephrine (NEO-SYNEPHRINE) Adult infusion Stopped (20-Dec-2014 1310)   PRN Meds:.sodium chloride, guaiFENesin, HYDROmorphone (DILAUDID) injection, ipratropium-albuterol, lactated ringers, metoprolol, ondansetron (ZOFRAN) IV, oxyCODONE, sodium chloride, traMADol  General appearance: alert, cooperative and mild distress Neurologic: intact Heart: irregularly irregular rhythm and friction rub heard . Lungs: diminished breath sounds bibasilar Abdomen: soft,  non-tender; bowel sounds normal; no masses,  no organomegaly Extremities: extremities normal, atraumatic, no cyanosis or edema and Homans sign is negative, no sign of DVT Wound: sternum stable  Lab Results: CBC: Recent Labs  04/22/15 1700 04/22/15 1712 04/23/15 0319  WBC 16.5*  --  12.8*  HGB 11.9* 12.6* 11.3*  HCT 35.5* 37.0* 34.8*  PLT 112*  --  92*   BMET:  Recent Labs  04/22/15 0510  04/22/15 1712 04/23/15 0319  NA 132*  --  134* 134*  K 4.2  --  4.2 4.3  CL 99*  --  96* 99*  CO2 26  --   --  31  GLUCOSE 104*  --  121* 130*  BUN 12  --  15 11  CREATININE 1.02  < > 0.90 1.00  CALCIUM 7.6*  --   --  8.0*  < > = values in this interval not displayed.  PT/INR:  Recent  Labs   1247  LABPROT 18.4*  INR 1.53*   Radiology: Dg Chest Port 1 View  04/23/2015  CLINICAL DATA:  Post AVR on 04/13/2015, hypertension, atrial fibrillation, COPD, GERD, stroke, prior pulmonary embolism EXAM: PORTABLE CHEST 1 VIEW COMPARISON:  Portable exam 0541 hours compared to 04/22/2015 FINDINGS: Interval removal of mediastinal drain and Swan-Ganz catheter. RIGHT jugular central venous catheter tip projects over confluence of SVC. Enlargement of cardiac silhouette post median sternotomy and AVR. Pulmonary vascular congestion. Bibasilar atelectasis. Lungs otherwise clear. No pleural effusion or pneumothorax. Old LEFT rib fractures again noted. IMPRESSION: Bibasilar atelectasis. Enlargement of cardiac silhouette with vascular congestion post AVR. Electronically Signed   By: Ulyses Southward M.D.   On: 04/23/2015 08:44     Assessment/Plan: S/P Procedure(s) (LRB): AORTIC VALVE REPLACEMENT (AVR) (N/A) TRANSESOPHAGEAL ECHOCARDIOGRAM (TEE) (N/A) Mobilize Diuresis now in rapid afib preop was on xarleto for PE and history of afib, will resume tosya Thrombocytopenia  92,00 avoid heparin products Has been previous loaded with Cordarone and on po will add Cardizem for better immediate rate control    Delight Ovens 04/23/2015 9:25 AM

## 2015-04-23 NOTE — Progress Notes (Signed)
Patient ID: Donald ReichertLarry E Gruel, male   DOB: 1960/12/03, 54 y.o.   MRN: [id number] EVENING ROUNDS NOTE :     [address]       [place] [id number]             [phone]                 2 Days Post-Op Procedure(s) (LRB): AORTIC VALVE REPLACEMENT (AVR) (N/A) TRANSESOPHAGEAL ECHOCARDIOGRAM (TEE) (N/A)  Total Length of Stay:  LOS: 2 days  BP 104/64 mmHg  Pulse 78  Temp(Src) 96 F (35.6 C) (Oral)  Resp 21  Wt 208 lb 8.9 oz (94.6 kg)  SpO2 94%  .Intake/Output      10/22 0701 - 10/23 0700 10/23 0701 - 10/24 0700   P.O. 1150 990   I.V. (mL/kg) 504 (5.3) 204.3 (2.2)   Blood     NG/GT     IV Piggyback 100    Total Intake(mL/kg) 1754 (18.5) 1194.3 (12.6)   Urine (mL/kg/hr) 2805 (1.2) 330 (0.3)   Emesis/NG output 300 (0.1)    Other     Blood     Chest Tube 60 (0)    Total Output 3165 330   Net -1411 +864.3          . sodium chloride Stopped (04/22/15 1100)  . sodium chloride Stopped (04/22/15 1900)  . sodium chloride 20 mL/hr at 04/23/15 1505  . dexmedetomidine Stopped (04/22/15 0500)  . diltiazem (CARDIZEM) infusion 10 mg/hr (04/23/15 1732)  . lactated ringers 20 mL/hr at 04/23/15 0600  . lactated ringers Stopped (04/22/15 1900)  . nitroGLYCERIN Stopped (03-22-15 1700)  . phenylephrine (NEO-SYNEPHRINE) Adult infusion Stopped (03-22-15 1310)   . acetaminophen  1,000 mg Oral 4 times per day   Or  . acetaminophen (TYLENOL) oral liquid 160 mg/5 mL  1,000 mg Per Tube 4 times per day  . amiodarone  200 mg Oral Daily  . antiseptic oral rinse  7 mL Mouth Rinse QID  . aspirin EC  81 mg Oral Daily   Or  . aspirin  81 mg Per Tube Daily  . atorvastatin  40 mg Oral Daily  . bisacodyl  10 mg Oral Daily   Or  . bisacodyl  10 mg Rectal Daily  . budesonide-formoterol  2 puff Inhalation BID  . chlorhexidine gluconate  15 mL Mouth Rinse BID  . Chlorhexidine Gluconate Cloth  6 each Topical Q0600  . docusate sodium  200 mg Oral Daily  . insulin aspart  0-24 Units  Subcutaneous 6 times per day  . metoprolol tartrate  12.5 mg Oral BID   Or  . metoprolol tartrate  12.5 mg Per Tube BID  . mupirocin ointment  1 application Nasal BID  . pantoprazole  40 mg Oral Daily  . rivaroxaban  20 mg Oral Q supper  . sodium chloride  3 mL Intravenous Q12H    Lab Results  Component Value Date   WBC 12.8* 04/23/2015   HGB 11.3* 04/23/2015   HCT 34.8* 04/23/2015   PLT 92* 04/23/2015   GLUCOSE 130* 04/23/2015   CHOL 208* 02/28/2015   TRIG 98 02/28/2015   HDL 44 02/28/2015   LDLCALC 144* 02/28/2015   ALT 22 04/19/2015   AST 18 04/19/2015   NA 134* 04/23/2015   K 4.3 04/23/2015   CL 99* 04/23/2015   CREATININE 1.00 04/23/2015   BUN 11 04/23/2015   CO2 31 04/23/2015   TSH 0.477 02/27/2015   INR  1.53* 04/25/2015   HGBA1C 5.8* 04/19/2015   On cardizem drip for rapid rate afib, better controlled now  Delight Ovens MD  Beeper [phone] Office [phone] 04/23/2015 5:39 PM

## 2015-04-24 ENCOUNTER — Inpatient Hospital Stay (HOSPITAL_COMMUNITY): Payer: Medicare HMO

## 2015-04-24 ENCOUNTER — Encounter (HOSPITAL_COMMUNITY): Payer: Self-pay | Admitting: Surgery

## 2015-04-24 LAB — CBC
HCT: 34.1 % — ABNORMAL LOW (ref 39.0–52.0)
Hemoglobin: 11.1 g/dL — ABNORMAL LOW (ref 13.0–17.0)
MCH: 29.7 pg (ref 26.0–34.0)
MCHC: 32.6 g/dL (ref 30.0–36.0)
MCV: 91.2 fL (ref 78.0–100.0)
Platelets: 110 10*3/uL — ABNORMAL LOW (ref 150–400)
RBC: 3.74 MIL/uL — ABNORMAL LOW (ref 4.22–5.81)
RDW: 13.4 % (ref 11.5–15.5)
WBC: 15.1 10*3/uL — ABNORMAL HIGH (ref 4.0–10.5)

## 2015-04-24 LAB — GLUCOSE, CAPILLARY
GLUCOSE-CAPILLARY: 115 mg/dL — AB (ref 65–99)
GLUCOSE-CAPILLARY: 160 mg/dL — AB (ref 65–99)
Glucose-Capillary: 133 mg/dL — ABNORMAL HIGH (ref 65–99)

## 2015-04-24 LAB — BASIC METABOLIC PANEL
Anion gap: 10 (ref 5–15)
BUN: 12 mg/dL (ref 6–20)
CO2: 27 mmol/L (ref 22–32)
Calcium: 8.3 mg/dL — ABNORMAL LOW (ref 8.9–10.3)
Chloride: 96 mmol/L — ABNORMAL LOW (ref 101–111)
Creatinine, Ser: 0.91 mg/dL (ref 0.61–1.24)
GFR calc Af Amer: >60 mL/min (ref >60)
GFR calc non Af Amer: >60 mL/min (ref >60)
Glucose, Bld: 129 mg/dL — ABNORMAL HIGH (ref 65–99)
Potassium: 3.9 mmol/L (ref 3.5–5.1)
Sodium: 133 mmol/L — ABNORMAL LOW (ref 135–145)

## 2015-04-24 LAB — PROTIME-INR
INR: 1.63 — ABNORMAL HIGH (ref 0.00–1.49)
Prothrombin Time: 19.3 seconds — ABNORMAL HIGH (ref 11.6–15.2)

## 2015-04-24 MED ORDER — DIGOXIN 0.25 MG/ML IJ SOLN
0.2500 mg | Freq: Once | INTRAMUSCULAR | Status: AC
  Administered 2015-04-24: 0.25 mg via INTRAVENOUS
  Filled 2015-04-24 (×2): qty 1

## 2015-04-24 MED ORDER — DIGOXIN 0.25 MG/ML IJ SOLN
INTRAMUSCULAR | Status: AC
  Filled 2015-04-24: qty 2

## 2015-04-24 MED ORDER — AMIODARONE IV BOLUS ONLY 150 MG/100ML
INTRAVENOUS | Status: AC
  Filled 2015-04-24: qty 100

## 2015-04-24 MED ORDER — DIGOXIN 0.25 MG/ML IJ SOLN
0.5000 mg | Freq: Once | INTRAMUSCULAR | Status: AC
  Administered 2015-04-24: 0.5 mg via INTRAVENOUS

## 2015-04-24 MED ORDER — DIGOXIN 125 MCG PO TABS
0.1250 mg | ORAL_TABLET | Freq: Every day | ORAL | Status: DC
  Administered 2015-04-25: 0.125 mg via ORAL
  Filled 2015-04-24: qty 1

## 2015-04-24 MED ORDER — AMIODARONE IV BOLUS ONLY 150 MG/100ML
150.0000 mg | Freq: Once | INTRAVENOUS | Status: AC
  Administered 2015-04-24: 150 mg via INTRAVENOUS

## 2015-04-24 MED FILL — Heparin Sodium (Porcine) Inj 1000 Unit/ML: INTRAMUSCULAR | Qty: 30 | Status: AC

## 2015-04-24 MED FILL — Magnesium Sulfate Inj 50%: INTRAMUSCULAR | Qty: 10 | Status: AC

## 2015-04-24 MED FILL — Potassium Chloride Inj 2 mEq/ML: INTRAVENOUS | Qty: 40 | Status: AC

## 2015-04-24 NOTE — Progress Notes (Signed)
T[facility] BRIEF SICU PROGRESS NOTE  3 Days Post-Op  S/P Procedure(s) (LRB): AORTIC VALVE REPLACEMENT (AVR) (N/A) TRANSESOPHAGEAL ECHOCARDIOGRAM (TEE) (N/A)   Feels okay Afib w/ HR 100-105 BP stable O2 sats 96% on RA Diuresing well  Plan: Continue current plan  Purcell Nailslarence H Abenezer Odonell, MD 04/24/2015 6:32 PM

## 2015-04-24 NOTE — Progress Notes (Signed)
3 Days Post-Op Procedure(s) (LRB): AORTIC VALVE REPLACEMENT (AVR) (N/A) TRANSESOPHAGEAL ECHOCARDIOGRAM (TEE) (N/A) Subjective:  Sore but otherwise ok  Recurrent atrial fib/flutter with RVR 150's on cardizem drip and Pacerone.   Objective: Vital signs in last 24 hours: Temp:  [96 F (35.6 C)-100.6 F (38.1 C)] 99.9 F (37.7 C) (10/24 0738) Pulse Rate:  [47-125] 75 (10/24 0700) Cardiac Rhythm:  [-] Atrial fibrillation (10/23 2000) Resp:  [12-27] 23 (10/24 0700) BP: (86-142)/(52-82) 118/61 mmHg (10/24 0700) SpO2:  [90 %-100 %] 91 % (10/24 0700) Weight:  [92.4 kg (203 lb 11.3 oz)] 92.4 kg (203 lb 11.3 oz) (10/24 0300)  Hemodynamic parameters for last 24 hours:    Intake/Output from previous day: 10/23 0701 - 10/24 0700 In: 1654.3 [P.O.:1050; I.V.:604.3] Out: 1430 [Urine:1430] Intake/Output this shift:    General appearance: alert and cooperative Neurologic: intact Heart: regular rate and rhythm, S1, S2 normal, no murmur, click, rub or gallop Lungs: clear to auscultation bilaterally Extremities: extremities normal, atraumatic, no cyanosis or edema Wound: dressing dry  Lab Results:  Recent Labs  04/22/15 1700 04/22/15 1712 04/23/15 0319  WBC 16.5*  --  12.8*  HGB 11.9* 12.6* 11.3*  HCT 35.5* 37.0* 34.8*  PLT 112*  --  92*   BMET:  Recent Labs  04/22/15 0510  04/22/15 1712 04/23/15 0319  NA 132*  --  134* 134*  K 4.2  --  4.2 4.3  CL 99*  --  96* 99*  CO2 26  --   --  31  GLUCOSE 104*  --  121* 130*  BUN 12  --  15 11  CREATININE 1.02  < > 0.90 1.00  CALCIUM 7.6*  --   --  8.0*  < > = values in this interval not displayed.  PT/INR:  Recent Labs  08/02/14 1247  LABPROT 18.4*  INR 1.53*   ABG    Component Value Date/Time   PHART 7.327* 2015-03-10 1810   HCO3 24.8* 2015-03-10 1810   TCO2 25 04/22/2015 1712   ACIDBASEDEF 2.0 2015-03-10 1810   O2SAT 89.0 2015-03-10 1810   CBG (last 3)   Recent Labs  04/23/15 1516 04/23/15 1955 04/24/15 0006   GLUCAP 138* 131* 160*    Assessment/Plan: S/P Procedure(s) (LRB): AORTIC VALVE REPLACEMENT (AVR) (N/A) TRANSESOPHAGEAL ECHOCARDIOGRAM (TEE) (N/A)  He is hemodynamically stable.  Recurrent atrial fib/flutter with RVR which has been a problem for him in the past. Will continue Pacerone and resume digoxin since rate control is the issue now. Cardizem is not doing it and he has severe obstructive airway disease so beta blocker should be avoided and low dose beta blocker has not had much effect on his rhythm.  Xarelto resumed.  Continue bronchodilator, IS for severe COPD with reversible airway obstruction in this ongoing heavy smoker.  Mobilize.   LOS: 3 days    Alleen BorneBryan K Tiney Zipper 04/24/2015

## 2015-04-25 MED ORDER — ONDANSETRON HCL 4 MG/2ML IJ SOLN
4.0000 mg | Freq: Four times a day (QID) | INTRAMUSCULAR | Status: DC | PRN
  Administered 2015-04-27: 4 mg via INTRAVENOUS
  Filled 2015-04-25: qty 2

## 2015-04-25 MED ORDER — DILTIAZEM HCL 60 MG PO TABS
90.0000 mg | ORAL_TABLET | Freq: Four times a day (QID) | ORAL | Status: DC
  Administered 2015-04-25 – 2015-04-26 (×4): 90 mg via ORAL
  Filled 2015-04-25 (×10): qty 1

## 2015-04-25 MED ORDER — ONDANSETRON HCL 4 MG PO TABS: 4.0000 mg | ORAL_TABLET | Freq: Four times a day (QID) | ORAL | Status: DC | PRN

## 2015-04-25 MED ORDER — ACETAMINOPHEN 325 MG PO TABS: 650.0000 mg | ORAL_TABLET | Freq: Four times a day (QID) | ORAL | Status: DC | PRN

## 2015-04-25 MED ORDER — TRAMADOL HCL 50 MG PO TABS: 50.0000 mg | ORAL_TABLET | ORAL | Status: DC | PRN

## 2015-04-25 MED ORDER — ASPIRIN EC 81 MG PO TBEC
81.0000 mg | DELAYED_RELEASE_TABLET | Freq: Every day | ORAL | Status: DC
  Administered 2015-04-26 – 2015-04-27 (×2): 81 mg via ORAL
  Filled 2015-04-25 (×2): qty 1

## 2015-04-25 MED ORDER — SODIUM CHLORIDE 0.9 % IJ SOLN: 3.0000 mL | INTRAMUSCULAR | Status: DC | PRN

## 2015-04-25 MED ORDER — SODIUM CHLORIDE 0.9 % IJ SOLN
3.0000 mL | Freq: Two times a day (BID) | INTRAMUSCULAR | Status: DC
  Administered 2015-04-25 – 2015-04-28 (×5): 3 mL via INTRAVENOUS

## 2015-04-25 MED ORDER — FAMOTIDINE 20 MG PO TABS
20.0000 mg | ORAL_TABLET | Freq: Two times a day (BID) | ORAL | Status: DC
  Administered 2015-04-25 – 2015-04-27 (×5): 20 mg via ORAL
  Filled 2015-04-25 (×6): qty 1

## 2015-04-25 MED ORDER — SODIUM CHLORIDE 0.9 % IV SOLN
250.0000 mL | INTRAVENOUS | Status: DC | PRN
  Administered 2015-04-28: 250 mL via INTRAVENOUS

## 2015-04-25 MED ORDER — OXYCODONE HCL 5 MG PO TABS
5.0000 mg | ORAL_TABLET | ORAL | Status: DC | PRN
  Administered 2015-04-25 – 2015-04-26 (×7): 10 mg via ORAL
  Administered 2015-04-27: 5 mg via ORAL
  Filled 2015-04-25 (×9): qty 2

## 2015-04-25 MED ORDER — POTASSIUM CHLORIDE CRYS ER 20 MEQ PO TBCR
40.0000 meq | EXTENDED_RELEASE_TABLET | Freq: Once | ORAL | Status: AC
  Administered 2015-04-25: 40 meq via ORAL
  Filled 2015-04-25: qty 2

## 2015-04-25 MED ORDER — MOVING RIGHT ALONG BOOK
Freq: Once | Status: AC
  Administered 2015-04-25: 12:00:00
  Filled 2015-04-25: qty 1

## 2015-04-25 NOTE — Progress Notes (Signed)
CARDIAC REHAB PHASE I   PRE:  Rate/Rhythm: 91 SR  BP:  Sitting: 133/69        SaO2: 97 RA  MODE:  Ambulation: 150 ft   POST:  Rate/Rhythm: 106 ST  BP:  Sitting: 116/63         SaO2: 96 RA  Pt ambulated 150 ft on RA, declined use of walker, steady gait, tolerated well, declined to increase distance, states he "didn't want to walk in the first place." Pt did [name]/o DOE, states this is his baseline as he has COPD. Denies pain, dizziness, declined rest stop. Pt non-compliant with sternal precautions and when reminded to not use his arms pt responded "I'll use both of them thank you." Pt states he does not have 24 hr supervision for discharge and states he does not feel he is close to being ready for discharge and plans to appeal his discharge if discharged soon. Pt also states "there is no way I'm going to a nursing home." Pt to chair after walk, call bell within reach. Will follow.    [phone]  Donald [name], Donald Spinner C, RN, BSN 04/25/2015 1:34 PM

## 2015-04-25 NOTE — Hospital Discharge Follow-Up (Signed)
The patient has been seen at the [facility] at [facility]. Met with him this afternoon to discuss his plans for follow up after discharge; but he was very sleepy and requested that this CM return at at later time.   Update provided to Marvetta Gibbons, RN CM.

## 2015-04-25 NOTE — Progress Notes (Signed)
Utilization review completed.  

## 2015-04-25 NOTE — Progress Notes (Signed)
4 Days Post-Op Procedure(s) (LRB): AORTIC VALVE REPLACEMENT (AVR) (N/A) TRANSESOPHAGEAL ECHOCARDIOGRAM (TEE) (N/A) Subjective:  No complaints  Objective: Vital signs in last 24 hours: Temp:  [98.4 F (36.9 C)-99.2 F (37.3 C)] 98.4 F (36.9 C) (10/25 0747) Pulse Rate:  [52-152] 99 (10/25 0900) Cardiac Rhythm:  [-] Normal sinus rhythm (10/25 0900) Resp:  [11-41] 29 (10/25 0900) BP: (91-150)/(40-113) 121/72 mmHg (10/25 0900) SpO2:  [91 %-100 %] 94 % (10/25 0933) Weight:  [92.4 kg (203 lb 11.3 oz)-93.5 kg (206 lb 2.1 oz)] 93.5 kg (206 lb 2.1 oz) (10/25 0500)  Hemodynamic parameters for last 24 hours:    Intake/Output from previous day: 10/24 0701 - 10/25 0700 In: 1945 [P.O.:600; I.V.:1345] Out: 4800 [Urine:4800] Intake/Output this shift: Total I/O In: 50 [I.V.:50] Out: 175 [Urine:175]  General appearance: alert and cooperative Neurologic: intact Heart: regular rate and rhythm, S1, S2 normal, no murmur, click, rub or gallop Lungs: clear to auscultation bilaterally Extremities: extremities normal, atraumatic, no cyanosis or edema Wound: incision ok  Lab Results:  Recent Labs  04/23/15 0319 04/24/15 0449  WBC 12.8* 15.1*  HGB 11.3* 11.1*  HCT 34.8* 34.1*  PLT 92* 110*   BMET:  Recent Labs  04/23/15 0319 04/24/15 0449  NA 134* 133*  K 4.3 3.9  CL 99* 96*  CO2 31 27  GLUCOSE 130* 129*  BUN 11 12  CREATININE 1.00 0.91  CALCIUM 8.0* 8.3*    PT/INR:  Recent Labs  04/24/15 0449  LABPROT 19.3*  INR 1.63*   ABG    Component Value Date/Time   PHART 7.327* 07-Jun-2015 1810   HCO3 24.8* 07-Jun-2015 1810   TCO2 25 04/22/2015 1712   ACIDBASEDEF 2.0 07-Jun-2015 1810   O2SAT 89.0 07-Jun-2015 1810   CBG (last 3)   Recent Labs  04/24/15 0006 04/24/15 0423 04/24/15 0734  GLUCAP 160* 115* 133*    Assessment/Plan: S/P Procedure(s) (LRB): AORTIC VALVE REPLACEMENT (AVR) (N/A) TRANSESOPHAGEAL ECHOCARDIOGRAM (TEE) (N/A)  He remains hemodynamically stable  and is back in sinus rhythm this am. I rapid A-paced him last pm from a-flutter to a-fib. Will continue amiodarone, digoxin and convert cardizem to po. Will not use beta blocker in this patient with severe COPD.   Transfer to 2W and mobilize.  Check digoxin level tomorrow.   LOS: 4 days    Alleen BorneBryan K Linetta Regner 04/25/2015

## 2015-04-26 LAB — GLUCOSE, CAPILLARY: Glucose-Capillary: 143 mg/dL — ABNORMAL HIGH (ref 65–99)

## 2015-04-26 LAB — CBC
HEMATOCRIT: 35.6 % — AB (ref 39.0–52.0)
Hemoglobin: 11.7 g/dL — ABNORMAL LOW (ref 13.0–17.0)
MCH: 29.5 pg (ref 26.0–34.0)
MCHC: 32.9 g/dL (ref 30.0–36.0)
MCV: 89.7 fL (ref 78.0–100.0)
PLATELETS: 199 10*3/uL (ref 150–400)
RBC: 3.97 MIL/uL — ABNORMAL LOW (ref 4.22–5.81)
RDW: 13.4 % (ref 11.5–15.5)
WBC: 9.2 10*3/uL (ref 4.0–10.5)

## 2015-04-26 LAB — DIGOXIN LEVEL: Digoxin Level: 0.7 ng/mL — ABNORMAL LOW (ref 0.8–2.0)

## 2015-04-26 MED ORDER — CHLORHEXIDINE GLUCONATE 0.12 % MT SOLN
OROMUCOSAL | Status: AC
  Administered 2015-04-26: 15 mL
  Filled 2015-04-26: qty 15

## 2015-04-26 MED ORDER — METOPROLOL TARTRATE 25 MG PO TABS
25.0000 mg | ORAL_TABLET | Freq: Two times a day (BID) | ORAL | Status: DC
Start: 2015-04-26 — End: 2015-04-27
  Administered 2015-04-26 (×2): 25 mg via ORAL
  Filled 2015-04-26 (×2): qty 1

## 2015-04-26 MED ORDER — DILTIAZEM HCL 60 MG PO TABS
60.0000 mg | ORAL_TABLET | Freq: Four times a day (QID) | ORAL | Status: DC
  Administered 2015-04-26 – 2015-04-27 (×4): 60 mg via ORAL
  Filled 2015-04-26 (×4): qty 1

## 2015-04-26 MED ORDER — DIGOXIN 250 MCG PO TABS
0.2500 mg | ORAL_TABLET | Freq: Every day | ORAL | Status: DC
Start: 2015-04-26 — End: 2015-04-27
  Administered 2015-04-26 – 2015-04-27 (×2): 0.25 mg via ORAL
  Filled 2015-04-26 (×2): qty 2

## 2015-04-26 NOTE — Hospital Discharge Follow-Up (Signed)
[facility] at [place]:  Patient is known to the [facility] at [facility] and [facility]. Met with patient at bedside to determine his plans for follow-up after discharge. Patient indicated he planned to follow-up with his PCP Dr. Kathrynn Humble in [facility] after discharge. Patient declined follow-up at the [facility] [place] after discharge. Marvetta Gibbons, RN CM updated.

## 2015-04-26 NOTE — Progress Notes (Addendum)
      [name]       [place] [id number]             [phone]      5 Days Post-Op Procedure(s) (LRB): AORTIC VALVE REPLACEMENT (AVR) (N/A) TRANSESOPHAGEAL ECHOCARDIOGRAM (TEE) (N/A) Subjective: Feels sore in sternal incis  Objective: Vital signs in last 24 hours: Temp:  [98 F (36.7 C)-98.4 F (36.9 C)] 98.4 F (36.9 C) (10/26 0425) Pulse Rate:  [89-108] 108 (10/26 0425) Cardiac Rhythm:  [-] Atrial fibrillation (10/25 1930) Resp:  [14-29] 18 (10/26 0425) BP: (102-155)/(66-90) 155/90 mmHg (10/26 0425) SpO2:  [94 %-96 %] 95 % (10/26 0425) Weight:  [205 lb 7.5 oz (93.2 kg)] 205 lb 7.5 oz (93.2 kg) (10/26 0425)  Hemodynamic parameters for last 24 hours:    Intake/Output from previous day: 10/25 0701 - 10/26 0700 In: 530 [P.O.:480; I.V.:50] Out: 2375 [Urine:2375] Intake/Output this shift:    General appearance: alert, cooperative and no distress Heart: regular rate and rhythm and tachy, soft systolic murmur Lungs: clear to auscultation bilaterally Abdomen: benign Extremities: + LE edema Wound: incis healing well  Lab Results:  Recent Labs  04/24/15 0449 04/26/15 0250  WBC 15.1* 9.2  HGB 11.1* 11.7*  HCT 34.1* 35.6*  PLT 110* 199   BMET:  Recent Labs  04/24/15 0449  NA 133*  K 3.9  CL 96*  CO2 27  GLUCOSE 129*  BUN 12  CREATININE 0.91  CALCIUM 8.3*    PT/INR:  Recent Labs  04/24/15 0449  LABPROT 19.3*  INR 1.63*   ABG    Component Value Date/Time   PHART 7.327* 04/16/2015 1810   HCO3 24.8* 04/24/2015 1810   TCO2 25 04/22/2015 1712   ACIDBASEDEF 2.0 04/13/2015 1810   O2SAT 89.0 04/18/2015 1810   CBG (last 3)   Recent Labs  04/24/15 0006 04/24/15 0423 04/24/15 0734  GLUCAP 160* 115* 133*    Meds Scheduled Meds: . amiodarone  200 mg Oral Daily  . aspirin EC  81 mg Oral Daily  . atorvastatin  40 mg Oral Daily  . budesonide-formoterol  2 puff Inhalation BID  . chlorhexidine gluconate  15 mL Mouth Rinse BID   . digoxin  0.125 mg Oral Daily  . diltiazem  90 mg Oral 4 times per day  . famotidine  20 mg Oral BID  . rivaroxaban  20 mg Oral Q supper  . sodium chloride  3 mL Intravenous Q12H   Continuous Infusions:  PRN Meds:.sodium chloride, acetaminophen, guaiFENesin, ipratropium-albuterol, ondansetron **OR** ondansetron (ZOFRAN) IV, oxyCODONE, sodium chloride, traMADol  Xrays No results found.  Assessment/Plan: S/P Procedure(s) (LRB): AORTIC VALVE REPLACEMENT (AVR) (N/A) TRANSESOPHAGEAL ECHOCARDIOGRAM (TEE) (N/A)  1 doing well overall.  2 conts to be tachy at times to 120's on dig, amio, cardizem. Dig level 0.7, will increase to 0.25 daily. Consider beta blocker restart 3 on rivaroxiban for ac rx 4 push pulm toilet/rehab    LOS: 5 days    Donald [name] 04/26/2015   Chart reviewed, patient examined, agree with above. He is maintaining sinus but rate is around 100 at rest and up to 120 with ambulation. Will resume lopressor at 25 bid and decrease diltiazem to 60 every 6 hrs. His digoxin level was low so increased to .25.

## 2015-04-26 NOTE — Care Management Note (Signed)
Case Management Note Donn PieriniKristi Felecia Stanfill RN, BSN Unit 2W-Case Manager [phone]  Patient Details  Name: Donald ReichertLarry E [name] MRN: [id number] Date of Birth: May 01, 1961  Subjective/Objective:   Pt admitted s/p AVR                 Action/Plan: PTA pt lived at home with a friend- spoke with pt at bedside regarding d/c plans and needs- per conversation with pt- he states that he [place]- [place] to [place] there- reports that his friend is home most of the time does not drive- is disabled and receives PCS that helps them clean and cooks breakfast- pt states that he and his friend usually prepare the other meals- pt reports that they have other friends that can assist in going to the store to get groceries and such. Pt states that he does not feel like he will need any DME on discharge- [facility] feels like he needs them. Pt [place]. Pt states that he will not consider STSNF as he plans to return home.   Expected Discharge Date:   (pending)               Expected Discharge Plan:  Home/Self Care  In-House Referral:     Discharge planning Services  CM Consult  Post Acute Care Choice:    Choice offered to:     DME Arranged:    DME Agency:     HH Arranged:    HH Agency:     Status of Service:  In process, will continue to follow  Medicare Important Message Given:  Yes-second notification given Date Medicare IM Given:    Medicare IM give by:    Date Additional Medicare IM Given:    Additional Medicare Important Message give by:     If discussed at Long Length of Stay Meetings, dates discussed:  04/27/15  Additional Comments:  [name], Sharnae Winfree Hall, RN 04/26/2015, 3:05 PM

## 2015-04-26 NOTE — Care Management Important Message (Signed)
Important Message  Patient Details  Name: Donald ReichertLarry E [name] MRN: [id number] Date of Birth: 07-May-1961   Medicare Important Message Given:  Yes-second notification given    [name], [name] Hall, RN 04/26/2015, 3:05 PM

## 2015-04-26 NOTE — Progress Notes (Signed)
CARDIAC REHAB PHASE I   PRE:  Rate/Rhythm: 82 SR  BP:  Sitting: 140/92        SaO2: 96 RA  MODE:  Ambulation: 590 ft   POST:  Rate/Rhythm: 89 SR  BP:  Sitting: 136/92         SaO2: 97 RA  Pt ambulated 590 ft on RA, steady gait, tolerated well. Pt appears to have mild DOE, pt denies, denies dizziness, c/o of pain in his chest that has been ongoing today, pt states this is not incisional pain. Pt coughing, states it is painful when he coughs, used pillow to splint incision. Pt still does not adhere to general sternal precautions. Pt states " I can tell I am getting better every day." Encouraged pt to walk x2 more today, pt verbalized understanding. Pt to bed after walk, call bell within reach. Will continue to follow.   [phone] Joylene GrapesMonge, Graysin Luczynski C, RN, BSN 04/26/2015 12:46 PM

## 2015-04-27 ENCOUNTER — Inpatient Hospital Stay (HOSPITAL_COMMUNITY): Payer: Medicare HMO | Admitting: Certified Registered"

## 2015-04-27 ENCOUNTER — Inpatient Hospital Stay (HOSPITAL_COMMUNITY): Payer: Medicare HMO

## 2015-04-27 ENCOUNTER — Encounter (HOSPITAL_COMMUNITY): Payer: Self-pay

## 2015-04-27 ENCOUNTER — Other Ambulatory Visit: Payer: Self-pay

## 2015-04-27 DIAGNOSIS — Z954 Presence of other heart-valve replacement: Secondary | ICD-10-CM

## 2015-04-27 DIAGNOSIS — I2699 Other pulmonary embolism without acute cor pulmonale: Secondary | ICD-10-CM

## 2015-04-27 DIAGNOSIS — I469 Cardiac arrest, cause unspecified: Secondary | ICD-10-CM

## 2015-04-27 DIAGNOSIS — J9602 Acute respiratory failure with hypercapnia: Secondary | ICD-10-CM

## 2015-04-27 LAB — POCT I-STAT 3, ART BLOOD GAS (G3+)
ACID-BASE EXCESS: 2 mmol/L (ref 0.0–2.0)
BICARBONATE: 30.3 meq/L — AB (ref 20.0–24.0)
O2 SAT: 100 %
PCO2 ART: 71.7 mmHg — AB (ref 35.0–45.0)
PO2 ART: 429 mmHg — AB (ref 80.0–100.0)
TCO2: 32 mmol/L (ref 0–100)
pH, Arterial: 7.234 — ABNORMAL LOW (ref 7.350–7.450)

## 2015-04-27 LAB — PROTIME-INR
INR: 5.34 (ref 0.00–1.49)
Prothrombin Time: 47.6 seconds — ABNORMAL HIGH (ref 11.6–15.2)

## 2015-04-27 LAB — CBC
HEMATOCRIT: 29.4 % — AB (ref 39.0–52.0)
Hemoglobin: 9.8 g/dL — ABNORMAL LOW (ref 13.0–17.0)
MCH: 30.1 pg (ref 26.0–34.0)
MCHC: 33.3 g/dL (ref 30.0–36.0)
MCV: 90.2 fL (ref 78.0–100.0)
PLATELETS: 105 10*3/uL — AB (ref 150–400)
RBC: 3.26 MIL/uL — ABNORMAL LOW (ref 4.22–5.81)
RDW: 13.5 % (ref 11.5–15.5)
WBC: 20.3 10*3/uL — AB (ref 4.0–10.5)

## 2015-04-27 LAB — TROPONIN I: TROPONIN I: 0.52 ng/mL — AB (ref <0.031)

## 2015-04-27 LAB — LACTIC ACID, PLASMA: LACTIC ACID, VENOUS: 13.1 mmol/L — AB (ref 0.5–2.0)

## 2015-04-27 LAB — PREPARE RBC (CROSSMATCH)

## 2015-04-27 LAB — APTT: aPTT: 51 seconds — ABNORMAL HIGH (ref 24–37)

## 2015-04-27 MED ORDER — FAMOTIDINE IN NACL 20-0.9 MG/50ML-% IV SOLN
20.0000 mg | Freq: Two times a day (BID) | INTRAVENOUS | Status: DC
  Administered 2015-04-28 (×2): 20 mg via INTRAVENOUS
  Filled 2015-04-27 (×2): qty 50

## 2015-04-27 MED ORDER — DEXTROSE 50 % IV SOLN
1.0000 | Freq: Once | INTRAVENOUS | Status: AC
  Administered 2015-04-28: 50 mL via INTRAVENOUS

## 2015-04-27 MED ORDER — CEFAZOLIN SODIUM-DEXTROSE 2-3 GM-% IV SOLR
2.0000 g | Freq: Three times a day (TID) | INTRAVENOUS | Status: DC
  Administered 2015-04-28 (×2): 2 g via INTRAVENOUS
  Filled 2015-04-27 (×3): qty 50

## 2015-04-27 MED ORDER — NOREPINEPHRINE BITARTRATE 1 MG/ML IV SOLN
2.0000 ug/min | INTRAVENOUS | Status: DC
  Administered 2015-04-27: 20 ug/min via INTRAVENOUS
  Filled 2015-04-27: qty 4

## 2015-04-27 MED ORDER — SODIUM CHLORIDE 0.9 % IV SOLN
0.0000 mg/h | INTRAVENOUS | Status: DC
  Administered 2015-04-27: 2 mg/h via INTRAVENOUS
  Filled 2015-04-27 (×2): qty 10

## 2015-04-27 MED ORDER — NITROGLYCERIN 0.4 MG SL SUBL
SUBLINGUAL_TABLET | SUBLINGUAL | Status: AC
  Filled 2015-04-27: qty 2.5

## 2015-04-27 MED ORDER — AMIODARONE HCL IN DEXTROSE 360-4.14 MG/200ML-% IV SOLN
30.0000 mg/h | INTRAVENOUS | Status: DC
  Administered 2015-04-27: 30 mg/h via INTRAVENOUS
  Filled 2015-04-27 (×3): qty 200

## 2015-04-27 MED ORDER — LACTULOSE 10 GM/15ML PO SOLN
30.0000 g | Freq: Every day | ORAL | Status: DC | PRN
  Administered 2015-04-27: 30 g via ORAL
  Filled 2015-04-27 (×2): qty 45

## 2015-04-27 MED ORDER — MIDAZOLAM HCL 2 MG/2ML IJ SOLN
INTRAMUSCULAR | Status: AC
  Filled 2015-04-27: qty 2

## 2015-04-27 MED ORDER — FENTANYL CITRATE (PF) 100 MCG/2ML IJ SOLN
100.0000 ug | INTRAMUSCULAR | Status: DC | PRN
  Administered 2015-04-27 (×2): 100 ug via INTRAVENOUS
  Filled 2015-04-27 (×3): qty 2

## 2015-04-27 MED ORDER — IPRATROPIUM-ALBUTEROL 0.5-2.5 (3) MG/3ML IN SOLN: 3.0000 mL | RESPIRATORY_TRACT | Status: DC

## 2015-04-27 MED ORDER — METHYLPREDNISOLONE SODIUM SUCC 125 MG IJ SOLR
60.0000 mg | Freq: Two times a day (BID) | INTRAMUSCULAR | Status: DC
  Administered 2015-04-28 (×2): 60 mg via INTRAVENOUS
  Filled 2015-04-27: qty 2
  Filled 2015-04-27 (×2): qty 0.96

## 2015-04-27 MED ORDER — DIGOXIN 0.25 MG/ML IJ SOLN: 0.2500 mg | Freq: Every day | INTRAMUSCULAR | Status: DC

## 2015-04-27 MED ORDER — ALBUTEROL SULFATE (2.5 MG/3ML) 0.083% IN NEBU
2.5000 mg | INHALATION_SOLUTION | RESPIRATORY_TRACT | Status: DC
  Administered 2015-04-28 (×5): 2.5 mg via RESPIRATORY_TRACT
  Filled 2015-04-27 (×4): qty 3

## 2015-04-27 MED ORDER — BISACODYL 10 MG RE SUPP: 10.0000 mg | Freq: Every day | RECTAL | Status: DC | PRN

## 2015-04-27 MED ORDER — ALBUMIN HUMAN 5 % IV SOLN
25.0000 g | Freq: Once | INTRAVENOUS | Status: AC
Start: 2015-04-27 — End: 2015-04-28
  Administered 2015-04-28: 25 g via INTRAVENOUS

## 2015-04-27 MED ORDER — FUROSEMIDE 40 MG PO TABS
40.0000 mg | ORAL_TABLET | Freq: Once | ORAL | Status: AC
  Administered 2015-04-27: 40 mg via ORAL
  Filled 2015-04-27: qty 1

## 2015-04-27 MED ORDER — MIDAZOLAM HCL 2 MG/2ML IJ SOLN
2.0000 mg | INTRAMUSCULAR | Status: DC | PRN
  Filled 2015-04-27: qty 2

## 2015-04-27 MED ORDER — SODIUM CHLORIDE 0.9 % IV SOLN
0.0000 ug/h | INTRAVENOUS | Status: DC
  Administered 2015-04-27: 100 ug/h via INTRAVENOUS
  Filled 2015-04-27 (×2): qty 50

## 2015-04-27 MED ORDER — ALBUMIN HUMAN 5 % IV SOLN
INTRAVENOUS | Status: AC
  Administered 2015-04-28: 12.5 g via INTRAVENOUS
  Filled 2015-04-27: qty 500

## 2015-04-27 MED ORDER — POTASSIUM CHLORIDE CRYS ER 20 MEQ PO TBCR
40.0000 meq | EXTENDED_RELEASE_TABLET | Freq: Once | ORAL | Status: AC
  Administered 2015-04-27: 40 meq via ORAL
  Filled 2015-04-27: qty 2

## 2015-04-27 MED ORDER — INSULIN ASPART 100 UNIT/ML IV SOLN
10.0000 [IU] | Freq: Once | INTRAVENOUS | Status: AC
  Administered 2015-04-28: 10 [IU] via INTRAVENOUS

## 2015-04-27 MED ORDER — METOPROLOL TARTRATE 50 MG PO TABS
50.0000 mg | ORAL_TABLET | Freq: Two times a day (BID) | ORAL | Status: DC
  Administered 2015-04-27: 50 mg via ORAL
  Filled 2015-04-27 (×2): qty 1

## 2015-04-27 MED ORDER — FENTANYL CITRATE (PF) 100 MCG/2ML IJ SOLN: 100.0000 ug | INTRAMUSCULAR | Status: DC | PRN

## 2015-04-27 MED ORDER — ANTISEPTIC ORAL RINSE SOLUTION (CORINZ)
7.0000 mL | Freq: Four times a day (QID) | OROMUCOSAL | Status: DC
  Administered 2015-04-28 (×4): 7 mL via OROMUCOSAL

## 2015-04-27 MED ORDER — ALBUMIN HUMAN 5 % IV SOLN
12.5000 g | Freq: Once | INTRAVENOUS | Status: DC
  Administered 2015-04-28: 12.5 g via INTRAVENOUS

## 2015-04-27 MED ORDER — SODIUM POLYSTYRENE SULFONATE 15 GM/60ML PO SUSP
30.0000 g | Freq: Once | ORAL | Status: AC
  Administered 2015-04-28: 30 g
  Filled 2015-04-27: qty 120

## 2015-04-27 MED ORDER — MIDAZOLAM HCL 2 MG/2ML IJ SOLN
2.0000 mg | INTRAMUSCULAR | Status: DC | PRN
  Administered 2015-04-27 (×2): 2 mg via INTRAVENOUS
  Filled 2015-04-27: qty 2

## 2015-04-27 NOTE — Progress Notes (Signed)
CRITICAL VALUE ALERT  Critical value received:  K 7.5, trop 0.52  Date of notification:  04/27/15  Time of notification:  2351  Critical value read back:Yes.    Nurse who received alert:  Roselie AwkwardShannon Moritz Lever, RN  MD notified (1st page):  Dr. Laneta SimmersBartle at bedside  Time of first page:  2351

## 2015-04-27 NOTE — Progress Notes (Signed)
CRITICAL VALUE ALERT  Critical value received:  INR 5.34  Date of notification:  04/27/15  Time of notification:  2348  Critical value read back:Yes.    Nurse who received alert:  Roselie AwkwardShannon Kielee Care RN  MD notified (1st page):  Dr. Laneta SimmersBartle at bedside  Time of first page:  2348

## 2015-04-27 NOTE — Consult Note (Signed)
 PULMONARY / CRITICAL CARE MEDICINE   Name: Donald [name] MRN: [id number] DOB: [birth year]    ADMISSION DATE:   CONSULTATION DATE:  04/06/2015  REFERRING MD :  Laneta Simmers  CHIEF COMPLAINT:  Cardiac arrest  INITIAL PRESENTATION: 54 y/o male with a history of COPD who underwent an aortic valve replacement on 04/23/2015, had a cardiac arrest event on 04/27/2015 on the 2 west unit at [facility].     STUDIES:  10/27 CT angiogram chest >   SIGNIFICANT EVENTS: 10/21 Aortic valve replacement   HISTORY OF PRESENT ILLNESS:  54 y/o male with a history of PE, Afib, COPD underwent an aortic valve replacement on 10/21 at [facility].  His post operative course was uneventful but on 10/27 he developed abdominal cramping and constipation.  He also had his pacer wires removed on 10/27.  In the evening of 10/27 he developed worsening dyspnea.  He was given bronchodilators with some relief.  However his dyspnea rapidly progressed and he was transferred to the ICU by rapid response.  In the ICU he was initially conversant but then developed PEA arrest.  He had approximately 15 minutes of down time where he had asystole, PEA, Vfib, and Vtach.  All were treated with standard ACLS resuscitation.  He regained a pulse.  An ABG showed a respiratory acidosis with adequate oxygenation.  Xarelto was restarted around 10/24.  PAST MEDICAL HISTORY :   has a past medical history of Substance abuse; A-fib (HCC); Hypertension; Arthritis; COPD (chronic obstructive pulmonary disease) (HCC); Dysrhythmia; Shortness of breath dyspnea; GERD (gastroesophageal reflux disease); Constipation; PE (pulmonary embolism); Stroke [facility]) (02/27/15); Sleep apnea; Severe aortic stenosis; and Pneumonia.  has past surgical history that includes Tonsillectomy; Cardiac catheterization (N/A, 02/28/2015); Finger surgery (Left); Hand surgery (Left); Multiple extractions with alveoloplasty (N/A, 03/09/2015); Aortic valve replacement  (N/A, 04/12/2015); and TEE without cardioversion (N/A, 04/26/2015). Prior to Admission medications   Medication Sig Start Date End Date Taking? Authorizing Provider  albuterol (PROVENTIL) (2.5 MG/3ML) 0.083% nebulizer solution Take 3 mLs (2.5 mg total) by nebulization every 6 (six) hours as needed for wheezing or shortness of breath. 03/13/15  Yes Jaclyn Shaggy, MD  amiodarone (PACERONE) 200 MG tablet Take 1 tablet (200 mg total) by mouth daily. For Atrial fibrillation/Hypertrophic Cardiomyopathy 03/03/15  Yes Marinda Elk, MD  atorvastatin (LIPITOR) 40 MG tablet Take 1 tablet (40 mg total) by mouth daily. 03/03/15  Yes Marinda Elk, MD  budesonide-formoterol [facility]) 160-4.5 MCG/ACT inhaler Inhale 2 puffs into the lungs 2 (two) times daily.   Yes Historical Provider, MD  COMBIVENT RESPIMAT 20-100 MCG/ACT AERS respimat Take 1 puff by mouth every 6 (six) hours as needed for wheezing or shortness of breath.  02/01/15  Yes Historical Provider, MD  guaiFENesin (MUCINEX) 600 MG 12 hr tablet Take 600 mg by mouth 2 (two) times daily as needed for to loosen phlegm.   Yes Historical Provider, MD  metoprolol (LOPRESSOR) 50 MG tablet Take 50 mg by mouth 2 (two) times daily.   Yes Historical Provider, MD  XARELTO 20 MG TABS tablet Take 1 tablet (20 mg total) by mouth daily with supper. 03/03/15  Yes Marinda Elk, MD   No Known Allergies  FAMILY HISTORY:  indicated that his mother is deceased. He indicated that his father is deceased. He indicated that his sister is alive. He indicated that his brother is alive.  SOCIAL HISTORY:  reports that he has been smoking Cigarettes.  He has a 44 pack-year  smoking history. He has never used smokeless tobacco. He reports that he uses illicit drugs (Cocaine and "Crack" cocaine) about 7 times per week. He reports that he does not drink alcohol.  REVIEW OF SYSTEMS:  Could not obtain due to intubation  SUBJECTIVE:   VITAL SIGNS: Temp:  [97.3 F (36.3  C)-98.6 F (37 C)] 97.3 F (36.3 C) (10/27 2128) Pulse Rate:  [89-94] 89 (10/27 1745) Resp:  [18] 18 (10/27 1745) BP: (100-136)/(78-84) 106/83 mmHg (10/27 2128) SpO2:  [92 %-100 %] 92 % (10/27 2131) HEMODYNAMICS:   VENTILATOR SETTINGS:   INTAKE / OUTPUT:  Intake/Output Summary (Last 24 hours) at 04/27/15 2246 Last data filed at 04/27/15 1630  Gross per 24 hour  Intake    840 ml  Output   2250 ml  Net  -1410 ml    PHYSICAL EXAMINATION: General:  Cyanotic, on vent Neuro:  Reaches for tube, does not follow commands HEENT:  NCAT, ETT in place Cardiovascular:  Distant heart sounds, regular Lungs:  Poor air movement on vent, minimal wheeze Abdomen:  Distended abdomen, minimal bowel sounds Musculoskeletal:  Normal bulk and tone Skin:  Cyanotic, midline scar well healed but with some oozing.  LABS:  CBC  Recent Labs Lab 04/23/15 0319 04/24/15 0449 04/26/15 0250  WBC 12.8* 15.1* 9.2  HGB 11.3* 11.1* 11.7*  HCT 34.8* 34.1* 35.6*  PLT 92* 110* 199   Coag's  Recent Labs Lab 05/12/15 1247 04/24/15 0449  APTT 41*  --   INR 1.53* 1.63*   BMET  Recent Labs Lab 04/22/15 0510  04/22/15 1712 04/23/15 0319 04/24/15 0449  NA 132*  --  134* 134* 133*  K 4.2  --  4.2 4.3 3.9  CL 99*  --  96* 99* 96*  CO2 26  --   --  31 27  BUN 12  --  CREATININE 1.02  < > 0.90 1.00 0.91  GLUCOSE 104*  --  121* 130* 129*  < > = values in this interval not displayed. Electrolytes  Recent Labs Lab 05-12-15 1824 04/22/15 0510 04/22/15 1700 04/23/15 0319 04/24/15 0449  CALCIUM  --  7.6*  --  8.0* 8.3*  MG 2.9* 2.2 2.1  --   --    Sepsis Markers No results for input(s): LATICACIDVEN, PROCALCITON, O2SATVEN in the last 168 hours. ABG  Recent Labs Lab 2015-05-12 1704 May 12, 2015 1810 04/27/15 2234  PHART 7.343* 7.327* 7.234*  PCO2ART 45.6* 46.9* 71.7*  PO2ART 82.0 58.0* 429.0*   Liver Enzymes No results for input(s): AST, ALT, ALKPHOS, BILITOT, ALBUMIN in the  last 168 hours. Cardiac Enzymes No results for input(s): TROPONINI, PROBNP in the last 168 hours. Glucose  Recent Labs Lab 04/23/15 1516 04/23/15 1955 04/24/15 0006 04/24/15 0423 04/24/15 0734 04/26/15 2112  GLUCAP 138* 131* 160* 115* 133* 143*    Imaging Dg Abd Acute W/chest  04/27/2015  CLINICAL DATA:  Mid abdominal pain with shortness breath, nausea and vomiting today. Recent aortic valve replacement. Initial encounter. EXAM: DG ABDOMEN ACUTE W/ 1V CHEST COMPARISON:  Chest radiographs 04/24/2015 chest CT 02/02/2015 and abdominal CT 03/06/2014. FINDINGS: The heart size and mediastinal contours are stable status post median sternotomy. Bibasilar atelectasis is similar to the most recent radiographic examination with resulting obscuration of the nodular density previously demonstrated along the right hemidiaphragm. There may be a small amount of pleural fluid on the right. The lungs are otherwise clear. Old rib fractures are present on the left. The bowel gas  pattern is nonobstructive. There is moderate stool in the right colon and the stomach is fluid-filled. No suspicious abdominal calcifications. The bones appear unremarkable. IMPRESSION: Stable bibasilar atelectasis and probable mild abdominal ileus. No evidence of bowel obstruction or perforation. Electronically Signed   By: Carey BullocksWilliam  Veazey M.D.   On: 04/27/2015 13:17     ASSESSMENT / PLAN:  PULMONARY OETT  A: Acute respiratory failure with hypercapnea > presumably COPD exacerbation P:   Start low dose solumedrol Scheduled albuterol Full vent support Repeat ABG in one hour Daily CXR  CARDIOVASCULAR CVL L IJ CVL 10/27 >> A: Cardiac arrest 10/27 > presumably in setting of acute respiratory failure from hypoxemia; need to rule out PE, ruptured aortic valve S/P aortic valve replacement 10/21 Afib, chronic P:  Echocardiogram Stat CT angiogram chest Levophed as needed for MAP > 65   RENAL A:  No acute issues P:    Gentle IVF Monitor BMET and UOP Replace electrolytes as needed   GASTROINTESTINAL A:  Abdominal distension, constipation P:   Place NG tube for decompression  HEMATOLOGIC A:  History of PE P:  Hold Xarelto F/u CT angiogram  INFECTIOUS A:  No acute infectious issues P:   Start cefazolin for surgical wound prophylaxis after CPR  ENDOCRINE A:  No acute issues   P:   Monitor glucose  NEUROLOGIC A:  S/P cardiac arrest, making purposeful movements P:   RASS goal: -1 Intermittent fentanyl, intermittent versed   FAMILY  - Updates: none immediate  My cc time 60 minutes    Heber CarolinaBrent McQuaid, MD [facility] Pager: [phone] Cell: [phone] After 3pm or if no response, call [phone]

## 2015-04-27 NOTE — Progress Notes (Signed)
EPWs pulled per protocol.  No ectopy or other problems noted at this time.  Instructed about need for 1 hour of bedrest with VS monitoring.  Will continue to monitor.

## 2015-04-27 NOTE — Progress Notes (Signed)
CARDIAC REHAB PHASE I   Spoke with pt RN who states pt not feeling well today, just returned from xray to r/o ileus. Went to check with pt, pt lying in bed asleep. Will follow up this afternoon as schedule permits. RN states pt unlikely to discharge today, RN to page cardiac rehab if pt to discharge today (before 3:30) in order to review discharge education.   Joylene GrapesMonge, Tajon Moring C, RN, BSN 04/27/2015 1:33 PM

## 2015-04-27 NOTE — Anesthesia Postprocedure Evaluation (Signed)
OOD intubation 

## 2015-04-27 NOTE — Transfer of Care (Signed)
OOD intubaton

## 2015-04-27 NOTE — Discharge Summary (Signed)
[address]       [place] [address]             [phone]              Death Summary  Name: Donald [name] DOB: [birth year] 54 y.o. MRN: [id number]   Admission Date: 2015-05-18 Discharge Date: 04/30/2015    Admitting Diagnosis: Severe aortic stenosis   Discharge Diagnosis:  Severe aortic stenosis Postoperative atrial fibrillation/flutter Expected postoperative blood loss anemia Asystolic cardiac arrest Acute respiratory failure Acute anuric renal failure Multisystem organ failure Severe metabolic acidosis Cardiogenic shock Shock liver  Patient Active Problem List   Diagnosis Date Noted  . Shock circulatory (HCC) 04/21/2015  . Acute renal failure (ARF) (HCC) 04/25/2015  . DIC (disseminated intravascular coagulation) (HCC) 04/02/2015  . Cardiac arrest (HCC)   . S/P AVR 2015-05-18  . COPD (chronic obstructive pulmonary disease) (HCC) 03/13/2015  . Chronic periodontitis 03/09/2015  . Severe aortic valve stenosis 03/09/2015  . Caries   . Stroke with cerebral ischemia (HCC)   . Polysubstance abuse 02/28/2015  . Chronic anticoagulation-Xarelto 02/28/2015  . Non compliance w medication regimen 02/28/2015  . TIA (transient ischemic attack) 02/27/2015  . COPD exacerbation (HCC) 02/27/2015  . Tobacco abuse 02/27/2015  . Chronic arthritis 02/27/2015  . Benign essential HTN 02/27/2015  . NSTEMI (non-ST elevated myocardial infarction) (HCC)   . Severe aortic stenosis   . Acute encephalopathy 02/03/2015  . SOB (shortness of breath) 02/03/2015  . Elevated troponin 02/03/2015  . History of pulmonary embolism 02/03/2015  . Chronic atrial fibrillation (HCC) 02/03/2015  . Aortic stenosis 02/03/2015  . Chest pain 02/02/2015  . Cocaine dependence (HCC) 07/30/2012  . Alcohol dependence (HCC) 07/30/2012     Past Medical History  Diagnosis Date  . Substance abuse   . A-fib (HCC)   . Hypertension   . Arthritis   . COPD (chronic  obstructive pulmonary disease) (HCC)   . Dysrhythmia     Afib  . Shortness of breath dyspnea     with exertion  . GERD (gastroesophageal reflux disease)   . Constipation   . PE (pulmonary embolism)     11/24/14 right PE (HPR)  . Stroke (HCC) 02/27/15     TIA 02/27/15; no residual  . Sleep apnea   . Severe aortic stenosis   . Pneumonia       Procedures: AORTIC VALVE REPLACEMENT - 05-18-15  23 mm [facility] Ease pericardial tissue valve    HPI:  The patient is a 54 y.o. male with a long history of smoking, prior ETOH abuse in [place], crack cocaine abuse, who was admitted in August with new onset of left facial droop and weakness, slurred speech, left arm weakness and numbness. It lasted about 20 seconds and resolved. CT of the brain in the ED was negative. MRA of the head showed mild intracranial atherosclerotic changes without significant vessel occlusion and no acute infarct. Carotid dopplers were normal. He also has a history of pulmonary embolism treated at [facility] 2 months ago and says that he was on Xarelto for this as well as chronic atrial fibrillation on amiodarone, but he moved and lost his medication, so he was not taking the Xarelto. He was apparently in sinus rhythm when seen in clinic 12/17/2014. He has a history of known aortic stenosis followed by Dr. Dot Been at [facility].  He was admitted to [facility] from 8/4-8/6 for acute encephalopathy felt to be  due to polysubstance abuse with positive urine drug screen for benzos and cocaine. His troponin was positive at 0.18>0.20. An echo on 02/03/2015 showed a mean AV gradient of 52 mm Hg with a peak of 81 mm Hg. The aortic valve appeared trileaflet, calcified with poor leaflet mobility. The EF was 55%.   At the time of this admission, his troponin POC was 0.29 with a Troponin I of 0.23. A repeat echo was performed on 02/27/2015 with poor visualization of the AV and a mean gradient of only 35 mm Hg. He underwent cath on 02/18/2015  that showed normal coronaries with a mean gradient of 47 mm Hg and a peak of 57 mm Hg with a CO of 5.7 L/min.   He was seen by dentistry and underwent multiple extractions for poor dentition on 03/09/2015. Dr. Laneta Simmers saw him in the office on 03/22/2015 and he was healing well and back to work. He has continued to smoke. He was scheduled for AVR on 04/14/2015 but he returned to see his primary physician prior to surgery, and it was thought that he was having a flare of bronchitis and that we should delay surgery. He has been treated and returned to see Dr. Reche Dixon who thought that he was ready for surgery. All risks, benefits and alternatives of surgery were explained in detail, and the patient agreed to proceed.     Hospital Course:  The patient was admitted to [facility] on 2015-05-16. The patient was taken to the operating room and underwent the above procedure.    The postoperative course was notable for rapid atrial fibrillation which occurred on postop day 2.  He was started on Amiodarone initially, but did require Cardizem for better rate control.  Xarelto was restarted. He continued to have recurrent Afib/flutter, and Digoxin was also restarted for rate control, since his pulmonary function was poor for beta blocker therapy. He ultimately was rapid atrial paced back into sinus rhythm. He remained in sinus but heart rates remained elevated, so a low dose beta blocker was initiated and titrated as needed.   Late in the day on postop day 6, the patient developed nausea, vomiting and abdominal pain. KUB at that time showed a lot of stool throughout the colon, maybe early ileus, and CXR showed bilateral basilar atelectasis. He was given lactulose and had a couple BM's with improvement. That evening, he developed respiratory distress with decreased sats into the 80's and his oxygen requirement increased to 10L. He was seen by Rapid Response and given bronchodilator with improvement and transferred to ICU.  Upon arrival, he was awake, but then had asystolic arrest. He was resuscitated by the CCM team with CPR, shocked 3 times, with return of good BP over 200 due to epinephrine, pulse of 90's in sinus rhythm. He was intubated and placed on the ventilator. Multiple pressors were initiated to maintain adequate blood pressure. EKG showed inferior ST elevation with troponin of 0.5.  Creatinine bumped up to 3.88 with potassium of 7.1 and elevation of liver enzymes. Abdominal exam showed distention, but abdomen was soft. Nephrology was consulted and emergent CVVHD was initiated. Cardiology was consulted and a stat echocardiogram was performed showing EF 50-55%, diastolic dysfunction, no AI and low gradient across the aortic valve, moderate right ventricular dysfunction, with moderate pericardial effusion at the apex and no obvious signs of tamponade. A bedside TEE was performed that showed a well functioning bioprosthetic aortic valve with no AI or paravalvular leak, no obvious aortic dissection. LVEF  estimated at 35-40% with inferior and inferolateral hypokinesis. During the TEE, the patient developed rapid atrial fibrillation with worsening hypotension and required emergent cardioversion. He was returned to sinus rhythm with ectopy, and was started on IV Amiodarone.  The patient remained profoundly acidotic despite CVVHD and attempts at correction with bicarb drip. It was felt that the patient would have no meaningful chance at survival if he developed cardiac arrest, therefore he was made a DNR.  He remained on maximal support, but his condition continued to deteriorate. The patient expired on 02/15/2015 at 1652.     Recent vital signs:  Filed Vitals:   10-03-2014 1645  BP:   Pulse: 69  Temp:   Resp: 28    Recent laboratory studies:  CBC:No results for input(s): WBC, HGB, HCT, PLT in the last 72 hours. BMET: No results for input(s): NA, K, CL, CO2, GLUCOSE, BUN, CREATININE, CALCIUM in the last 72 hours.    PT/INR: No results for input(s): LABPROT, INR in the last 72 hours.    Sahaana Weitman H 05/02/2015, 9:40 AM

## 2015-04-27 NOTE — Progress Notes (Signed)
ANTIBIOTIC CONSULT NOTE - INITIAL  Pharmacy Consult for cefazolin Indication: surgical wound prophylaxis  No Known Allergies  Patient Measurements: Height:  (180.3 cm) Weight: 205 lb 7.5 oz (93.2 kg) IBW/kg (Calculated) : 75.3 Adjusted Body Weight:   Vital Signs: Temp: 97.3 F (36.3 C) (10/27 2128) Temp Source: Oral (10/27 1745) BP: 106/83 mmHg (10/27 2128) Pulse Rate: 89 (10/27 1745) Intake/Output from previous day: 10/26 0701 - 10/27 0700 In: 240 [P.O.:240] Out: 500 [Urine:500] Intake/Output from this shift:    Labs:  Recent Labs  04/26/15 0250  WBC 9.2  HGB 11.7*  PLT 199   Estimated Creatinine Clearance: 105.8 mL/min (by C-G formula based on Cr of 0.91). No results for input(s): VANCOTROUGH, VANCOPEAK, VANCORANDOM, GENTTROUGH, GENTPEAK, GENTRANDOM, TOBRATROUGH, TOBRAPEAK, TOBRARND, AMIKACINPEAK, AMIKACINTROU, AMIKACIN in the last 72 hours.   Microbiology: Recent Results (from the past 720 hour(s))  Surgical pcr screen     Status: Abnormal   Collection Time: 04/19/15 11:58 AM  Result Value Ref Range Status   MRSA, PCR POSITIVE (A) NEGATIVE Final   Staphylococcus aureus POSITIVE (A) NEGATIVE Final    Comment:        The Xpert SA Assay (FDA approved for NASAL specimens in patients over 72 years of age), is one component of a comprehensive surveillance program.  Test performance has been validated by [facility] for patients greater than or equal to 16 year old. It is not intended to diagnose infection nor to guide or monitor treatment.     Medical History: Past Medical History  Diagnosis Date  . Substance abuse   . A-fib (HCC)   . Hypertension   . Arthritis   . COPD (chronic obstructive pulmonary disease) (HCC)   . Dysrhythmia     Afib  . Shortness of breath dyspnea     with exertion  . GERD (gastroesophageal reflux disease)   . Constipation   . PE (pulmonary embolism)     11/24/14 right PE (HPR)  . Stroke (HCC) 02/27/15     TIA  02/27/15; no residual  . Sleep apnea   . Severe aortic stenosis   . Pneumonia     Medications:  Prescriptions prior to admission  Medication Sig Dispense Refill Last Dose  . albuterol (PROVENTIL) (2.5 MG/3ML) 0.083% nebulizer solution Take 3 mLs (2.5 mg total) by nebulization every 6 (six) hours as needed for wheezing or shortness of breath. 75 mL 3 2015-05-02 at 0445  . amiodarone (PACERONE) 200 MG tablet Take 1 tablet (200 mg total) by mouth daily. For Atrial fibrillation/Hypertrophic Cardiomyopathy 30 tablet 0 2015/05/02 at 0620  . atorvastatin (LIPITOR) 40 MG tablet Take 1 tablet (40 mg total) by mouth daily. 30 tablet 3 Past Week at Unknown time  . budesonide-formoterol (SYMBICORT) 160-4.5 MCG/ACT inhaler Inhale 2 puffs into the lungs 2 (two) times daily.   2015/05/02 at 0445  . COMBIVENT RESPIMAT 20-100 MCG/ACT AERS respimat Take 1 puff by mouth every 6 (six) hours as needed for wheezing or shortness of breath.    05/02/15 at 0445  . guaiFENesin (MUCINEX) 600 MG 12 hr tablet Take 600 mg by mouth 2 (two) times daily as needed for to loosen phlegm.   Past Week at Unknown time  . metoprolol (LOPRESSOR) 50 MG tablet Take 50 mg by mouth 2 (two) times daily.   May 02, 2015 at 0620  . XARELTO 20 MG TABS tablet Take 1 tablet (20 mg total) by mouth daily with supper. 30 tablet 0 Past Month at Unknown time  Assessment: 54 yo amel s/p AVR on 10/21, s/p cardiac arrest tonight, initiating Ancef for surgical wound prophylaxis s/p CPR. SCr 10/24 0.9, eCrCl> 80 ml/min.  Goal of Therapy:  Resolution of infection  Plan:  -Cefazolin 2 g IV q8h -Monitor renal fx, duration of therapy   Agapito GamesAlison Lyndal Reggio, PharmD, BCPS Clinical Pharmacist Pager: [phone] 04/27/2015 11:23 PM

## 2015-04-27 NOTE — Code Documentation (Signed)
  Patient Name: Donald ReichertLarry E [name]   MRN: [id number]   Date of Birth/ Sex: 13-Jun-1961 , male      Admission Date: 04/19/2015  Attending Provider: No att. providers found  Primary Diagnosis: Severe Aortic Stenosis s/p AVR   Indication: Pt was in his usual state of health until this PM, when he was noted to be unreponsive. Code blue was subsequently called. At the time of arrival on scene, ACLS protocol was underway.   Technical Description:  - CPR performance duration:  15  minutes  - Was defibrillation or cardioversion used? Yes   - Was external pacer placed? No  - Was patient intubated pre/post CPR? Yes   Medications Administered: Y = Yes; Blank = No Amiodarone    Atropine    Calcium    Epinephrine  Y  Lidocaine    Magnesium    Norepinephrine    Phenylephrine    Sodium bicarbonate  Y  Vasopressin     Post CPR evaluation:  - Final Status - Was patient successfully resuscitated ? Yes - What is current rhythm? Sinus tachycardia - What is current hemodynamic status? Stable  Miscellaneous Information:  - Labs sent, including: ABG, CBC, INR, Lactic Acid, BMET, Troponin I, APTT  - Primary team notified?  Yes  - Family Notified? Yes        Ruben ImJeremy Belma Dyches, MD  04/27/2015, 10:50 PM

## 2015-04-27 NOTE — Progress Notes (Addendum)
      [address]       [place], [id number]             [phone]      6 Days Post-Op Procedure(s) (LRB): AORTIC VALVE REPLACEMENT (AVR) (N/A) TRANSESOPHAGEAL ECHOCARDIOGRAM (TEE) (N/A) Subjective: Feels pretty well  Objective: Vital signs in last 24 hours: Temp:  [98.2 F (36.8 C)-98.7 F (37.1 C)] 98.6 F (37 C) (10/27 0707) Pulse Rate:  [82-101] 94 (10/27 0707) Cardiac Rhythm:  [-] Normal sinus rhythm;Bundle branch block (10/26 1927) Resp:  [18-19] 18 (10/27 0707) BP: (113-136)/(74-88) 136/79 mmHg (10/27 0707) SpO2:  [94 %-100 %] 100 % (10/27 0707)  Hemodynamic parameters for last 24 hours:    Intake/Output from previous day: 10/26 0701 - 10/27 0700 In: 240 [P.O.:240] Out: 500 [Urine:500] Intake/Output this shift: Total I/O In: -  Out: 600 [Urine:600]  General appearance: alert, cooperative and no distress Heart: regular rate and rhythm Lungs: clear to auscultation bilaterally Abdomen: benign Extremities: minor LE wdema Wound: incis healing well  Lab Results:  Recent Labs  04/26/15 0250  WBC 9.2  HGB 11.7*  HCT 35.6*  PLT 199   BMET: No results for input(s): NA, K, CL, CO2, GLUCOSE, BUN, CREATININE, CALCIUM in the last 72 hours.  PT/INR: No results for input(s): LABPROT, INR in the last 72 hours. ABG    Component Value Date/Time   PHART 7.327* 04/10/2015 1810   HCO3 24.8* 04/01/2015 1810   TCO2 25 04/22/2015 1712   ACIDBASEDEF 2.0 04/27/2015 1810   O2SAT 89.0 04/18/2015 1810   CBG (last 3)   Recent Labs  04/26/15 2112  GLUCAP 143*    Meds Scheduled Meds: . amiodarone  200 mg Oral Daily  . aspirin EC  81 mg Oral Daily  . atorvastatin  40 mg Oral Daily  . budesonide-formoterol  2 puff Inhalation BID  . chlorhexidine gluconate  15 mL Mouth Rinse BID  . digoxin  0.25 mg Oral Daily  . diltiazem  60 mg Oral 4 times per day  . famotidine  20 mg Oral BID  . metoprolol tartrate  25 mg Oral BID  . rivaroxaban  20 mg Oral  Q supper  . sodium chloride  3 mL Intravenous Q12H   Continuous Infusions:  PRN Meds:.sodium chloride, acetaminophen, guaiFENesin, ipratropium-albuterol, ondansetron **OR** ondansetron (ZOFRAN) IV, oxyCODONE, sodium chloride, traMADol  Xrays No results found.  Assessment/Plan: S/P Procedure(s) (LRB): AORTIC VALVE REPLACEMENT (AVR) (N/A) TRANSESOPHAGEAL ECHOCARDIOGRAM (TEE) (N/A)  1 HR is improved, mostly SR with some sinus tach to low 100's 2 d/c wires this am 3  He says he has nobody who can drive him except brother who may be able, otherwise plans to drive home which I discouraged.   LOS: 6 days    GOLD,WAYNE E 04/27/2015   Chart reviewed, patient examined, agree with above. He is doing well overall and maintaining sinus. Will increase his lopressor to 50 bid as preop and stop cardizem. Continue digoxin and amio.   His weight is still 4 lbs over preop and he has mild pedal edema remaining. I and O have been negative without lasix. Will give him one dose today.   I think he could go home later if he can arrange transportation. He has a poor social situation and is basically on his own with minimal help from a friend. He refuses to go to SNF.

## 2015-04-27 NOTE — Progress Notes (Signed)
6 Days Post-Op Procedure(s) (LRB): AORTIC VALVE REPLACEMENT (AVR) (N/A) TRANSESOPHAGEAL ECHOCARDIOGRAM (TEE) (N/A) Subjective:  He developed some nausea and abdominal pain and vomited once earlier today. KUB at that time showed a lot of stool throughout the colon, maybe early ileus, and CXR showed bilateral basilar atelectasis. He was given lactulose and had a couple BM's with improvement. This evening he developed respiratory distress with decreased sats into the 80's and his oxygen requirement increased to 10L. He was seen by Rapid Response and given bronchodilator with improvement and transferred to ICU. Upon arrival he was awake but then had asystolic arrest. He was resuscitated by the CCM team here with CPR, shocked 3 times, with return of good BP over 200 due to epinephrine, pulse of 90's in sinus rhythm.  Objective: Vital signs in last 24 hours: Temp:  [97.3 F (36.3 C)-98.6 F (37 C)] 97.3 F (36.3 C) (10/27 2128) Pulse Rate:  [89-94] 89 (10/27 1745) Cardiac Rhythm:  [-] Normal sinus rhythm (10/27 1940) Resp:  [18] 18 (10/27 1745) BP: (100-136)/(78-84) 106/83 mmHg (10/27 2128) SpO2:  [92 %-100 %] 92 % (10/27 2131)  Hemodynamic parameters for last 24 hours:    Intake/Output from previous day: 10/26 0701 - 10/27 0700 In: 240 [P.O.:240] Out: 500 [Urine:500] Intake/Output this shift:    General appearance: mottled on arrival but color good now. Neurologic: moving all ext on his own now. Heart: regular rate and rhythm, S1, S2 normal, no murmur, click, rub or gallop Lungs: clear to auscultation bilaterally Abdomen: no BS, soft, mildly distended Extremities: extremities normal, atraumatic, no cyanosis or edema Wound: some bleeding from the separated skin edges in the upper portion of the incision from CPR.  Lab Results:  Recent Labs  04/26/15 0250  WBC 9.2  HGB 11.7*  HCT 35.6*  PLT 199   BMET: No results for input(s): NA, K, CL, CO2, GLUCOSE, BUN, CREATININE, CALCIUM  in the last 72 hours.  PT/INR: No results for input(s): LABPROT, INR in the last 72 hours. ABG    Component Value Date/Time   PHART 7.234* 04/27/2015 2234   HCO3 30.3* 04/27/2015 2234   TCO2 32 04/27/2015 2234   ACIDBASEDEF 2.0 04/22/2015 1810   O2SAT 100.0 04/27/2015 2234   CBG (last 3)   Recent Labs  04/26/15 2112  GLUCAP 143*   CXR: lungs clear, no pleural effusion, mediastinum unremarkable, no ptx  Assessment/Plan: S/P Procedure(s) (LRB): AORTIC VALVE REPLACEMENT (AVR) (N/A) TRANSESOPHAGEAL ECHOCARDIOGRAM (TEE) (N/A)  Asystolic arrest tonight that followed acute respiratory failure in this patient with severe COPD and active smoking. His lungs look clear on CXR after intubation. He may have had respiratory decline due to abdominal distension with atelectasis. He does have a history of PE in the past and was on Xarelto for that as well as atrial fibrillation. Will get a CT pulmonary angio to rule out PE and to assess for mediastinal hematoma and pericardial effusion. He did have pacing wires removed earlier today but was stable after that. He has been back on Xarelto for the past 4 days.   LOS: 6 days    Alleen BorneBryan K Bartle 04/27/2015

## 2015-04-28 ENCOUNTER — Inpatient Hospital Stay (HOSPITAL_COMMUNITY): Payer: Medicare HMO

## 2015-04-28 ENCOUNTER — Other Ambulatory Visit: Payer: Self-pay

## 2015-04-28 DIAGNOSIS — N170 Acute kidney failure with tubular necrosis: Secondary | ICD-10-CM

## 2015-04-28 DIAGNOSIS — I4901 Ventricular fibrillation: Secondary | ICD-10-CM

## 2015-04-28 DIAGNOSIS — D65 Disseminated intravascular coagulation [defibrination syndrome]: Secondary | ICD-10-CM

## 2015-04-28 DIAGNOSIS — R579 Shock, unspecified: Secondary | ICD-10-CM

## 2015-04-28 DIAGNOSIS — I469 Cardiac arrest, cause unspecified: Secondary | ICD-10-CM | POA: Insufficient documentation

## 2015-04-28 DIAGNOSIS — I2111 ST elevation (STEMI) myocardial infarction involving right coronary artery: Secondary | ICD-10-CM

## 2015-04-28 DIAGNOSIS — I350 Nonrheumatic aortic (valve) stenosis: Secondary | ICD-10-CM

## 2015-04-28 DIAGNOSIS — N179 Acute kidney failure, unspecified: Secondary | ICD-10-CM

## 2015-04-28 LAB — CARBOXYHEMOGLOBIN
CARBOXYHEMOGLOBIN: 0.6 % (ref 0.5–1.5)
Carboxyhemoglobin: 0.6 % (ref 0.5–1.5)
Carboxyhemoglobin: 0.5 % (ref 0.5–1.5)
METHEMOGLOBIN: 1.2 % (ref 0.0–1.5)
METHEMOGLOBIN: 0.9 % (ref 0.0–1.5)
Methemoglobin: 1.1 % (ref 0.0–1.5)
O2 SAT: 50.7 %
O2 Saturation: 84.3 %
O2 Saturation: 43.4 %
Total hemoglobin: 8.8 g/dL — ABNORMAL LOW (ref 13.5–18.0)
Total hemoglobin: 7.9 g/dL — ABNORMAL LOW (ref 13.5–18.0)
Total hemoglobin: 7.4 g/dL — ABNORMAL LOW (ref 13.5–18.0)

## 2015-04-28 LAB — POCT I-STAT 3, ART BLOOD GAS (G3+)
ACID-BASE DEFICIT: 14 mmol/L — AB (ref 0.0–2.0)
ACID-BASE DEFICIT: 15 mmol/L — AB (ref 0.0–2.0)
ACID-BASE DEFICIT: 18 mmol/L — AB (ref 0.0–2.0)
ACID-BASE DEFICIT: 8 mmol/L — AB (ref 0.0–2.0)
Acid-base deficit: 2 mmol/L (ref 0.0–2.0)
Acid-base deficit: 15 mmol/L — ABNORMAL HIGH (ref 0.0–2.0)
BICARBONATE: 12.4 meq/L — AB (ref 20.0–24.0)
BICARBONATE: 12.3 meq/L — AB (ref 20.0–24.0)
BICARBONATE: 13.2 meq/L — AB (ref 20.0–24.0)
BICARBONATE: 10.3 meq/L — AB (ref 20.0–24.0)
Bicarbonate: 20.1 mEq/L (ref 20.0–24.0)
Bicarbonate: 23.4 mEq/L (ref 20.0–24.0)
O2 SAT: 99 %
O2 SAT: 99 %
O2 Saturation: 100 %
O2 Saturation: 100 %
O2 Saturation: 98 %
O2 Saturation: 99 %
PCO2 ART: 41.5 mmHg (ref 35.0–45.0)
PCO2 ART: 36.9 mmHg (ref 35.0–45.0)
PH ART: 7.361 (ref 7.350–7.450)
PH ART: 7.193 — AB (ref 7.350–7.450)
PH ART: 7.255 — AB (ref 7.350–7.450)
PH ART: 7.12 — AB (ref 7.350–7.450)
PO2 ART: 207 mmHg — AB (ref 80.0–100.0)
PO2 ART: 205 mmHg — AB (ref 80.0–100.0)
Patient temperature: 99
Patient temperature: 97.9
Patient temperature: 96.5
TCO2: 11 mmol/L (ref 0–100)
TCO2: 13 mmol/L (ref 0–100)
TCO2: 13 mmol/L (ref 0–100)
TCO2: 14 mmol/L (ref 0–100)
TCO2: 22 mmol/L (ref 0–100)
TCO2: 25 mmol/L (ref 0–100)
pCO2 arterial: 27.7 mmHg — ABNORMAL LOW (ref 35.0–45.0)
pCO2 arterial: 30.8 mmHg — ABNORMAL LOW (ref 35.0–45.0)
pCO2 arterial: 32.1 mmHg — ABNORMAL LOW (ref 35.0–45.0)
pCO2 arterial: 51.3 mmHg — ABNORMAL HIGH (ref 35.0–45.0)
pH, Arterial: 7.156 — CL (ref 7.350–7.450)
pH, Arterial: 7.2 — ABNORMAL LOW (ref 7.350–7.450)
pO2, Arterial: 126 mmHg — ABNORMAL HIGH (ref 80.0–100.0)
pO2, Arterial: 164 mmHg — ABNORMAL HIGH (ref 80.0–100.0)
pO2, Arterial: 176 mmHg — ABNORMAL HIGH (ref 80.0–100.0)
pO2, Arterial: 243 mmHg — ABNORMAL HIGH (ref 80.0–100.0)

## 2015-04-28 LAB — COMPREHENSIVE METABOLIC PANEL
ALBUMIN: 2.4 g/dL — AB (ref 3.5–5.0)
ALK PHOS: 118 U/L (ref 38–126)
ALK PHOS: 123 U/L (ref 38–126)
ALT: 2953 U/L — ABNORMAL HIGH (ref 17–63)
ALT: 8573 U/L — AB (ref 17–63)
ANION GAP: 22 — AB (ref 5–15)
AST: 2592 U/L — ABNORMAL HIGH (ref 15–41)
Albumin: 2 g/dL — ABNORMAL LOW (ref 3.5–5.0)
Anion gap: 28 — ABNORMAL HIGH (ref 5–15)
BILIRUBIN TOTAL: 1.1 mg/dL (ref 0.3–1.2)
BILIRUBIN TOTAL: 2.7 mg/dL — AB (ref 0.3–1.2)
BUN: 29 mg/dL — ABNORMAL HIGH (ref 6–20)
BUN: 35 mg/dL — ABNORMAL HIGH (ref 6–20)
CALCIUM: 8.9 mg/dL (ref 8.9–10.3)
CALCIUM: 7.7 mg/dL — AB (ref 8.9–10.3)
CO2: 21 mmol/L — ABNORMAL LOW (ref 22–32)
CO2: 11 mmol/L — AB (ref 22–32)
Chloride: 91 mmol/L — ABNORMAL LOW (ref 101–111)
Chloride: 93 mmol/L — ABNORMAL LOW (ref 101–111)
Creatinine, Ser: 2.81 mg/dL — ABNORMAL HIGH (ref 0.61–1.24)
Creatinine, Ser: 3.88 mg/dL — ABNORMAL HIGH (ref 0.61–1.24)
GFR calc Af Amer: 19 mL/min — ABNORMAL LOW (ref >60)
GFR calc non Af Amer: 24 mL/min — ABNORMAL LOW (ref >60)
GFR calc non Af Amer: 16 mL/min — ABNORMAL LOW (ref >60)
GFR, EST AFRICAN AMERICAN: 27 mL/min — AB (ref >60)
GLUCOSE: 90 mg/dL (ref 65–99)
GLUCOSE: 80 mg/dL (ref 65–99)
Potassium: 7.5 mmol/L (ref 3.5–5.1)
Potassium: 7.1 mmol/L (ref 3.5–5.1)
Sodium: 134 mmol/L — ABNORMAL LOW (ref 135–145)
Sodium: 132 mmol/L — ABNORMAL LOW (ref 135–145)
TOTAL PROTEIN: 4.7 g/dL — AB (ref 6.5–8.1)
TOTAL PROTEIN: 4.5 g/dL — AB (ref 6.5–8.1)

## 2015-04-28 LAB — CBC
HCT: 28.9 % — ABNORMAL LOW (ref 39.0–52.0)
HEMATOCRIT: 25.4 % — AB (ref 39.0–52.0)
Hemoglobin: 8.2 g/dL — ABNORMAL LOW (ref 13.0–17.0)
Hemoglobin: 8.8 g/dL — ABNORMAL LOW (ref 13.0–17.0)
MCH: 28.9 pg (ref 26.0–34.0)
MCH: 28.5 pg (ref 26.0–34.0)
MCHC: 32.3 g/dL (ref 30.0–36.0)
MCHC: 30.4 g/dL (ref 30.0–36.0)
MCV: 89.4 fL (ref 78.0–100.0)
MCV: 93.5 fL (ref 78.0–100.0)
Platelets: 93 10*3/uL — ABNORMAL LOW (ref 150–400)
Platelets: 61 10*3/uL — ABNORMAL LOW (ref 150–400)
RBC: 2.84 MIL/uL — AB (ref 4.22–5.81)
RBC: 3.09 MIL/uL — ABNORMAL LOW (ref 4.22–5.81)
RDW: 13.4 % (ref 11.5–15.5)
RDW: 15.6 % — ABNORMAL HIGH (ref 11.5–15.5)
WBC: 16.5 10*3/uL — AB (ref 4.0–10.5)
WBC: 20.2 10*3/uL — ABNORMAL HIGH (ref 4.0–10.5)

## 2015-04-28 LAB — POCT I-STAT 4, (NA,K, GLUC, HGB,HCT)
GLUCOSE: 73 mg/dL (ref 65–99)
GLUCOSE: 149 mg/dL — AB (ref 65–99)
HCT: 27 % — ABNORMAL LOW (ref 39.0–52.0)
HEMATOCRIT: 26 % — AB (ref 39.0–52.0)
HEMOGLOBIN: 8.8 g/dL — AB (ref 13.0–17.0)
Hemoglobin: 9.2 g/dL — ABNORMAL LOW (ref 13.0–17.0)
POTASSIUM: 7.1 mmol/L — AB (ref 3.5–5.1)
POTASSIUM: 5.3 mmol/L — AB (ref 3.5–5.1)
SODIUM: 130 mmol/L — AB (ref 135–145)
Sodium: 135 mmol/L (ref 135–145)

## 2015-04-28 LAB — BASIC METABOLIC PANEL
Anion gap: 23 — ABNORMAL HIGH (ref 5–15)
Anion gap: 42 — ABNORMAL HIGH (ref 5–15)
BUN: 33 mg/dL — ABNORMAL HIGH (ref 6–20)
BUN: 28 mg/dL — AB (ref 6–20)
CALCIUM: 8 mg/dL — AB (ref 8.9–10.3)
CHLORIDE: 96 mmol/L — AB (ref 101–111)
CHLORIDE: 92 mmol/L — AB (ref 101–111)
CO2: 18 mmol/L — AB (ref 22–32)
CO2: 10 mmol/L — ABNORMAL LOW (ref 22–32)
CREATININE: 3.64 mg/dL — AB (ref 0.61–1.24)
Calcium: 8.4 mg/dL — ABNORMAL LOW (ref 8.9–10.3)
Creatinine, Ser: 3.22 mg/dL — ABNORMAL HIGH (ref 0.61–1.24)
GFR calc Af Amer: 20 mL/min — ABNORMAL LOW (ref >60)
GFR calc non Af Amer: 20 mL/min — ABNORMAL LOW (ref >60)
GFR calc non Af Amer: 17 mL/min — ABNORMAL LOW (ref >60)
GFR, EST AFRICAN AMERICAN: 23 mL/min — AB (ref >60)
Glucose, Bld: 121 mg/dL — ABNORMAL HIGH (ref 65–99)
Glucose, Bld: 178 mg/dL — ABNORMAL HIGH (ref 65–99)
POTASSIUM: 6.4 mmol/L — AB (ref 3.5–5.1)
Potassium: 5.7 mmol/L — ABNORMAL HIGH (ref 3.5–5.1)
SODIUM: 137 mmol/L (ref 135–145)
SODIUM: 144 mmol/L (ref 135–145)

## 2015-04-28 LAB — POCT I-STAT, CHEM 8
BUN: 41 mg/dL — ABNORMAL HIGH (ref 6–20)
CALCIUM ION: 1.01 mmol/L — AB (ref 1.12–1.23)
CHLORIDE: 98 mmol/L — AB (ref 101–111)
Creatinine, Ser: 3 mg/dL — ABNORMAL HIGH (ref 0.61–1.24)
Glucose, Bld: 117 mg/dL — ABNORMAL HIGH (ref 65–99)
HEMATOCRIT: 22 % — AB (ref 39.0–52.0)
Hemoglobin: 7.5 g/dL — ABNORMAL LOW (ref 13.0–17.0)
POTASSIUM: 6 mmol/L — AB (ref 3.5–5.1)
Sodium: 133 mmol/L — ABNORMAL LOW (ref 135–145)
TCO2: 20 mmol/L (ref 0–100)

## 2015-04-28 LAB — GLUCOSE, CAPILLARY
GLUCOSE-CAPILLARY: 76 mg/dL (ref 65–99)
GLUCOSE-CAPILLARY: 114 mg/dL — AB (ref 65–99)

## 2015-04-28 LAB — LACTIC ACID, PLASMA
LACTIC ACID, VENOUS: 8.8 mmol/L — AB (ref 0.5–2.0)
LACTIC ACID, VENOUS: 19.2 mmol/L — AB (ref 0.5–2.0)
Lactic Acid, Venous: 18.7 mmol/L (ref 0.5–2.0)

## 2015-04-28 LAB — PROTIME-INR: Prothrombin Time: >90 seconds — ABNORMAL HIGH (ref 11.6–15.2)

## 2015-04-28 LAB — DIGOXIN LEVEL: Digoxin Level: 1.5 ng/mL (ref 0.8–2.0)

## 2015-04-28 LAB — APTT: aPTT: 64 seconds — ABNORMAL HIGH (ref 24–37)

## 2015-04-28 LAB — FIBRINOGEN: FIBRINOGEN: 302 mg/dL (ref 204–475)

## 2015-04-28 MED ORDER — DEXTROSE 50 % IV SOLN
1.0000 | Freq: Once | INTRAVENOUS | Status: AC
  Administered 2015-04-28: 50 mL via INTRAVENOUS
  Filled 2015-04-28: qty 50

## 2015-04-28 MED ORDER — SODIUM CHLORIDE 0.9 % IV SOLN
Freq: Once | INTRAVENOUS | Status: AC
  Administered 2015-04-28: 05:00:00 via INTRAVENOUS

## 2015-04-28 MED ORDER — SODIUM BICARBONATE 8.4 % IV SOLN
100.0000 meq | Freq: Once | INTRAVENOUS | Status: AC
  Administered 2015-04-28: 100 meq via INTRAVENOUS

## 2015-04-28 MED ORDER — SODIUM CHLORIDE 0.9 % IV SOLN
Freq: Once | INTRAVENOUS | Status: AC
  Administered 2015-04-28: 11:00:00 via INTRAVENOUS

## 2015-04-28 MED ORDER — PRISMASOL BGK 0/2.5 32-2.5 MEQ/L IV SOLN
INTRAVENOUS | Status: DC
  Administered 2015-04-28: 12:00:00 via INTRAVENOUS_CENTRAL
  Filled 2015-04-28: qty 5000

## 2015-04-28 MED ORDER — VITAMIN K1 10 MG/ML IJ SOLN
2.5000 mg | Freq: Once | INTRAVENOUS | Status: AC
  Administered 2015-04-28: 2.5 mg via INTRAVENOUS
  Filled 2015-04-28: qty 0.25

## 2015-04-28 MED ORDER — EPINEPHRINE HCL 1 MG/ML IJ SOLN
0.5000 ug/min | INTRAVENOUS | Status: DC
  Administered 2015-04-28: 8 ug/min via INTRAVENOUS
  Filled 2015-04-28 (×2): qty 4

## 2015-04-28 MED ORDER — SODIUM CHLORIDE 0.9 % IV SOLN
1.0000 g | Freq: Once | INTRAVENOUS | Status: AC
  Administered 2015-04-28: 1 g via INTRAVENOUS
  Filled 2015-04-28: qty 10

## 2015-04-28 MED ORDER — HEPARIN SODIUM (PORCINE) 1000 UNIT/ML DIALYSIS
1000.0000 [IU] | INTRAMUSCULAR | Status: DC | PRN
  Filled 2015-04-28: qty 6

## 2015-04-28 MED ORDER — SODIUM CHLORIDE 0.9 % IV SOLN
INTRAVENOUS | Status: DC
  Administered 2015-04-28: 16:00:00 via INTRAVENOUS

## 2015-04-28 MED ORDER — AMIODARONE HCL IN DEXTROSE 360-4.14 MG/200ML-% IV SOLN
60.0000 mg/h | INTRAVENOUS | Status: DC
  Administered 2015-04-28: 60 mg/h via INTRAVENOUS

## 2015-04-28 MED ORDER — SODIUM BICARBONATE 8.4 % IV SOLN
INTRAVENOUS | Status: AC
  Filled 2015-04-28: qty 100

## 2015-04-28 MED ORDER — EPINEPHRINE HCL 0.1 MG/ML IJ SOSY
1.0000 mg | PREFILLED_SYRINGE | Freq: Once | INTRAMUSCULAR | Status: AC
  Administered 2015-04-28: 1 mg via INTRAVENOUS

## 2015-04-28 MED ORDER — INSULIN ASPART 100 UNIT/ML ~~LOC~~ SOLN
10.0000 [IU] | Freq: Once | SUBCUTANEOUS | Status: AC
  Administered 2015-04-28: 10 [IU] via INTRAVENOUS

## 2015-04-28 MED ORDER — ALBUMIN HUMAN 5 % IV SOLN
12.5000 g | INTRAVENOUS | Status: DC | PRN
  Filled 2015-04-28 (×2): qty 250

## 2015-04-28 MED ORDER — SODIUM BICARBONATE 8.4 % IV SOLN
INTRAVENOUS | Status: AC
  Administered 2015-04-28: 50 meq via INTRAVENOUS
  Filled 2015-04-28: qty 50

## 2015-04-28 MED ORDER — SODIUM BICARBONATE 8.4 % IV SOLN
100.0000 meq | Freq: Once | INTRAVENOUS | Status: AC
Start: 2015-04-28 — End: 2015-04-28
  Administered 2015-04-28: 100 meq via INTRAVENOUS

## 2015-04-28 MED ORDER — AMIODARONE LOAD VIA INFUSION
150.0000 mg | Freq: Once | INTRAVENOUS | Status: AC
  Administered 2015-04-28: 150 mg via INTRAVENOUS

## 2015-04-28 MED ORDER — HEPARIN (PORCINE) 2000 UNITS/L FOR CRRT
INTRAVENOUS_CENTRAL | Status: DC | PRN
  Administered 2015-04-28: 12:00:00 via INTRAVENOUS_CENTRAL
  Filled 2015-04-28 (×3): qty 1000

## 2015-04-28 MED ORDER — PRISMASOL BGK 0/2.5 32-2.5 MEQ/L IV SOLN
INTRAVENOUS | Status: DC
  Administered 2015-04-28: 12:00:00 via INTRAVENOUS_CENTRAL
  Filled 2015-04-28 (×2): qty 5000

## 2015-04-28 MED ORDER — ALBUMIN HUMAN 5 % IV SOLN
25.0000 g | Freq: Once | INTRAVENOUS | Status: AC
  Administered 2015-04-28: 12.5 g via INTRAVENOUS

## 2015-04-28 MED ORDER — SODIUM CHLORIDE 0.9 % IV BOLUS (SEPSIS)
500.0000 mL | Freq: Once | INTRAVENOUS | Status: AC
  Administered 2015-04-28: 500 mL via INTRAVENOUS

## 2015-04-28 MED ORDER — ALBUMIN HUMAN 5 % IV SOLN
INTRAVENOUS | Status: AC
  Administered 2015-04-28: 12.5 g via INTRAVENOUS
  Filled 2015-04-28: qty 500

## 2015-04-28 MED ORDER — SODIUM POLYSTYRENE SULFONATE 15 GM/60ML PO SUSP
30.0000 g | Freq: Once | ORAL | Status: AC
  Administered 2015-04-28: 30 g
  Filled 2015-04-28: qty 120

## 2015-04-28 MED ORDER — DEXTROSE 50 % IV SOLN
INTRAVENOUS | Status: AC
  Filled 2015-04-28: qty 50

## 2015-04-28 MED ORDER — PRISMASOL BGK 0/2.5 32-2.5 MEQ/L IV SOLN
INTRAVENOUS | Status: DC
  Administered 2015-04-28: 12:00:00 via INTRAVENOUS_CENTRAL
  Filled 2015-04-28 (×7): qty 5000

## 2015-04-28 MED ORDER — ALBUMIN HUMAN 5 % IV SOLN
25.0000 g | Freq: Once | INTRAVENOUS | Status: AC
  Administered 2015-04-28: 25 g via INTRAVENOUS

## 2015-04-28 MED ORDER — SODIUM BICARBONATE 8.4 % IV SOLN
INTRAVENOUS | Status: DC
  Administered 2015-04-28: 10:00:00 via INTRAVENOUS
  Filled 2015-04-28 (×3): qty 150

## 2015-04-28 MED ORDER — SODIUM CHLORIDE 0.9 % IV SOLN
INTRAVENOUS | Status: DC
Start: 2015-04-28 — End: 2015-04-28

## 2015-04-28 MED ORDER — NOREPINEPHRINE BITARTRATE 1 MG/ML IV SOLN
2.0000 ug/min | INTRAVENOUS | Status: DC
  Administered 2015-04-28: 45 ug/min via INTRAVENOUS
  Administered 2015-04-28: 30 ug/min via INTRAVENOUS
  Filled 2015-04-28 (×3): qty 16

## 2015-04-28 MED ORDER — AMIODARONE HCL IN DEXTROSE 360-4.14 MG/200ML-% IV SOLN
INTRAVENOUS | Status: AC
Start: 2015-04-28 — End: 2015-04-28
  Filled 2015-04-28: qty 200

## 2015-04-28 MED ORDER — DEXTROSE 50 % IV SOLN
INTRAVENOUS | Status: AC
  Administered 2015-04-28: 50 mL
  Filled 2015-04-28: qty 50

## 2015-04-28 MED ORDER — CALCIUM CHLORIDE 10 % IV SOLN
1.0000 g | Freq: Once | INTRAVENOUS | Status: AC
  Administered 2015-04-28: 1 g via INTRAVENOUS
  Filled 2015-04-28: qty 10

## 2015-04-28 MED ORDER — CEFAZOLIN SODIUM-DEXTROSE 2-3 GM-% IV SOLR
2.0000 g | Freq: Two times a day (BID) | INTRAVENOUS | Status: DC
  Filled 2015-04-28: qty 50

## 2015-04-28 MED ORDER — AMIODARONE HCL IN DEXTROSE 360-4.14 MG/200ML-% IV SOLN: 30.0000 mg/h | INTRAVENOUS | Status: DC

## 2015-04-28 MED ORDER — SODIUM BICARBONATE 8.4 % IV SOLN
50.0000 meq | Freq: Once | INTRAVENOUS | Status: AC
  Administered 2015-04-28: 50 meq via INTRAVENOUS

## 2015-04-28 MED FILL — Medication: Qty: 1 | Status: AC

## 2015-04-29 LAB — PREPARE FRESH FROZEN PLASMA
UNIT DIVISION: 0
Unit division: 0

## 2015-05-01 LAB — TYPE AND SCREEN
ABO/RH(D): AB POS
ANTIBODY SCREEN: NEGATIVE
UNIT DIVISION: 0
Unit division: 0
Unit division: 0
Unit division: 0

## 2015-05-01 MED FILL — Perflutren Lipid Microsphere IV Susp 1.1 MG/ML: INTRAVENOUS | Qty: 10 | Status: AC

## 2015-05-02 NOTE — Progress Notes (Signed)
Emergent TEE performed bedside, it showed well functioning bioprosthetic aortic valve with no AI or paravalvular leak, normal size of the aortic root and ascending aorta, there is no obvious dissection, normal color Doppler, possible artifact seen. LVEF estimated at 35-40% with inferior and inferolateral hypokinesis. Overall LV has severe concentric hypertrophy and is under filled. RV has normal size and has moderately decreased RVEF.  There is moderate circumferential organized pericardial effusion with fibrinous strands.  While doing the TEE, the patient went into a-fib with RVR with worsening hypotension despite levophed 45 mcg/kg and epinephrine 15 mcg/kg. He was emergently cardioverted into SR with frequent ectopy and startd on amiodarone. He was started on iv albumin.  CVP 19.  Dr Nicholes Mangodan Bensimhon was present during the decision making and during the cardioversion. The patient is not a candidate for Impella or IABP.  Donald [name], Donald [name] July 01, 2015

## 2015-05-02 NOTE — CV Procedure (Signed)
     DIRECT CURRENT CARDIOVERSION  NAME:  Donald [name]   MRN: [id number] DOB:  January 14, 1961   ADMIT DATE: 04/24/2015   INDICATIONS: Atrial fibrillation and shock  Operators: Drs. Roslyn Else and Nelson.    PROCEDURE:   The patient was already intubated and sedated. Emergent consent implied. Defibrillator pads placed in the anterior and posterior position. The \ patient received a single biphasic, synchronized 200J shock with prompt conversion to sinus rhythm. No apparent complications.  [name],MD 5:27 PM

## 2015-05-02 NOTE — Progress Notes (Signed)
ANTIBIOTIC CONSULT NOTE  Pharmacy Consult for cefazolin Indication: surgical wound prophylaxis  No Known Allergies  Patient Measurements: Height: 5\' 11"  (180.3 cm) Weight: 212 lb 8.4 oz (96.4 kg) IBW/kg (Calculated) : 75.3  Vital Signs: Temp: 96.9 F (36.1 C) (10/28 1015) Temp Source: Rectal (10/28 1015) BP: 83/47 mmHg (10/28 1000) Pulse Rate: 87 (10/28 1030) Intake/Output from previous day: 10/27 0701 - 10/28 0700 In: 3327.5 [P.O.:840; I.V.:757.5; Blood:30; IV Piggyback:1700] Out: 2505 [Urine:2255; Emesis/NG output:250] Intake/Output from this shift: Total I/O In: 1025.9 [I.V.:310.9; Blood:685; NG/GT:30] Out: 0   Labs:  Recent Labs  04/26/15 0250  04/27/15 2235 Nov 24, 2014 0325 Nov 24, 2014 0330 Nov 24, 2014 0822  WBC 9.2  --  20.3*  --  16.5*  --   HGB 11.7*  --  9.8* 7.5* 8.2*  --   PLT 199  --  105*  --  93*  --   CREATININE  --   < > 2.81* 3.00* 3.22* 3.88*  < > = values in this interval not displayed. Estimated Creatinine Clearance: 25.2 mL/min (by C-G formula based on Cr of 3.88). No results for input(s): VANCOTROUGH, VANCOPEAK, VANCORANDOM, GENTTROUGH, GENTPEAK, GENTRANDOM, TOBRATROUGH, TOBRAPEAK, TOBRARND, AMIKACINPEAK, AMIKACINTROU, AMIKACIN in the last 72 hours.   Microbiology: Recent Results (from the past 720 hour(s))  Surgical pcr screen     Status: Abnormal   Collection Time: 04/19/15 11:58 AM  Result Value Ref Range Status   MRSA, PCR POSITIVE (A) NEGATIVE Final   Staphylococcus aureus POSITIVE (A) NEGATIVE Final    Comment:        The Xpert SA Assay (FDA approved for NASAL specimens in patients over 56 years of age), is one component of a comprehensive surveillance program.  Test performance has been validated by [facility] for patients greater than or equal to 56 year old. It is not intended to diagnose infection nor to guide or monitor treatment.     Medical History: Past Medical History  Diagnosis Date  . Substance abuse   . A-fib (HCC)    . Hypertension   . Arthritis   . COPD (chronic obstructive pulmonary disease) (HCC)   . Dysrhythmia     Afib  . Shortness of breath dyspnea     with exertion  . GERD (gastroesophageal reflux disease)   . Constipation   . PE (pulmonary embolism)     11/24/14 right PE (HPR)  . Stroke (HCC) 02/27/15     TIA 02/27/15; no residual  . Sleep apnea   . Severe aortic stenosis   . Pneumonia     Medications:  Prescriptions prior to admission  Medication Sig Dispense Refill Last Dose  . albuterol (PROVENTIL) (2.5 MG/3ML) 0.083% nebulizer solution Take 3 mLs (2.5 mg total) by nebulization every 6 (six) hours as needed for wheezing or shortness of breath. 75 mL 3 04/14/2015 at 0445  . amiodarone (PACERONE) 200 MG tablet Take 1 tablet (200 mg total) by mouth daily. For Atrial fibrillation/Hypertrophic Cardiomyopathy 30 tablet 0 04/11/2015 at 0620  . atorvastatin (LIPITOR) 40 MG tablet Take 1 tablet (40 mg total) by mouth daily. 30 tablet 3 Past Week at Unknown time  . budesonide-formoterol (SYMBICORT) 160-4.5 MCG/ACT inhaler Inhale 2 puffs into the lungs 2 (two) times daily.   04/27/2015 at 0445  . COMBIVENT RESPIMAT 20-100 MCG/ACT AERS respimat Take 1 puff by mouth every 6 (six) hours as needed for wheezing or shortness of breath.    04/03/2015 at 0445  . guaiFENesin (MUCINEX) 600 MG 12 hr tablet Take  600 mg by mouth 2 (two) times daily as needed for to loosen phlegm.   Past Week at Unknown time  . metoprolol (LOPRESSOR) 50 MG tablet Take 50 mg by mouth 2 (two) times daily.   15-May-2015 at 0620  . XARELTO 20 MG TABS tablet Take 1 tablet (20 mg total) by mouth daily with supper. 30 tablet 0 Past Month at Unknown time   Assessment: 54 yo amel s/p AVR on 10/21, s/p cardiac arrest 10/27, continuing on Ancef D#2 for surgical wound prophylaxis s/p CPR. Now with multiple organ failure on 10/28. SCr trend up to 0.9>>3.88, minimal UOP, and hypotensive - CRRT to start 10/28. Cefazolin adjusted for CRRT dosing.  Low temps, wbc up to 16.5 (steroids). No cultures  10/27 cefazolin>>  Goal of Therapy:  Resolution of infection  Plan:  Cefazolin to 2g IV q12h for CRRT dosing Mon clinical progress, renal function, abx LOT  Babs Bertin, PharmD Clinical Pharmacist Pager [phone] 04/05/2015 11:24 AM

## 2015-05-02 NOTE — Accreditation Note (Signed)
o Restraints not reported to CMS Pursuant to regulation 482.13 (G) (3) use of soft wrist restraints was logged on 10.31.2016 at 0700 by Rosine DoorAngus Avelynn Sellin, RN, Patient Safety and Accreditation.

## 2015-05-02 NOTE — Procedures (Signed)
Arterial Catheter Insertion Procedure Note Donald ReichertLarry E [name] [id number] 08-13-1960  Procedure: Insertion of Arterial Catheter  Indications: Blood pressure monitoring and Frequent blood sampling  Procedure Details Consent: Unable to obtain consent because of emergent medical necessity. Time Out: Verified patient identification, verified procedure, site/side was marked, verified correct patient position, special equipment/implants available, medications/allergies/relevent history reviewed, required imaging and test results available.  Performed  Maximum sterile technique was used including antiseptics, cap, gloves, hand hygiene and mask. Skin prep: Chlorhexidine; local anesthetic administered 20 gauge catheter was inserted into right femoral artery using the Seldinger technique.  Evaluation Blood flow good; BP tracing good. Complications: No apparent complications.  Donald RoachPaul [name] Donald [name], AGACNP-BC [facility] Pager [phone] or [phone]  04/16/2015 1:03 AM

## 2015-05-02 NOTE — Progress Notes (Signed)
CRITICAL VALUE ALERT  Critical value received:  K 6.4  Date of notification:  04/27/2015  Time of notification:0400  Critical value read back:Yes.    Nurse who received alert:  Roselie AwkwardShannon Love, RN  MD notified (1st page):  Bartle  Time of first page:  [phone]

## 2015-05-02 NOTE — Progress Notes (Signed)
   04/26/2015 [id number]  Clinical Encounter Type  Visited With Patient not available;Health care provider  Visit Type Initial  Stress Factors  Patient Stress Factors None identified   Chaplain attempted to follow-up from a [place] last night, but patient is sedated and no family is present at this time. Chaplain support available as needed.   Alda PonderAdam M Nyan Dufresne, Chaplain 04/10/2015 9:29 AM

## 2015-05-02 NOTE — Consult Note (Signed)
 Donald [name] is an 54 y.o. male referred by Dr Cyndia Bent   Chief Complaint: ARF HPI: 54yo M admitted 04/06/2015 for AVR for AS.  Post Op course uneventful until yesterday when had a cardiac arrest and has been hypotensive since.  He had normal renal fx thru 04/24/15 and NL UA 04/19/15.  Yest Scr was 2.8 and today 3.88 with high K of 7.1.  He is on 2 pressors with minimal UO.  Past Medical History  Diagnosis Date  . Substance abuse   . A-fib ([place])   . Hypertension   . Arthritis   . COPD (chronic obstructive pulmonary disease) ([place])   . Dysrhythmia     Afib  . Shortness of breath dyspnea     with exertion  . GERD (gastroesophageal reflux disease)   . Constipation   . PE (pulmonary embolism)     11/24/14 right PE (HPR)  . Stroke ([place]) 02/27/15     TIA 02/27/15; no residual  . Sleep apnea   . Severe aortic stenosis   . Pneumonia     Past Surgical History  Procedure Laterality Date  . Tonsillectomy    . Cardiac catheterization N/A 02/28/2015    Procedure: Right/Left Heart Cath and Coronary Angiography;  Surgeon: Belva Crome, MD;  Location: [facility];  Service: Cardiovascular;  Laterality: N/A;  . Finger surgery Left     Index finger , tried to replant, unsuccessful  . Hand surgery Left     fracture  . Multiple extractions with alveoloplasty N/A 03/09/2015    Procedure: Extraction of tooth #'s 2,5,8,9,10,16 with alveoloplasty and gross debridement of remaining dentition;  Surgeon: Lenn Cal, DDS;  Location: [facility];  Service: Oral Surgery;  Laterality: N/A;  . Aortic valve replacement N/A 05/01/2015    Procedure: AORTIC VALVE REPLACEMENT (AVR);  Surgeon: Gaye Pollack, MD;  Location: [facility];  Service: Open Heart Surgery;  Laterality: N/A;  . Tee without cardioversion N/A     Procedure: TRANSESOPHAGEAL ECHOCARDIOGRAM (TEE);  Surgeon: Gaye Pollack, MD;  Location: [facility];  Service: Open Heart Surgery;  Laterality: N/A;    Family History  Problem Relation Age of  Onset  . Heart disease Father    Social History:  reports that he has been smoking Cigarettes.  He has a 44 pack-year smoking history. He has never used smokeless tobacco. He reports that he uses illicit drugs (Cocaine and "Crack" cocaine) about 7 times per week. He reports that he does not drink alcohol.  Allergies: No Known Allergies  Medications Prior to Admission  Medication Sig Dispense Refill  . albuterol (PROVENTIL) (2.5 MG/3ML) 0.083% nebulizer solution Take 3 mLs (2.5 mg total) by nebulization every 6 (six) hours as needed for wheezing or shortness of breath. 75 mL 3  . amiodarone (PACERONE) 200 MG tablet Take 1 tablet (200 mg total) by mouth daily. For Atrial fibrillation/Hypertrophic Cardiomyopathy 30 tablet 0  . atorvastatin (LIPITOR) 40 MG tablet Take 1 tablet (40 mg total) by mouth daily. 30 tablet 3  . budesonide-formoterol (SYMBICORT) 160-4.5 MCG/ACT inhaler Inhale 2 puffs into the lungs 2 (two) times daily.    . COMBIVENT RESPIMAT 20-100 MCG/ACT AERS respimat Take 1 puff by mouth every 6 (six) hours as needed for wheezing or shortness of breath.     . guaiFENesin (MUCINEX) 600 MG 12 hr tablet Take 600 mg by mouth 2 (two) times daily as needed for to loosen phlegm.    . metoprolol (LOPRESSOR) 50  MG tablet Take 50 mg by mouth 2 (two) times daily.    Alveda Reasons 20 MG TABS tablet Take 1 tablet (20 mg total) by mouth daily with supper. 30 tablet 0     Lab Results: UA: As above   Recent Labs  04/26/15 0250 04/27/15 2235 05-28-15 0325 28-May-2015 0330  WBC 9.2 20.3*  --  16.5*  HGB 11.7* 9.8* 7.5* 8.2*  HCT 35.6* 29.4* 22.0* 25.4*  PLT 199 105*  --  93*   BMET  Recent Labs  04/27/15 2235 2015-05-28 0325 05/28/2015 0330 05/28/2015 0822  NA 134* 133* 137 132*  K 7.5* 6.0* 6.4* 7.1*  CL 91* 98* 96* 93*  CO2 21*  --  18* 11*  GLUCOSE 90 117* 121* 80  BUN 29* 41* 33* 35*  CREATININE 2.81* 3.00* 3.22* 3.88*  CALCIUM 8.9  --  8.4* 7.7*   LFT  Recent Labs  05/28/2015 0822   PROT 4.5*  ALBUMIN 2.4*  AST >10000*  ALT 8573*  ALKPHOS 123  BILITOT 2.7*   Dg Chest Port 1 View  2015/05/28  CLINICAL DATA:  Status post intubation. EXAM: PORTABLE CHEST 1 VIEW COMPARISON:  Single view of the chest 04/27/2015. FINDINGS: Endotracheal tube is in place with tip in good position just below the clavicular heads. Left IJ catheter and NG tube are unchanged. Lungs are clear. Heart size is mildly enlarged. No pneumothorax or pleural effusion. Defibrillator pads noted. IMPRESSION: Endotracheal tube projects in good position. Cardiomegaly without acute disease. Electronically Signed   By: Inge Rise M.D.   On: 05/28/2015 10:06   Dg Chest Port 1 View  04/27/2015  CLINICAL DATA:  54 year old male status post central line placement. EXAM: PORTABLE CHEST 1 VIEW COMPARISON:  Chest x-ray 04/27/2015. FINDINGS: There is a left-sided internal jugular central venous catheter with tip terminating in the proximal to mid superior vena cava. An endotracheal tube is in place with tip 5.6 cm above the carina. Transcutaneous defibrillator pads project over the left hemithorax. Status post median sternotomy for aortic valve replacement (a stented bio prosthesis is noted). Right hemithorax is incompletely imaged. Linear opacity at the base of the right hemithorax, compatible with subsegmental atelectasis or scarring, similar to the prior examination. Visualized portions of the lungs are otherwise clear. No pleural effusions. No evidence of pulmonary edema. Heart size is upper limits of normal. Upper mediastinal contours are within normal limits. No pneumothorax. Multiple old healed left-sided rib fractures. IMPRESSION: 1. Support apparatus, as above. 2. No pneumothorax following central line placement. 3. Subsegmental atelectasis or scarring in the right lung base. 4. Postoperative changes, as above. Electronically Signed   By: Vinnie Langton M.D.   On: 04/27/2015 23:49   Dg Chest Port 1  View  04/27/2015  CLINICAL DATA:  Code blue. Cardiac arrest. Generalized abdominal distention. Initial encounter. EXAM: PORTABLE CHEST 1 VIEW COMPARISON:  Chest radiograph performed earlier today at 12:48 p.m. FINDINGS: The patient's endotracheal tube is seen ending 6-7 cm above the carina. Minimal right basilar atelectasis is noted. No pleural effusion or pneumothorax is seen. The cardiomediastinal silhouette is borderline enlarged. The patient is status post median sternotomy. No acute osseous abnormalities are identified. 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 External pacing pads are noted IMPRESSION: 1. Endotracheal tube seen ending 6-7 cm above the carina. 2. Minimal right basilar atelectasis noted.  Lungs otherwise clear. 3. Borderline cardiomegaly. Electronically Signed   By: Garald Balding M.D.   On: 04/27/2015 22:48   Dg Abd  Acute W/chest  04/27/2015  CLINICAL DATA:  Mid abdominal pain with shortness breath, nausea and vomiting today. Recent aortic valve replacement. Initial encounter. EXAM: DG ABDOMEN ACUTE W/ 1V CHEST COMPARISON:  Chest radiographs 04/24/2015 chest CT 02/02/2015 and abdominal CT 03/06/2014. FINDINGS: The heart size and mediastinal contours are stable status post median sternotomy. Bibasilar atelectasis is similar to the most recent radiographic examination with resulting obscuration of the nodular density previously demonstrated along the right hemidiaphragm. There may be a small amount of pleural fluid on the right. The lungs are otherwise clear. Old rib fractures are present on the left. The bowel gas pattern is nonobstructive. There is moderate stool in the right colon and the stomach is fluid-filled. No suspicious abdominal calcifications. The bones appear unremarkable. IMPRESSION: Stable bibasilar atelectasis and probable mild abdominal ileus. No evidence of bowel obstruction or perforation. Electronically Signed   By: Richardean Sale M.D.   On: 04/27/2015 13:17     ROS: Pt intubated and sedated  PHYSICAL EXAM: Blood pressure 83/47, pulse 87, temperature 96.9 F (36.1 C), temperature source Rectal, resp. rate 28, height _0  (1.803 m), weight 96.4 kg (212 lb 8.4 oz), SpO2 100 %. HEENT: Pt intubated NECK:Lt IJ triple lumen LUNGS:clear ant CARDIAC:RRR OVZ:CHYI BS, + distention EXT:No edema.  Art line Rt fem art NEURO:Sedated  Assessment: 1. ARF sec ATN from hypotension due to cardiac arrest 2. SP AVR 3. Hyperkalemia 4. Met Acidosis 5. Shock liver PLAN: 1. Will emergently begin CVVHD to correct hyperkalemia and acidosis 2. Keep volume even for now 3. Send serum drug screen (no UO for UDS) 4. Cont bicarb gtt for now 5. Adjust AB for renal failure and CVVHD per pharmacy   Lynleigh Kovack T 05-16-2015, 10:40 AM

## 2015-05-02 NOTE — Procedures (Signed)
Hemodialysis Insertion Procedure Note Carlyn ReichertLarry E Dettman [id number] 1960-12-02  Procedure: Insertion of Hemodialysis Catheter Type: 3 port  Indications: Hemodialysis   Procedure Details Consent: Unable to obtain consent because of emergent medical necessity. Time Out: Verified patient identification, verified procedure, site/side was marked, verified correct patient position, special equipment/implants available, medications/allergies/relevent history reviewed, required imaging and test results available.  Performed  Maximum sterile technique was used including antiseptics, cap, gloves, gown, hand hygiene, mask and sheet. Skin prep: Chlorhexidine; local anesthetic administered A antimicrobial bonded/coated triple lumen catheter was placed in the left femoral vein due to emergent situation using the Seldinger technique. Ultrasound guidance used.Yes.   Catheter placed to 20 cm. Blood aspirated via all 3 ports and then flushed x 3. Line sutured x 2 and dressing applied.  Evaluation Blood flow good Complications: No apparent complications Patient did tolerate procedure well. Chest X-ray ordered to verify placement.  CXR: not needed in femoral line.Brett Canales.  Steve Daniela Siebers ACNP [facility] Pager [phone] till 3 pm If no answer page [phone] 2015/05/29, 11:14 AM

## 2015-05-02 NOTE — Progress Notes (Signed)
CRITICAL VALUE ALERT  Critical value received:  Potassium=7.1  Date of notification:  04/17/2015  Time of notification:  0823  Critical value read back:Yes.    Nurse who received alert:  Deberah PeltonKari Idalis Hoelting, RN  MD notified (1st page):  Dr. Marchelle Gearingamaswamy at bedside seeing patient. Made aware. Insulin and D50 given per MD. Will continue to monitor.

## 2015-05-02 NOTE — Progress Notes (Signed)
CRITICAL VALUE ALERT  Critical value received:  Acute MI on 12-lead  Date of notification: 04/23/2015 Time of notification:  0605  Critical value read back:Yes.    Nurse who received alert:  Rita OharaLiz Kaya Klausing   MD notified (1st page):  Dr Laneta SimmersBartle  Time of first page:  0610   No new orders received at this time.

## 2015-05-02 NOTE — Progress Notes (Signed)
   Case reviewed with Dr. Delton SeeNelson. TEE images reviewed.   Patient s/p AVR 1 week ago. Experienced asystolic arrest yesterday. Now with MSOF and profound acidosis despite bicarb drip an CVVHD. SBP 60-70 despite levophed 45 and epi at 15.   TEE with Mild LV dysfunction severe LVH. RV hypokinetic. Small to moderate pericardial effusion. AVR stable. No dissection.   While I was at the bedside he went into AF and became more hypotensive. We gave two boluses bicarb and preformed emergent DC-CV with pads in the A/P position with conversion into NSR. Amio started.  On exam he is hypothermic to 35 despite [employer]  He is intubated and nonresponsive CVP 20 Cor IRR tachy Lungs rhochorous Ab distended quiet Ext cool. 1+ edema  A/P: He has profound cardiogenic shock with MSOF. He remains markedly hypotensive and acidotic despite max pressors and CVVHD. Unfortunately there is no chance for survival. Dr. Laneta SimmersBartle has made him DNR and I concur. I will be available to help should anything change.   The patient is critically ill with multiple organ systems failure and requires high complexity decision making for assessment and support, frequent evaluation and titration of therapies, application of advanced monitoring technologies and extensive interpretation of multiple databases.   Critical Care Time devoted to patient care services described in this note is 60 Minutes. Not including DC-CV.   Bensimhon, Daniel,MD 5:25 PM

## 2015-05-02 NOTE — Progress Notes (Signed)
Patient's time of death [id number]. Verified by Deberah PeltonKari Deitra Craine, RN and Fatima SangerFred Smith, RN. Dr. Laneta SimmersBartle at bedside. Dr Laneta SimmersBartle and RN spoke with next of kin, patient's brother, Alberteen SamDon Hildebran.

## 2015-05-02 NOTE — Progress Notes (Addendum)
RN in to assist pt out of bathroom and pt became very SOB and complaining of abdominal pain. Pt oxygen sat in 80's; RN placed pt on 2 N/C and administered PRN Neb treatment. Oxygen sat came up to the 90's but pt still having SOB; breathing very labored & restless.  Pt now having chest pain; 12 Lead EKG obtained per protocol. Rapid Response RN April at the bedside. 2nd Neb treatment given ordered by RRN. Pt oxygen saturation now in the 70's and non-rebreather applied.   RN paged Dr. Laneta SimmersBartle to update him on pt status. Dr. Laneta SimmersBartle aware and new order given to transfer pt. Pt in respiratory distress and transferred to 2S14 by bedside RN & RRN. Bedside report given to receiving nurse.

## 2015-05-02 NOTE — Progress Notes (Signed)
Patient ID: Donald ReichertLarry E [name], male   DOB: [birth year], 54 y.o.   MRN: [id number]  Patient has continued to worsen today and is on 45 mcg Levophed, 15 mcg epi with a SBP in the 60's. He is on CVVH and continues to have severe acidosis with lactic acid of 19.2, pH 7.1.  TEE reviewed with Dr. Delton SeeNelson and LV function looks good with fairly small cavity size and RV is moderately hypokinetic. There is a small apical pericardial effusion with no sign of tamponade. The aortic valve prosthesis looks ok and there is no aortic dissection.   We will continue present level of support and give some volume but with profound acidosis that we have not been able to correct with CVVH, bicarb drip I don't think there is any further intervention that is going to improve things.  I have made him a DNR because if he codes there is no chance of survival and no need to put him through further resuscitation. I called his brother but it went straight to voice mail. I left a message for him to call.

## 2015-05-02 NOTE — Progress Notes (Addendum)
Called by primary RN to see pt for acute SOB.  He had been in the BR and was walking back to bed when he developed SOB.  Pt is very anxious with mottled dusky appearance.  Sats 85% & placed on NRB. Spoke with Dr. Laneta SimmersBartle and updated him that 2 neb tx had been given & requested PCXR. Rcvd orders to tx to SICU.  Pt stats 93% on NRB but with improved slightly improved BS & slightly improved color.  Tx pt  To 2S14.

## 2015-05-02 NOTE — Progress Notes (Signed)
  Echocardiogram Echocardiogram Transesophageal has been performed.  Janalyn HarderWest, Mirissa Lopresti R 2014/10/13, 3:18 PM

## 2015-05-02 NOTE — Progress Notes (Signed)
Patient ID: Donald ReichertLarry E Jurczyk, male   DOB: 09-07-1960, 54 y.o.   MRN: [id number]  He remains critically ill with multi-system organ failure on levophed 45 and epi 12 with borderline BP in the 90 range. CVP is 19.  Echo reviewed and shows EF 50% with diastolic dysfunction, no AI and low gradient across aortic valve. Moderate RV dysfunction, there is some pericardial effusion mostly at apex but no sign of tamponade.  He has severe metabolic acidosis and is anuric getting ready to start CVVH. K+ 7.1.  Lactic acid is 18.7. Severe acidosis likely due to combination of shock, high dose pressors, anuric renal failure, liver failure and possible gut ischemia.  Adequate oxygenation and ventilation on current settings.  Shock liver   CMP Latest Ref Rng 31-Jan-2015 31-Jan-2015 31-Jan-2015  Glucose 65 - 99 mg/dL 80 409(W121(H) 119(J117(H)  BUN 6 - 20 mg/dL 47(W35(H) 29(F33(H) 62(Z41(H)  Creatinine 0.61 - 1.24 mg/dL 3.08(M3.88(H) 5.78(I3.22(H) 6.96(E3.00(H)  Sodium 135 - 145 mmol/L 132(L) 137 133(L)  Potassium 3.5 - 5.1 mmol/L 7.1(HH) 6.4(HH) 6.0(H)  Chloride 101 - 111 mmol/L 93(L) 96(L) 98(L)  CO2 22 - 32 mmol/L 11(L) 18(L) -  Calcium 8.9 - 10.3 mg/dL 7.7(L) 8.4(L) -  Total Protein 6.5 - 8.1 g/dL 9.5(M4.5(L) - -  Total Bilirubin 0.3 - 1.2 mg/dL 2.7(H) - -  Alkaline Phos 38 - 126 U/L 123 - -  AST 15 - 41 U/L >10000(H) - -  ALT 17 - 63 U/L 8573(H) - -   INR> 10. Has received two units of FFP. I gave him 2.5 mg vit K last night when INR was 5.    Will start CVVH to see if we can correct hyperkalemia and acidosis. If it does not improve then tissue ischemia or death is likely. I am still concerned about his abdomen but it is still fairly soft. CT not an option at this time. Surgical intervention not indicated at this time.

## 2015-05-02 NOTE — Progress Notes (Signed)
7 Days Post-Op Procedure(s) (LRB): AORTIC VALVE REPLACEMENT (AVR) (N/A) TRANSESOPHAGEAL ECHOCARDIOGRAM (TEE) (N/A) Subjective:  Intubated and sedated  Objective: Vital signs in last 24 hours: Temp:  [97.3 F (36.3 C)-99.2 F (37.3 C)] 97.9 F (36.6 C) (10/28 0630) Pulse Rate:  [49-120] 72 (10/28 0700) Cardiac Rhythm:  [-] Normal sinus rhythm (10/28 0600) Resp:  [0-31] 28 (10/28 0700) BP: (70-153)/(38-98) 87/52 mmHg (10/28 0700) SpO2:  [53 %-100 %] 79 % (10/28 0700) Arterial Line BP: (63-177)/(30-98) 93/53 mmHg (10/28 0630) FiO2 (%):  [50 %-100 %] 60 % (10/28 0600) Weight:  [96.4 kg (212 lb 8.4 oz)] 96.4 kg (212 lb 8.4 oz) (10/28 0344)  Hemodynamic parameters for last 24 hours: CVP:  [18 mmHg-23 mmHg] 21 mmHg  Intake/Output from previous day: 10/27 0701 - 10/28 0700 In: 3240.7 [P.O.:840; I.V.:670.7; Blood:30; IV Piggyback:1700] Out: 2505 [Urine:2255; Emesis/NG output:250] Intake/Output this shift: Total I/O In: 305 [Blood:305] Out: -   General appearance: intubated and sedated. not mottled Neurologic: unable to assess but did wake up post-arrest Heart: regular rate and rhythm, S1, S2 normal, no murmur, click, rub or gallop Lungs: clear to auscultation bilaterally Abdomen: no BS, distended but fairly soft Extremities: edema mild. Right femoral arterial line. Left femoral pulse palpable. Pedal pulses not palpable. Wound: chest incision dressed. No longer bleeding.  Lab Results:  Recent Labs  04/27/15 2235 06/12/2015 0325 06/12/2015 0330  WBC 20.3*  --  16.5*  HGB 9.8* 7.5* 8.2*  HCT 29.4* 22.0* 25.4*  PLT 105*  --  93*   BMET:  Recent Labs  04/27/15 2235 06/12/2015 0325 06/12/2015 0330  NA 134* 133* 137  K 7.5* 6.0* 6.4*  CL 91* 98* 96*  CO2 21*  --  18*  GLUCOSE 90 117* 121*  BUN 29* 41* 33*  CREATININE 2.81* 3.00* 3.22*  CALCIUM 8.9  --  8.4*    PT/INR:  Recent Labs  04/27/15 2235  LABPROT 47.6*  INR 5.34*   ABG    Component Value Date/Time    PHART 7.193* 08/20/14 0703   HCO3 12.4* 08/20/14 0703   TCO2 13 08/20/14 0703   ACIDBASEDEF 15.0* 08/20/14 0703   O2SAT 100.0 08/20/14 0703   CBG (last 3)   Recent Labs  04/26/15 2112  GLUCAP 143*    Assessment/Plan: S/P Procedure(s) (LRB): AORTIC VALVE REPLACEMENT (AVR) (N/A) TRANSESOPHAGEAL ECHOCARDIOGRAM (TEE) (N/A)  Acute decompensation yesterday afternoon and evening of unclear etiology. It started with some abdominal pain and nausea and progressed to acute respiratory failure and asystolic cardiac arrest. He is in shock with multi-system organ failure.  1.CV: He is on 45 mcg of Levophed to maintain barely adequate BP in the 90's and we have had to titrate that upward. He had fairly normal LV preop with no coronary disease. His Co-ox is 50% this am. He has inferior ST elevation on the ECG and troponin 0.5. Will get an echo this am to evaluate LV and assess for pericardial effusion and tamponade. Could not do CTA to rule out PE or aortic dissection due to acute renal failure. CVP in the low 20's could be consistent with tamponade.  2. Resp: Acute respiratory failure in this patient with severe COPD. I think this was precipitated by something else since his respiratory status had been stable and his lungs look clear on CXR.  3. Renal: acute anuric renal failure. He had normal creat a few days ago and was on no nephrotoxic agents. He made good urine yesterday until this started and  after foley inserted in ICU he has made no urine. He has had hyperkalemia that is being treated with kayexalate, glucose, insulin and bicarb. Will ask nephrology to see this am.   4. GI: Nausea, vomiting x 1 and abdominal pain yesterday afternoon with a lot of stool throughout colon suggests possible abdominal catastrophe but abdomen has remained relatively soft with low OG drainage. He does have marked metabolic acidosis that could be due to intestinal ischemia, renal failure, cardiogenic  shock.   I discussed his status with his brother by phone this am since he is his emergency contact.  He is out of town so I told him I would call him later today or if any major changes.   LOS: 7 days    Donald [name] 04/30/2015

## 2015-05-02 NOTE — Accreditation Note (Deleted)
o Restraints not reported to CMS Pursuant to regulation 482.13 (G) (3) use of soft wrist restraints was logged on (10.31.2016 at 0700) by Rosine Door(Zakariye Nee, RN, Accreditation and Patient Designer, industrial/productafety Coordinator).

## 2015-05-02 NOTE — Progress Notes (Signed)
CRITICAL VALUE ALERT  Critical value received:  INR>10  Date of notification:  2014/10/08  Time of notification:  0925  Critical value read back:Yes.    Nurse who received alert:  Colonel BaldKari Heavyn Yearsley,RN  MD notified (1st page):  Dr. Marchelle Gearingamaswamy at bedside seeing patient. Made aware. FFP ordered. Will continue to monitor.

## 2015-05-02 NOTE — Progress Notes (Signed)
EKG CRITICAL VALUE     12 lead EKG performed.  Critical value noted.Dr. Delton SeeNelson, notified.   Deitra MayoWATLINGTON, Ha Shannahan H, CCT 05/15/2015 12:56 PM

## 2015-05-02 NOTE — Progress Notes (Signed)
Initial Nutrition Assessment  DOCUMENTATION CODES:   Not applicable  INTERVENTION:  If pt remains intubated for >/= 48 hours, recommend starting nutrition.  Recommend Vital AF 1.2 @ 20 ml/hr via OGT and increase by 10 ml every 4 hours to goal rate of 70 ml/hr with 30 ml Prostat po once daily to provide 2116 kcal (100% of needs), 141 grams of protein, and 1361 ml of H2O.   RD to continue to monitor.   NUTRITION DIAGNOSIS:   Inadequate oral intake related to inability to eat as evidenced by NPO status.  GOAL:   Patient will meet greater than or equal to 90% of their needs  MONITOR:   Vent status, Skin, Weight trends, Labs, I & O's  REASON FOR ASSESSMENT:   Ventilator    ASSESSMENT:   54 y/o male with a history of PE, Afib, COPD underwent an aortic valve [facility]. His post operative course was uneventful but on 10/27 he developed abdominal cramping and constipation. He also had his pacer wires removed on 10/27. In the evening of 10/27 he developed worsening dyspnea. He was given bronchodilators with some relief. However his dyspnea rapidly progressed and he was transferred to the ICU by rapid response. In the ICU he was initially conversant but then developed PEA arrest. He had approximately 15 minutes of down time where he had asystole, PEA, Vfib, and Vtach. All were treated with standard ACLS resuscitation.  Patient is currently intubated on ventilator support MV: 16.7 L/min Temp (24hrs), Avg:97.4 F (36.3 C), Min:95.6 F (35.3 C), Max:99.2 F (37.3 C)  Propofol: none   Pt is s/p insertion of HD catheter for CVVH today as pt with hyperkalemia (7.1). Prior to intubation, po intake was adequate with 75-100% intake. Per Epic weight records, weight has been stable prior to admission. Of note, weight has been fluctuating since admit. RD to continue to monitor.   Pt with no observed significant fat or muscle mass loss.  Labs and  medications reviewed.   Diet Order:  Diet NPO time specified  Skin:   (Incision on chest, +2 edema)  Last BM:  10/27  Height:   Ht Readings from Last 1 Encounters:  04/24/15 5\' 11"  (1.803 m)    Weight:   Wt Readings from Last 1 Encounters:  03/11/2015 212 lb 8.4 oz (96.4 kg)    Ideal Body Weight:  78.18 kg  BMI:  Body mass index is 29.65 kg/(m^2).  Estimated Nutritional Needs:   Kcal:  2114  Protein:  130-145 grams  Fluid:  Per MD  EDUCATION NEEDS:   No education needs identified at this time  Roslyn SmilingStephanie Arizbeth Cawthorn, MS, RD, LDN Pager # [phone] After hours/ weekend pager # [phone]

## 2015-05-02 NOTE — Progress Notes (Signed)
Pt arrived to unit from 2W in severe respiratory distress, mottled, distended abdomen. An order for stat BiPap and CXR was placed. Pt was immediately put on BiPap, with in 2 minutes of BiPap being initiated pt began to brady to upper 30s-40s. Pt then respiratory arrested and went asystole. See Code Blue sheet for details of [facility] Resuscitation and ACLS.

## 2015-05-02 NOTE — Progress Notes (Signed)
[facility] Increase RR to 28 per MD. ABG @ 0500.

## 2015-05-02 NOTE — Progress Notes (Signed)
PULMONARY / CRITICAL CARE MEDICINE   Name: Donald [name] MRN: [id number] DOB: [birth year]    ADMISSION DATE:  04/18/2015 CONSULTATION DATE:  04/01/2015  REFERRING MD :  Laneta Simmers  CHIEF COMPLAINT:  Cardiac arrest  INITIAL PRESENTATION:  54 y/o male with a history of PE, Afib, COPD underwent an aortic valve replacement on 10/21 at [facility].  His post operative course was uneventful but on 10/27 he developed abdominal cramping and constipation.  He also had his pacer wires removed on 10/27.  In the evening of 10/27 he developed worsening dyspnea.  He was given bronchodilators with some relief.  However his dyspnea rapidly progressed and he was transferred to the ICU by rapid response.  In the ICU he was initially conversant but then developed PEA arrest.  He had approximately 15 minutes of down time where he had asystole, PEA, Vfib, and Vtach.  All were treated with standard ACLS resuscitation.  He regained a pulse.  An ABG showed a respiratory acidosis with adequate oxygenation.  Xarelto was restarted around 10/24.   STUDIES:  10/27 CT angiogram chest >   10/21 Aortic valve replacement   SUBJECTIVE:  2015/05/06: Near arrest right now with peak T waves, K 7.3 on istat and worsening ATN. Getting PRBC right now. Bedside ECHo unofficial repiort - low EF   VITAL SIGNS: Temp:  [97.3 F (36.3 C)-99.2 F (37.3 C)] 97.6 F (36.4 C) (10/28 0740) Pulse Rate:  [49-120] 74 (10/28 0745) Resp:  [0-31] 28 (10/28 0745) BP: (63-153)/(38-98) 63/51 mmHg (10/28 0745) SpO2:  [53 %-100 %] 68 % (10/28 0745) Arterial Line BP: (63-177)/(30-98) 88/50 mmHg (10/28 0745) FiO2 (%):  [50 %-100 %] 60 % (10/28 0715) Weight:  [96.4 kg (212 lb 8.4 oz)] 96.4 kg (212 lb 8.4 oz) (10/28 0344) HEMODYNAMICS: CVP:  [18 mmHg-23 mmHg] 21 mmHg VENTILATOR SETTINGS: Vent Mode:  [-] PRVC FiO2 (%):  [50 %-100 %] 60 % Set Rate:  [14 bmp-28 bmp] 28 bmp Vt Set:  [500 mL-600 mL] 600 mL PEEP:  [5 cmH20] 5 cmH20 Plateau  Pressure:  [18 cmH20-26 cmH20] 26 cmH20 INTAKE / OUTPUT:  Intake/Output Summary (Last 24 hours) at 06-May-2015 0917 Last data filed at 2015/05/06 0725  Gross per 24 hour  Intake 3335.65 ml  Output   1355 ml  Net 1980.65 ml    PHYSICAL EXAMINATION: General:  Looks moribund. Critically ill.  Neuro:  RASS-3 on sedation gtt HEENT:  NCAT, ETT in place Cardiovascular:  Distant heart sounds, regular Chest - dressing +. Bruised center Lungs:  Sync with vent Abdomen:  Distended abdomen, minimal bowel sounds Musculoskeletal:  Normal bulk and tone Skin:  Cyanotic, midline scar well healed but with some oozing.  LABS: PULMONARY  Recent Labs Lab 04/01/2015 1810  04/27/15 2234 05-06-15 0039 05-06-15 0221 2015/05/06 0325 05-06-15 0450 05/06/2015 0703  PHART 7.327*  --  7.234* 7.200* 7.361  --   --  7.193*  PCO2ART 46.9*  --  71.7* 51.3* 41.5  --   --  32.1*  PO2ART 58.0*  --  429.0* 126.0* 164.0*  --   --  207.0*  HCO3 24.8*  --  30.3* 20.1 23.4  --   --  12.4*  TCO2 26  < > 32 22 25 20   --  13  O2SAT 89.0  --  100.0 98.0 99.0  --  50.7 100.0  < > = values in this interval not displayed.  CBC  Recent Labs Lab 04/26/15 0250 04/27/15 2235 06-May-2015  0325 04/27/2015 0330  HGB 11.7* 9.8* 7.5* 8.2*  HCT 35.6* 29.4* 22.0* 25.4*  WBC 9.2 20.3*  --  16.5*  PLT 199 105*  --  93*    COAGULATION  Recent Labs Lab May 06, 2015 1247 04/24/15 0449 04/27/15 2235  INR 1.53* 1.63* 5.34*    CARDIAC   Recent Labs Lab 04/27/15 2313  TROPONINI 0.52*   No results for input(s): PROBNP in the last 168 hours.   CHEMISTRY  Recent Labs Lab 2015/05/06 1824  04/22/15 0510 04/22/15 1700  04/23/15 0319 04/24/15 0449 04/27/15 2235 04/27/2015 0325 04/23/2015 0330 04/13/2015 0822  NA  --   --  132*  --   < > 134* 133* 134* 133* 137 132*  K  --   --  4.2  --   < > 4.3 3.9 7.5* 6.0* 6.4* 7.1*  CL  --   --  99*  --   < > 99* 96* 91* 98* 96* 93*  CO2  --   < > 26  --   --  31 27 21*  --  18* 11*  GLUCOSE   --   --  104*  --   < > 130* 129* 90 117* 121* 80  BUN  --   --  12  --   < > 11 12 29* 41* 33* 35*  CREATININE 1.02  --  1.02 0.96  < > 1.00 0.91 2.81* 3.00* 3.22* 3.88*  CALCIUM  --   < > 7.6*  --   --  8.0* 8.3* 8.9  --  8.4* 7.7*  MG 2.9*  --  2.2 2.1  --   --   --   --   --   --   --   < > = values in this interval not displayed. Estimated Creatinine Clearance: 25.2 mL/min (by C-G formula based on Cr of 3.88).   LIVER  Recent Labs Lab 2015/05/06 1247 04/24/15 0449 04/27/15 2235 04/05/2015 0822  AST  --   --  2592* PENDING  ALT  --   --  2953* PENDING  ALKPHOS  --   --  118 123  BILITOT  --   --  1.1 2.7*  PROT  --   --  4.7* 4.5*  ALBUMIN  --   --  2.0* 2.4*  INR 1.53* 1.63* 5.34*  --      INFECTIOUS  Recent Labs Lab 04/27/15 2307 04/15/2015 0121  LATICACIDVEN 13.1* 8.8*     ENDOCRINE CBG (last 3)   Recent Labs  04/26/15 2112 04/24/2015 0827  GLUCAP 143* 76         IMAGING x48h  - image(s) personally visualized  -   highlighted in bold Dg Chest Port 1 View  04/27/2015  CLINICAL DATA:  54 year old male status post central line placement. EXAM: PORTABLE CHEST 1 VIEW COMPARISON:  Chest x-ray 04/27/2015. FINDINGS: There is a left-sided internal jugular central venous catheter with tip terminating in the proximal to mid superior vena cava. An endotracheal tube is in place with tip 5.6 cm above the carina. Transcutaneous defibrillator pads project over the left hemithorax. Status post median sternotomy for aortic valve replacement (a stented bio prosthesis is noted). Right hemithorax is incompletely imaged. Linear opacity at the base of the right hemithorax, compatible with subsegmental atelectasis or scarring, similar to the prior examination. Visualized portions of the lungs are otherwise clear. No pleural effusions. No evidence of pulmonary edema. Heart size is upper limits of normal. Upper  mediastinal contours are within normal limits. No pneumothorax. Multiple old  healed left-sided rib fractures. IMPRESSION: 1. Support apparatus, as above. 2. No pneumothorax following central line placement. 3. Subsegmental atelectasis or scarring in the right lung base. 4. Postoperative changes, as above. Electronically Signed   By: Trudie Reedaniel  Entrikin M.D.   On: 04/27/2015 23:49   Dg Chest Port 1 View  04/27/2015  CLINICAL DATA:  Code blue. Cardiac arrest. Generalized abdominal distention. Initial encounter. EXAM: PORTABLE CHEST 1 VIEW COMPARISON:  Chest radiograph performed earlier today at 12:48 p.m. FINDINGS: The patient's endotracheal tube is seen ending 6-7 cm above the carina. Minimal right basilar atelectasis is noted. No pleural effusion or pneumothorax is seen. The cardiomediastinal silhouette is borderline enlarged. The patient is status post median sternotomy. No acute osseous abnormalities are identified. 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 External pacing pads are noted IMPRESSION: 1. Endotracheal tube seen ending 6-7 cm above the carina. 2. Minimal right basilar atelectasis noted.  Lungs otherwise clear. 3. Borderline cardiomegaly. Electronically Signed   By: Roanna RaiderJeffery  Chang M.D.   On: 04/27/2015 22:48   Dg Abd Acute W/chest  04/27/2015  CLINICAL DATA:  Mid abdominal pain with shortness breath, nausea and vomiting today. Recent aortic valve replacement. Initial encounter. EXAM: DG ABDOMEN ACUTE W/ 1V CHEST COMPARISON:  Chest radiographs 04/24/2015 chest CT 02/02/2015 and abdominal CT 03/06/2014. FINDINGS: The heart size and mediastinal contours are stable status post median sternotomy. Bibasilar atelectasis is similar to the most recent radiographic examination with resulting obscuration of the nodular density previously demonstrated along the right hemidiaphragm. There may be a small amount of pleural fluid on the right. The lungs are otherwise clear. Old rib fractures are present on the left. The bowel gas pattern is nonobstructive. There is moderate stool in  the right colon and the stomach is fluid-filled. No suspicious abdominal calcifications. The bones appear unremarkable. IMPRESSION: Stable bibasilar atelectasis and probable mild abdominal ileus. No evidence of bowel obstruction or perforation. Electronically Signed   By: Carey BullocksWilliam  Veazey M.D.   On: 04/27/2015 13:17        ASSESSMENT / PLAN:  PULMONARY OETT  A: Acute respiratory failure with hypercapnea > presumably COPD exacerbation   - does not meet SBT criteria due to near arrest situation. High Fio2 need  P:    low dose solumedrol Scheduled albuterol Full vent support Daily CXR  CARDIOVASCULAR CVL L IJ CVL 10/27 >> A: Cardiac arrest 10/27 > presumably in setting of acute respiratory failure from hypoxemia; need to rule out PE, ruptured aortic valve S/P aortic valve replacement 10/21 Afib, chronic   - near arrest. Peak T waves c/w Hyperkalemia - 7.3. Severe circulatory shock Also low EF. Already on levophed  P:  Cardiology consult Echocardiogram STAT DC amio given shock Continue levophed Start EPI gtt Goal sbp > 95   RENAL A:  Hyperkalemic, acidotic, lactic acidosi and ATN  P:   Stop PRBC due to high K STat D10/insulin, bicarb amp, albuterol neb, calcium chloride Stat renal consult for CVVH urgent Place HD cath (INR 10 give FFP and do HD cath) Start bicarb gtt    GASTROINTESTINAL A:  Abdominal distension, constipation P:   Place NG tube for decompression  HEMATOLOGIC A:  History of PE   - Coagulopathic and thrombocytopenic post arrest.   P:  Hold Xarelto FFP stat  INFECTIOUS A:  No acute infectious issues P:   Start cefazolin for surgical wound prophylaxis after CPR    ENDOCRINE A:  No acute issues   P:   Monitor glucose  NEUROLOGIC A:  S/P cardiac arrest, making purposeful movements 04/27/15  - on fent and versed gtt with rASS -3  P:   RASS goal: -3 Cotninuous t fentanyl, intermittent versed   FAMILY  - Updates: none immediate.  PEr RN no family   GLOBAL Near death. Coagulopathich. ATN and acidotic. Place HD cath after FFP. Cards consult. Renal consult     The patient is critically ill with multiple organ systems failure and requires high complexity decision making for assessment and support, frequent evaluation and titration of therapies, application of advanced monitoring technologies and extensive interpretation of multiple databases.   Critical Care Time devoted to patient care services described in this note is  45  Minutes. This time reflects time of care of this signee Dr Kalman Shan. This critical care time does not reflect procedure time, or teaching time or supervisory time of PA/NP/Med student/Med Resident etc but could involve care discussion time    Dr. Kalman Shan, M.D., [facility].C.P Pulmonary and Critical Care Medicine Staff Physician [facility] Pager: [phone], If no answer or between  15:00h - 7:00h: call [phone]  2015/05/18 9:31 AM

## 2015-05-02 NOTE — Progress Notes (Signed)
CRITICAL VALUE ALERT  Critical value received:  Lactic acid=19.2  Date of notification:  04/08/2015  Time of notification:  1540  Critical value read back:Yes.    Nurse who received alert:  Deberah PeltonKari Adamaris King, Rn  MD notified (1st page):  Dr. Laneta SimmersBartle at bedside. Made aware. Will continue to monitor.

## 2015-05-02 NOTE — Progress Notes (Signed)
  Echocardiogram 2D Echocardiogram has been performed.  Donald [name], Donald [name] 04/10/2015, 9:00 AM

## 2015-05-02 NOTE — Consult Note (Addendum)
 CARDIOLOGY CONSULT NOTE   Patient ID: Donald Donald [name] MRN: [id number], DOB/AGE: [birth year]   Admit date: 05-20-2015 Date of Consult: 04/05/2015   Primary Physician: Donald Donald [name] Primary Cardiologist: Dr. Delton [name] (previously Dr. Dot Been [name])  Pt. Profile  54 year old Caucasian male with past medical history of PE, polysubstance abuse, atrial fibrillation on amiodarone, EtOH abuse, and severe AS who underwent AVR during this admission had cardiac arrest after having acute resp distress   Problem List  Past Medical History  Diagnosis Date  . Substance abuse   . A-fib (HCC)   . Hypertension   . Arthritis   . COPD (chronic obstructive pulmonary disease) (HCC)   . Dysrhythmia     Afib  . Shortness of breath dyspnea     with exertion  . GERD (gastroesophageal reflux disease)   . Constipation   . PE (pulmonary embolism)     11/24/14 right PE (HPR)  . Stroke (HCC) 02/27/15     TIA 02/27/15; no residual  . Sleep apnea   . Severe aortic stenosis   . Pneumonia     Past Surgical History  Procedure Laterality Date  . Tonsillectomy    . Cardiac catheterization N/A 02/28/2015    Procedure: Right/Left Heart Cath and Coronary Angiography;  Surgeon: Donald Records, MD;  Location: Donald [name];  Service: Cardiovascular;  Laterality: N/A;  . Finger surgery Left     Index finger , tried to replant, unsuccessful  . Hand surgery Left     fracture  . Multiple extractions with alveoloplasty N/A 03/09/2015    Procedure: Extraction of tooth #'s 2,5,8,9,10,16 with alveoloplasty and gross debridement of remaining dentition;  Surgeon: Donald Donald [name], DDS;  Location: Donald Donald [name];  Service: Oral Surgery;  Laterality: N/A;  . Aortic valve replacement N/A 05-20-2015    Procedure: AORTIC VALVE REPLACEMENT (AVR);  Surgeon: Donald Borne, MD;  Location: Donald Donald [name];  Service: Open Heart Surgery;  Laterality: N/A;  . Tee without cardioversion N/A 20-May-2015    Procedure: TRANSESOPHAGEAL  ECHOCARDIOGRAM (TEE);  Surgeon: Donald Borne, MD;  Location: [name];  Service: Open Heart Surgery;  Laterality: N/A;     Allergies  No Known Allergies  HPI   The patient is a 54 year old Caucasian male with past medical history of PE, polysubstance abuse, atrial fibrillation on amiodarone, EtOH abuse, and severe AS who underwent AVR during this admission. He was seen in August 2016 for elevated troponin in the setting of left sided facial droop and slurring of speech. Neural exam at that time was negative for acute stroke. CT of the brain negative. MRI of the head showed mild intracranial atherosclerotic changes without significant vessel occlusion and no acute infarct. Echocardiogram obtained on 02/27/2015 showed EF 50-55%, grade 1 diastolic dysfunction, moderate to severe AS with peak velocity 392, mean gradient 35. Given elevated troponin, he underwent cardiac catheterization on 02/28/2015 which showed normal coronary, severe aortic stenosis with calculated aortic valve area 0.74 cm based on peak transaortic valve gradient of 57 mmHg, mean pressure 47 mmHg, cardiac output 5.7, cardiac index 2.68, normal left ventricular end-diastolic pressure. He underwent dental extraction on 9/80/2016 in preparation for AVR procedure.  Patient presented for the scheduled AVR surgery on 05-20-15 and had 23 mm Donald Donald [name] pericardial valve placed via median sternotomy. His Xarelto was restarted on 04/23/2015. Postop Donald [name] was complicated by rapid A. fib on 10/23 and he was started on Donald Donald [name] and amiodarone (give IV bolus, followed  by PO 200mg  amio) with good rate control. His digoxin was increased to 0.25. He converted back to normal sinus rhythm on 10/25. He was later resumed on Lopressor 25 mg twice a day. Diltiazem was decreased to 60 mg every 6 hours. Patient was doing well afterward and it was expected to be discharged around 10/27. His Lopressor was increased to 50 mg twice a day. It was noted his  weight was still 4 pounds over preop level and he has mild pedal edema, he was given one dose of Lasix. He was consider stable for discharge waiting for transportation.   On the night of 10/27, he has been in the bathroom and was walking back to the bed when he developed shortness of breath. O2 saturation was 85% and he was placed on nonrebreather. He was later transferred to 2 ICU by rapid response, shortly after, he went into cardiac arrest during which he had asystole, PEA, v-fib and V-tach. Post cardiac arrest, he was intubated, central line, arterial line was placed. He had multiorgan failure including kidney, liver and pulmonary. His creatinine jumped to 2.1 on the night of 10/27 and peaked at 3.22 on the following morning. Elevated transaminase consistent was shocked liver. Lactic acid trended up to 18.7. Hemoglobin dropped from 11 two days ago to 7.5. Pulmonary critical care was consulted, possible differential diagnosis include PE and aortic valve tear. Unfortunately patient is unable to obtain CTA of the chest due to failing kidney. A repeat echocardiogram was done on 10/28, the study was extremely limited despite using Definity, EF was felt to be 50-55%, hypokinesis of basalmid inferolateral myocardium, moderate pericardial effusion identified apex, no obvious signs of tamponade. Cardiology has been consulted for cardiac arrest.   Inpatient Medications  . albuterol  2.5 mg Nebulization Q4H  . antiseptic oral rinse  7 mL Mouth Rinse QID  . budesonide-formoterol  2 puff Inhalation BID  .  ceFAZolin (ANCEF) IV  2 g Intravenous Q12H  . chlorhexidine gluconate  15 mL Mouth Rinse BID  . famotidine (PEPCID) IV  20 mg Intravenous Q12H  . methylPREDNISolone (SOLU-MEDROL) injection  60 mg Intravenous Q12H  . sodium chloride  3 mL Intravenous Q12H    Family History Family History  Problem Relation Age of Onset  . Heart disease Father      Social History Social History   Social History  .  Marital Status: Single    Spouse Name: N/A  . Number of Children: N/A  . Years of Education: N/A   Occupational History  . Not on file.   Social History Main Topics  . Smoking status: Current Some Day Smoker -- 1.00 packs/day for 44 years    Types: Cigarettes  . Smokeless tobacco: Never Used  . Alcohol Use: No  . Drug Use: 7.00 per week    Special: Cocaine, "Crack" cocaine     Comment: " last used crack cocaine  2-3 weeks ago " 04/19/15  . Sexual Activity: Not on file     Comment: crack cocaine this morning at 0300   Other Topics Concern  . Not on file   Social History Narrative     Review of Systems  Patient is intubated and sedated. Unable to obtain ROS  Physical Exam  Blood pressure 83/45, pulse 84, temperature 96.9 F (36.1 C), temperature source Rectal, resp. rate 28, height 5\' 11"  (1.803 m), weight 212 lb 8.4 oz (96.4 kg), SpO2 84 %.  General: Intubated and sedated Neuro: Intubated and sedated  HEENT: Normal  Neck: -JVD. Lungs:  Resp regular and unlabored, anterior exam CTA. Heart: RRR no s3, s4, [name] murmurs. Covered in baer hugger Abdomen: Soft, non-tender, non-distended, BS + x 4.  Extremities: No clubbing, cyanosis [name] edema.  Labs   Recent Labs  04/27/15 2313  TROPONINI 0.52*   [name] Results  Component Value Date   WBC 16.5* 04/09/2015   HGB 8.2* 04/08/2015   HCT 25.4* 04/27/2015   MCV 89.4 04/16/2015   PLT 93* 04/23/2015     Recent Labs [name] 04/22/2015 0822  NA 132*  K 7.1*  CL 93*  CO2 11*  BUN 35*  CREATININE 3.88*  CALCIUM 7.7*  PROT 4.5*  BILITOT 2.7*  ALKPHOS 123  ALT 8573*  AST >10000*  GLUCOSE 80   [name] Results  Component Value Date   CHOL 208* 02/28/2015   HDL 44 02/28/2015   LDLCALC 144* 02/28/2015   TRIG 98 02/28/2015   No results found for: DDIMER  Radiology/Studies  Dg Chest 2 View  04/19/2015  CLINICAL DATA:  Cough. EXAM: CHEST  2 VIEW COMPARISON:  None. FINDINGS: Mediastinum and hilar structures normal. Low lung  volumes with mild basilar atelectasis. Borderline cardiomegaly with normal pulmonary vascularity. No pleural effusion [name] pneumothorax. Degenerative changes thoracic spine. IMPRESSION: 1.  Low lung volumes with mild basilar atelectasis. 2.  Borderline cardiomegaly.  No pulmonary venous congestion. Electronically Signed   By: Maisie Fus  Register   On: 04/19/2015 12:48   Dg Chest Port 1 View  04/25/2015  CLINICAL DATA:  Status post intubation. EXAM: PORTABLE CHEST 1 VIEW COMPARISON:  Single view of the chest 04/27/2015. FINDINGS: Endotracheal tube is in place with tip in good position just below the clavicular heads. Left IJ catheter and NG tube are unchanged. Lungs are clear. Heart size is mildly enlarged. No pneumothorax [name] pleural effusion. Defibrillator pads noted. IMPRESSION: Endotracheal tube projects in good position. Cardiomegaly without acute disease. Electronically Signed   By: Drusilla Kanner M.D.   On: 04/26/2015 10:06   Dg Chest Port 1 View  04/27/2015  CLINICAL DATA:  54 year old male status post central line placement. EXAM: PORTABLE CHEST 1 VIEW COMPARISON:  Chest x-ray 04/27/2015. FINDINGS: There is a left-sided internal jugular central venous catheter with tip terminating in the proximal to mid superior vena cava. An endotracheal tube is in place with tip 5.6 cm above the carina. Transcutaneous defibrillator pads project over the left hemithorax. Status post median sternotomy for aortic valve replacement (a stented bio prosthesis is noted). Right hemithorax is incompletely imaged. Linear opacity at the base of the right hemithorax, compatible with subsegmental atelectasis [name] scarring, similar to the prior examination. Visualized portions of the lungs are otherwise clear. No pleural effusions. No evidence of pulmonary edema. Heart size is upper limits of normal. Upper mediastinal contours are within normal limits. No pneumothorax. Multiple old healed left-sided rib fractures. IMPRESSION: 1.  Support apparatus, as above. 2. No pneumothorax following central line placement. 3. Subsegmental atelectasis [name] scarring in the right lung base. 4. Postoperative changes, as above. Electronically Signed   By: Trudie Reed M.D.   On: 04/27/2015 23:49   Dg Chest Port 1 View  04/27/2015  CLINICAL DATA:  Code blue. Cardiac arrest. Generalized abdominal distention. Initial encounter. EXAM: PORTABLE CHEST 1 VIEW COMPARISON:  Chest radiograph performed earlier today at 12:48 p.m. FINDINGS: The patient's endotracheal tube is seen ending 6-7 cm above the carina. Minimal right basilar atelectasis is noted. No pleural effusion [name] pneumothorax is seen.  The cardiomediastinal silhouette is borderline enlarged. The patient is status post median sternotomy. No acute osseous abnormalities are identified. 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 External pacing pads are noted IMPRESSION: 1. Endotracheal tube seen ending 6-7 cm above the carina. 2. Minimal right basilar atelectasis noted.  Lungs otherwise clear. 3. Borderline cardiomegaly. Electronically Signed   By: Roanna Raider M.D.   On: 04/27/2015 22:48   Dg Chest Port 1 View  04/24/2015  CLINICAL DATA:  Three days postop from aortic valve replacement. History of recurrent atrial fibrillation/flutter. EXAM: PORTABLE CHEST 1 VIEW COMPARISON:  04/23/2015 FINDINGS: Changes from recent cardiac surgery are stable. Cardiac silhouette is normal in overall size and configuration. There is no mediastinal widening. Right greater than left lung base opacity is mildly improved consistent with improved atelectasis. There is no evidence of pneumonia [name] pulmonary edema. No pneumothorax. Right internal jugular introducer sheath is stable and well positioned. IMPRESSION: 1. No acute findings.  No evidence of an operative complication. 2. Mild improvement and lung base atelectasis.  No other change. Electronically Signed   By: Amie Portland M.D.   On: 04/24/2015 09:48   Dg  Chest Port 1 View  04/23/2015  CLINICAL DATA:  Post AVR on 04/23/2015, hypertension, atrial fibrillation, COPD, GERD, stroke, prior pulmonary embolism EXAM: PORTABLE CHEST 1 VIEW COMPARISON:  Portable exam 0541 hours compared to 04/22/2015 FINDINGS: Interval removal of mediastinal drain and Swan-Ganz catheter. RIGHT jugular central venous catheter tip projects over confluence of SVC. Enlargement of cardiac silhouette post median sternotomy and AVR. Pulmonary vascular congestion. Bibasilar atelectasis. Lungs otherwise clear. No pleural effusion [name] pneumothorax. Old LEFT rib fractures again noted. IMPRESSION: Bibasilar atelectasis. Enlargement of cardiac silhouette with vascular congestion post AVR. Electronically Signed   By: Ulyses Southward M.D.   On: 04/23/2015 08:44   Dg Chest Port 1 View  04/22/2015  CLINICAL DATA:  Post aortic valve replacement. EXAM: PORTABLE CHEST 1 VIEW COMPARISON:  04/02/2015 FINDINGS: Sternotomy wires unchanged. Right IJ Swan-Ganz catheter with tip over the main pulmonary artery segment. Mediastinal drain unchanged. Interval removal of nasogastric tube and endotracheal tube. Prosthetic aortic valve unchanged. Lungs are adequately inflated without consolidation, effusion [name] pneumothorax. Cardiomediastinal silhouette and remainder of the exam is unchanged. IMPRESSION: No acute cardiopulmonary disease. Tubes and lines as described. Electronically Signed   By: Elberta Fortis M.D.   On: 04/22/2015 09:09   Dg Chest Port 1 View  04/10/2015  CLINICAL DATA:  Status post aortic valve repair EXAM: PORTABLE CHEST - 1 VIEW COMPARISON:  04/19/2015 FINDINGS: Cardiac shadow is within normal limits. Postsurgical changes are seen. A Swan-Ganz catheter is noted at the level of the pulmonary outflow tract. A nasogastric catheter extends into the stomach. An endotracheal tube is seen 10 cm above the carina at the level of thoracic inlet. Mediastinal drain is noted. No pneumothorax [name] focal infiltrate is  seen. IMPRESSION: Tubes and lines as described above. Postsurgical changes without acute abnormality. Electronically Signed   By: Alcide Clever M.D.   On: 04/27/2015 13:13   Dg Abd Acute W/chest  04/27/2015  CLINICAL DATA:  Mid abdominal pain with shortness breath, nausea and vomiting today. Recent aortic valve replacement. Initial encounter. EXAM: DG ABDOMEN ACUTE W/ 1V CHEST COMPARISON:  Chest radiographs 04/24/2015 chest CT 02/02/2015 and abdominal CT 03/06/2014. FINDINGS: The heart size and mediastinal contours are stable status post median sternotomy. Bibasilar atelectasis is similar to the most recent radiographic examination with resulting obscuration of the nodular density previously demonstrated along  the right hemidiaphragm. There may be a small amount of pleural fluid on the right. The lungs are otherwise clear. Old rib fractures are present on the left. The bowel gas pattern is nonobstructive. There is moderate stool in the right colon and the stomach is fluid-filled. No suspicious abdominal calcifications. The bones appear unremarkable. IMPRESSION: Stable bibasilar atelectasis and probable mild abdominal ileus. No evidence of bowel obstruction [name] perforation. Electronically Signed   By: Carey Bullocks M.D.   On: 04/27/2015 13:17    ECG  NSR with ST elevation in inferior lead    ASSESSMENT AND PLAN  1. Cardiac arrest of unclear etiology  - 15 min and 3 shocks before ROSC   - possible DD include PE, dislodgement of aortic valve vs electrolyte imbalance  - No obvious sign of torsade, QTc was not prolonged after arrest.  - his alternating afib and NSR make telemetry very difficult to read. MD to [name] telemetry from 22:00 to 22:25 pm last night.   - it appears patient had too long sinus pause before going into v-tach and v-fib. Although metoprolol was increased to 50mg  yesterday, this is unlikely the primary cause as 50mg  BID metoprolol was his home dose prior to this admission.   - Last  K prior to cardiac arrest was 10/24, K 3.9. Post cardiac arrest, K was 6.0. At the same time, hgb dropped from previous 11 to 9.8 after arrest and then 7.5. Hemolyzing vs hydration  - echo image was a difficult study, not sure if EF 50-55% is accurate, AV was poorly visualized due to poor image. Does have moderate pericardial effusion, but no temponade, moderately reduced RV function  - given moderately reduced RV function, higher suspicion for PE, esp since he was doing well and had sudden onset of respiratory failure.  - noted ST elevation in inferior leads, unclear probability of embolic event, noted normal coronaries on cath 02/28/2015, will obtain repeat EKG   2. Multiorgan failure with lactic acidosis  - include AKI, resp failure and liver failure   3. Cardiogenic shock  - on 45 levo and 12 epi with SBP 70-80s.    4. Severe AS s/p 23 mm Donald Donald [name] pericardial valve 10/21  5. Paroxysmal atrial fibrillation, currently in NSR  6. H/o polysubstance abuse  7. Hyperkalemia  8. Severe anemia  Signed, Donald Course, PA-C , 12:29 PM   The patient was seen, examined and discussed with Donald Course, PA-C and I agree with the above.   The patient is a 54 year old Caucasian male with past medical history of PE, polysubstance abuse, atrial fibrillation on amiodarone, EtOH abuse, and severe AS who underwent AVR on 04-30-15 with 23 mm pericardial tissue valve, he underwent a cardiac cath on 02/28/15 that showed normal coronaries.  He had fairly uncomplicated post op Donald [name], with an episode of a-fib with RVR that cardioverted with amiodarone, his Xarelto was restarted on 04/23/2015. Patient was doing well afterward and it was expected to be discharged yesterday when he developed  Sudden onset SOB,  With low O2 saturation of 85%, and he was placed on nonrebreather. He was later transferred to 2 ICU by rapid response, shortly after, he went intdeveloped multiorgan failure including kidney,  liver and pulmonary and DIC with anemia and thrombocytopenia.Marland Kitchen His creatinine jumped to 3.8 from 0.9, K over 7, started on CVVHD,  He has severely elevated transaminase > 10,000 consistent was shocked liver. Lactic acid trended up to 18.7. Hemoglobin dropped from 11 two days ago  to 7.5. Troponin 0.52, not repeated this am yet. [name] shows inferior ST elevations and ST depressions in aVR.  Possible differential diagnosis include - pulmonary embolism - low probability considering anticoagulation with xarelto - aortic dissection - he has poor echo images, techs sent back to image suprasternally  - acute RCA occlusion with RV infarct - echocardiogram shows hypokinesis of the basal and mid inferior and inferolateral walls, LVEF approximately 30-35%, and moderately decreased RV function. It is unclear why would he have RCA infarct if he had normal coronaries 2 months ago, a possible explanation would be aortic dissection [name] embolization.   Unfortunately remains in critical condition with multiorgan failure and cardiogenic shock with hypotension despite high doses of pressors (levophed 45 mcg/min and epinephrine 12 mcg/min). We will avoid cath for now.  We will follow echo for possible aortic dissection (unable to do CT with AKI). I will preform bedside TEE to rule out aortic dissection. If negative we will consider IABP and placement of a Swan Ganz catheter.  Donald Donald [name] May 13, 2015

## 2015-05-02 NOTE — Progress Notes (Signed)
RT called by RN to bring BIPAP for patient. Pt had just arrived from 2W following Rapid Response. Upon RT arrival pt had AMS,diaphoretic, labored/shallow respirations. Extremities/Lips cyanotic. RR 40+, Sats 82%(NRB). Pt RT managed to get BIPAP (16/8; RR: 10, FiO2 100%) on patient. Within 3-4 minutes patient began to brady down into 30's. CODE called. Pt removed from BIPAP. Pt apneic/ RT manually bagged until Scarlette CalicoFrances, RT arrived Scarlette CalicoFrances attempted intubation while waiting anesthesia to arrive. MD at bedside. Anesthesia arrived and intubated patient w/ 8.0ETT @ 23ATL. Positive capnography, Equal BBS auscutlated. C-XRAY verified ETT placement. Pt placed on vent following return of pulse. RT will monitor patient.

## 2015-05-02 NOTE — Procedures (Signed)
Central Venous Catheter Insertion Procedure Note Carlyn ReichertLarry E Winski [id number] 05/03/1961  Procedure: Insertion of Central Venous Catheter Indications: Assessment of intravascular volume, Drug and/or fluid administration and Frequent blood sampling  Procedure Details Consent: Unable to obtain consent because of emergent medical necessity. Time Out: Verified patient identification, verified procedure, site/side was marked, verified correct patient position, special equipment/implants available, medications/allergies/relevent history reviewed, required imaging and test results available.  Performed  Maximum sterile technique was used including antiseptics, cap, gloves, gown, hand hygiene, mask and sheet. Skin prep: Chlorhexidine; local anesthetic administered A antimicrobial bonded/coated triple lumen catheter was placed in the left internal jugular vein using the Seldinger technique. Ultrasound guidance used.Yes.   Catheter placed to 20 cm. Blood aspirated via all 3 ports and then flushed x 3. Line sutured x 2 and dressing applied.  Evaluation Blood flow good Complications: No apparent complications Patient did tolerate procedure well. Chest X-ray ordered to verify placement.  CXR: pending.  Joneen RoachPaul Hoffman, AGACNP-BC [facility]/Critical Care Pager [phone] or [phone]  04-Nov-2014 1:02 AM

## 2015-05-02 NOTE — Progress Notes (Signed)
CRITICAL VALUE ALERT  Critical value received:  PT>90, INR>10  Date of notification:  04/30/2015  Time of notification:  1414  Critical value read back:Yes.    Nurse who received alert:  Deberah PeltonKari Bryer Gottsch, RN  MD notified (1st page):  Dr. Laneta SimmersBartle at bedside. Made aware. Will continue to monitor.

## 2015-05-02 NOTE — Care Management Note (Signed)
Case Management Note Donn PieriniKristi Webster RN, BSN Unit 2W-Case Manager [phone]  Patient Details  Name: Donald ReichertLarry E [name] MRN: [id number] Date of Birth: 12-Sep-1960  Subjective/Objective:   Pt admitted s/p AVR                 Action/Plan: PTA pt lived at home with a friend- spoke with pt at bedside regarding d/c plans and needs- per conversation with pt- he states that [place]- [place]- reports that his friend is home most of the time does not drive- is disabled and receives PCS that helps them clean and cooks breakfast- pt states that he and his friend usually prepare the other meals- pt reports that they have other friends that can assist in going to the store to get groceries and such. Pt states that he does not feel like he will need any DME on discharge- [facility] if [facility] feels like he needs them. Pt plans [place]. Pt states that he will not consider STSNF as he plans to return home.  Previous note enterered by Donn PieriniKristi Webster, NCM  Expected Discharge Date:   (pending)               Expected Discharge Plan:  Home/Self Care  In-House Referral:     Discharge planning Services  CM Consult  Post Acute Care Choice:    Choice offered to:     DME Arranged:    DME Agency:     HH Arranged:    HH Agency:     Status of Service:  In process, will continue to follow  Medicare Important Message Given:  Yes-second notification given Date Medicare IM Given:    Medicare IM give by:    Date Additional Medicare IM Given:    Additional Medicare Important Message give by:     If discussed at Long Length of Stay Meetings, dates discussed:  04/27/15  Additional Comments:  Post op PEA arrest - vent, pressors.   Vangie BickerBrown, Tenesha Garza Jane, RN 04/06/2015, 10:50 AM

## 2015-05-02 DEATH — deceased

## 2015-05-04 ENCOUNTER — Ambulatory Visit: Payer: Medicare HMO | Admitting: Neurology

## 2015-05-09 LAB — BENZODIAZEPINES,MS,WB/SP RFX
7-AMINOCLONAZEPAM: NEGATIVE ng/mL
Alprazolam: NEGATIVE ng/mL
Benzodiazepines Confirm: POSITIVE
Chlordiazepoxide: NEGATIVE ng/mL
Clonazepam: NEGATIVE ng/mL
DESMETHYLDIAZEPAM: NEGATIVE ng/mL
DIAZEPAM: NEGATIVE ng/mL
Desalkylflurazepam: NEGATIVE ng/mL
Desmethylchlordiazepoxide: NEGATIVE ng/mL
FLURAZEPAM: NEGATIVE ng/mL
Lorazepam: NEGATIVE ng/mL
Midazolam: 174 ng/mL
OXAZEPAM: NEGATIVE ng/mL
TEMAZEPAM: NEGATIVE ng/mL
Triazolam: NEGATIVE ng/mL

## 2015-05-10 LAB — OPIATES,MS,WB/SP RFX
6-Acetylmorphine: NEGATIVE
CODEINE: NEGATIVE ng/mL
DIHYDROCODEINE: NEGATIVE ng/mL
HYDROMORPHONE: NEGATIVE ng/mL
Hydrocodone: NEGATIVE ng/mL
Morphine: NEGATIVE ng/mL
OPIATE CONFIRMATION: NEGATIVE

## 2015-05-14 LAB — DRUG SCREEN 10 W/CONF, SERUM
Amphetamines, IA: NEGATIVE ng/mL
Barbiturates, IA: NEGATIVE ug/mL
Benzodiazepines, IA: POSITIVE ng/mL
COCAINE & METABOLITE, IA: NEGATIVE ng/mL
Methadone, IA: NEGATIVE ng/mL
OPIATES, IA: NEGATIVE ng/mL
OXYCODONES, IA: POSITIVE ng/mL
PHENCYCLIDINE, IA: NEGATIVE ng/mL
Propoxyphene, IA: NEGATIVE ng/mL
THC(Marijuana) Metabolite, IA: NEGATIVE ng/mL

## 2015-05-14 LAB — OXYCODONES,MS,WB/SP RFX
OXYCODONES CONFIRMATION: POSITIVE
OXYMORPHONE: 2 ng/mL
Oxycocone: 14.6 ng/mL

## 2015-06-14 ENCOUNTER — Encounter (HOSPITAL_COMMUNITY): Payer: Self-pay | Admitting: *Deleted

## 2016-07-31 IMAGING — DX DG ABDOMEN ACUTE W/ 1V CHEST
3 series · 3 of 3 positions shown · non-contrast
Comparison: Chest radiographs 04/24/2015 chest CT 02/02/2015 and
abdominal CT 03/06/2014.

CLINICAL DATA: Mid abdominal pain with shortness breath, nausea and
vomiting today. Recent aortic valve replacement. Initial encounter.

EXAM:
DG ABDOMEN ACUTE W/ 1V CHEST

[chest pa]
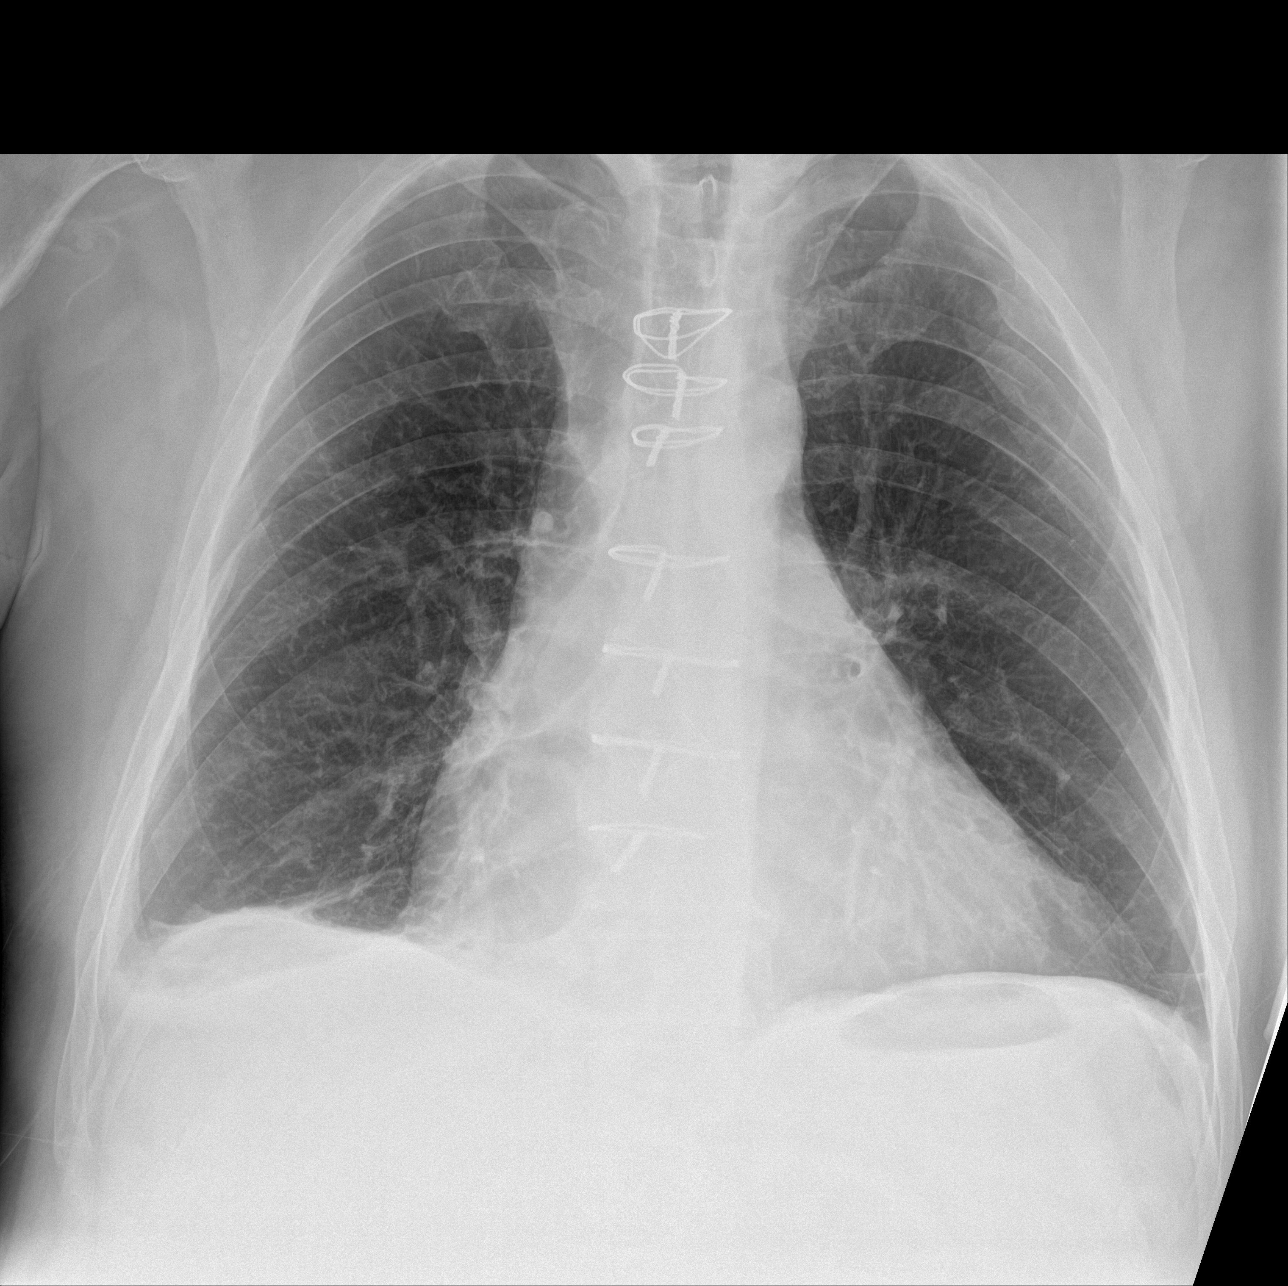

[abdomen erect]
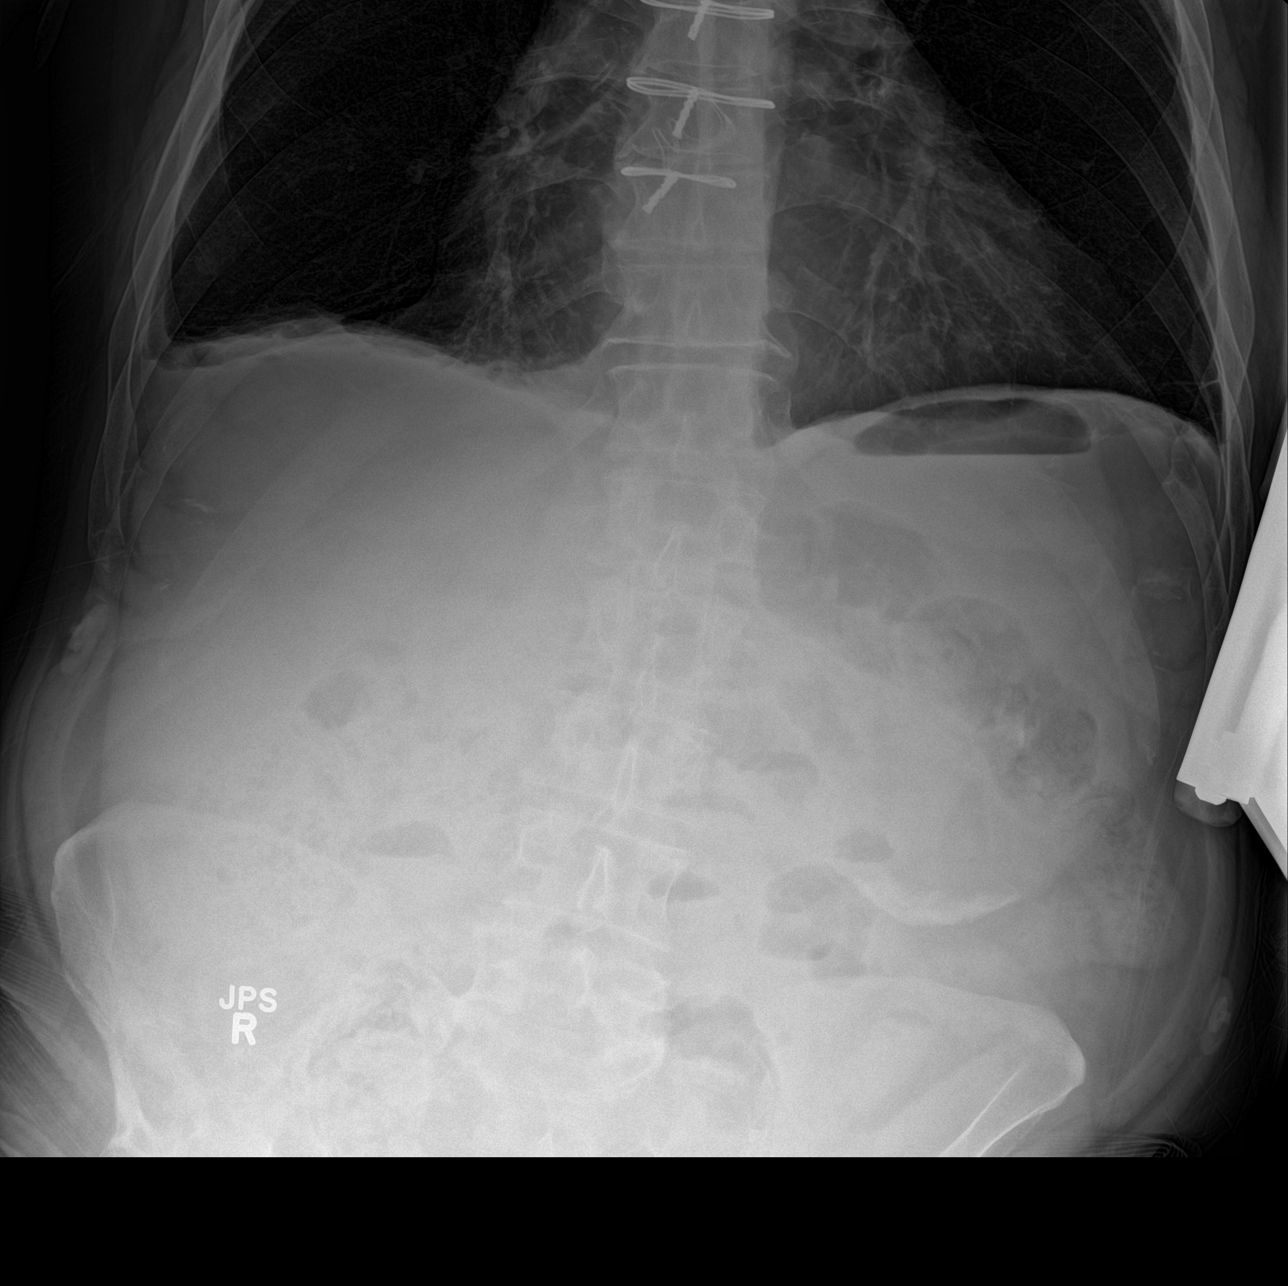

[abdomen supine]
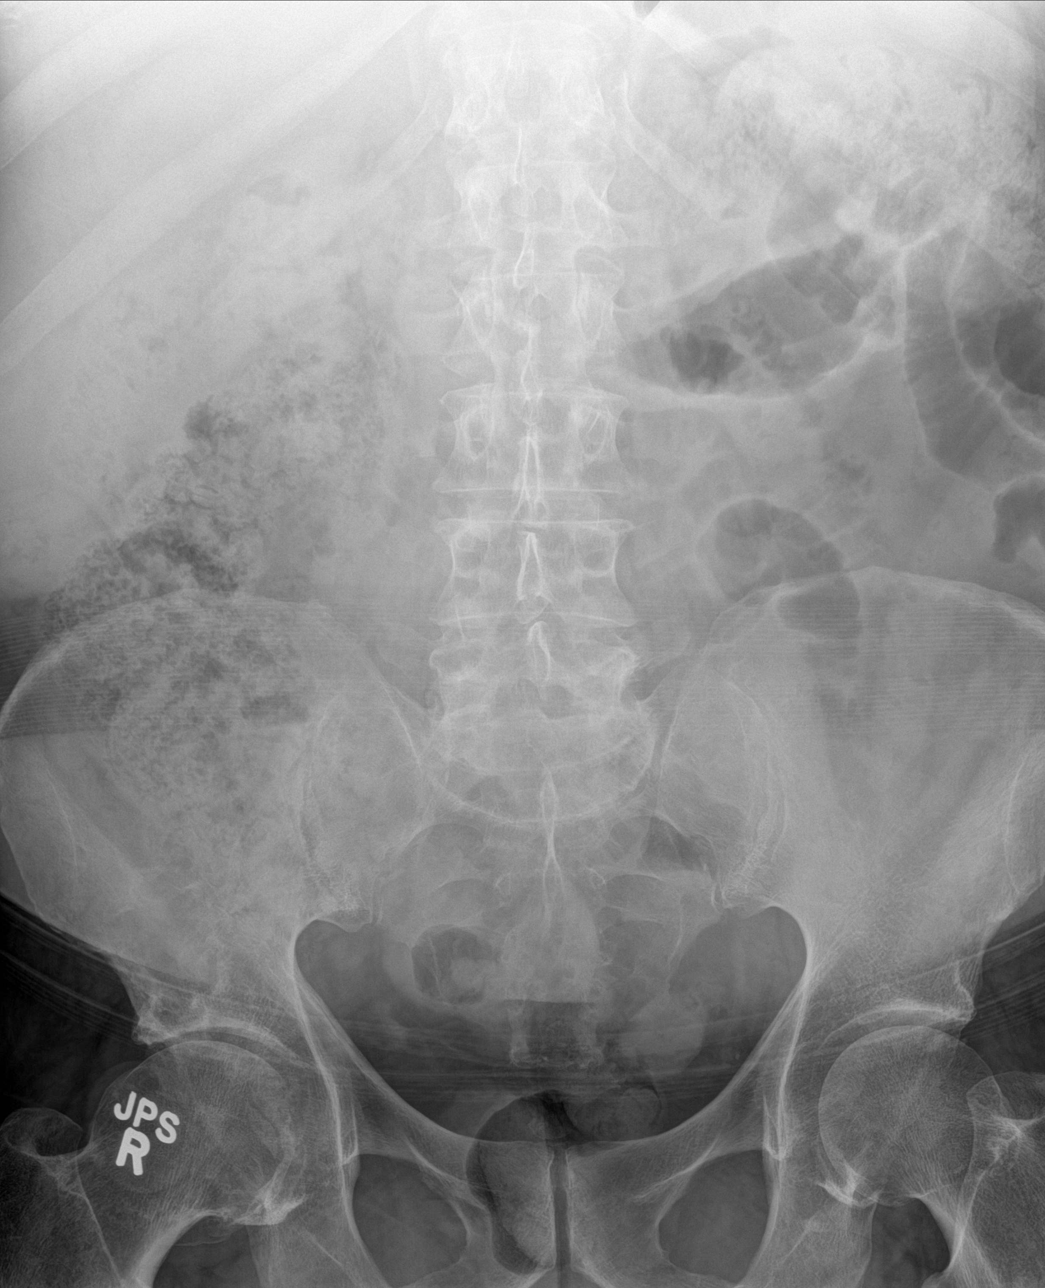

[3 of 3 positions shown; findings below may reference images not displayed]

FINDINGS: The heart size and mediastinal contours are stable status post
median sternotomy. Bibasilar atelectasis is similar to the most
recent radiographic examination with resulting obscuration of the
nodular density previously demonstrated along the right
hemidiaphragm. There may be a small amount of pleural fluid on the
right. The lungs are otherwise clear. Old rib fractures are present
on the left.

The bowel gas pattern is nonobstructive. There is moderate stool in
the right colon and the stomach is fluid-filled. No suspicious
abdominal calcifications. The bones appear unremarkable.
IMPRESSION: Stable bibasilar atelectasis and probable mild abdominal ileus. No
evidence of bowel obstruction or perforation.

## 2016-07-31 IMAGING — DX DG CHEST 1V PORT
1 series · 1 of 1 positions shown · non-contrast
Comparison: Chest radiograph performed earlier today at [DATE] p.m.

CLINICAL DATA: Code blue. Cardiac arrest. Generalized abdominal
distention. Initial encounter.

EXAM:
PORTABLE CHEST 1 VIEW

[chest ap]
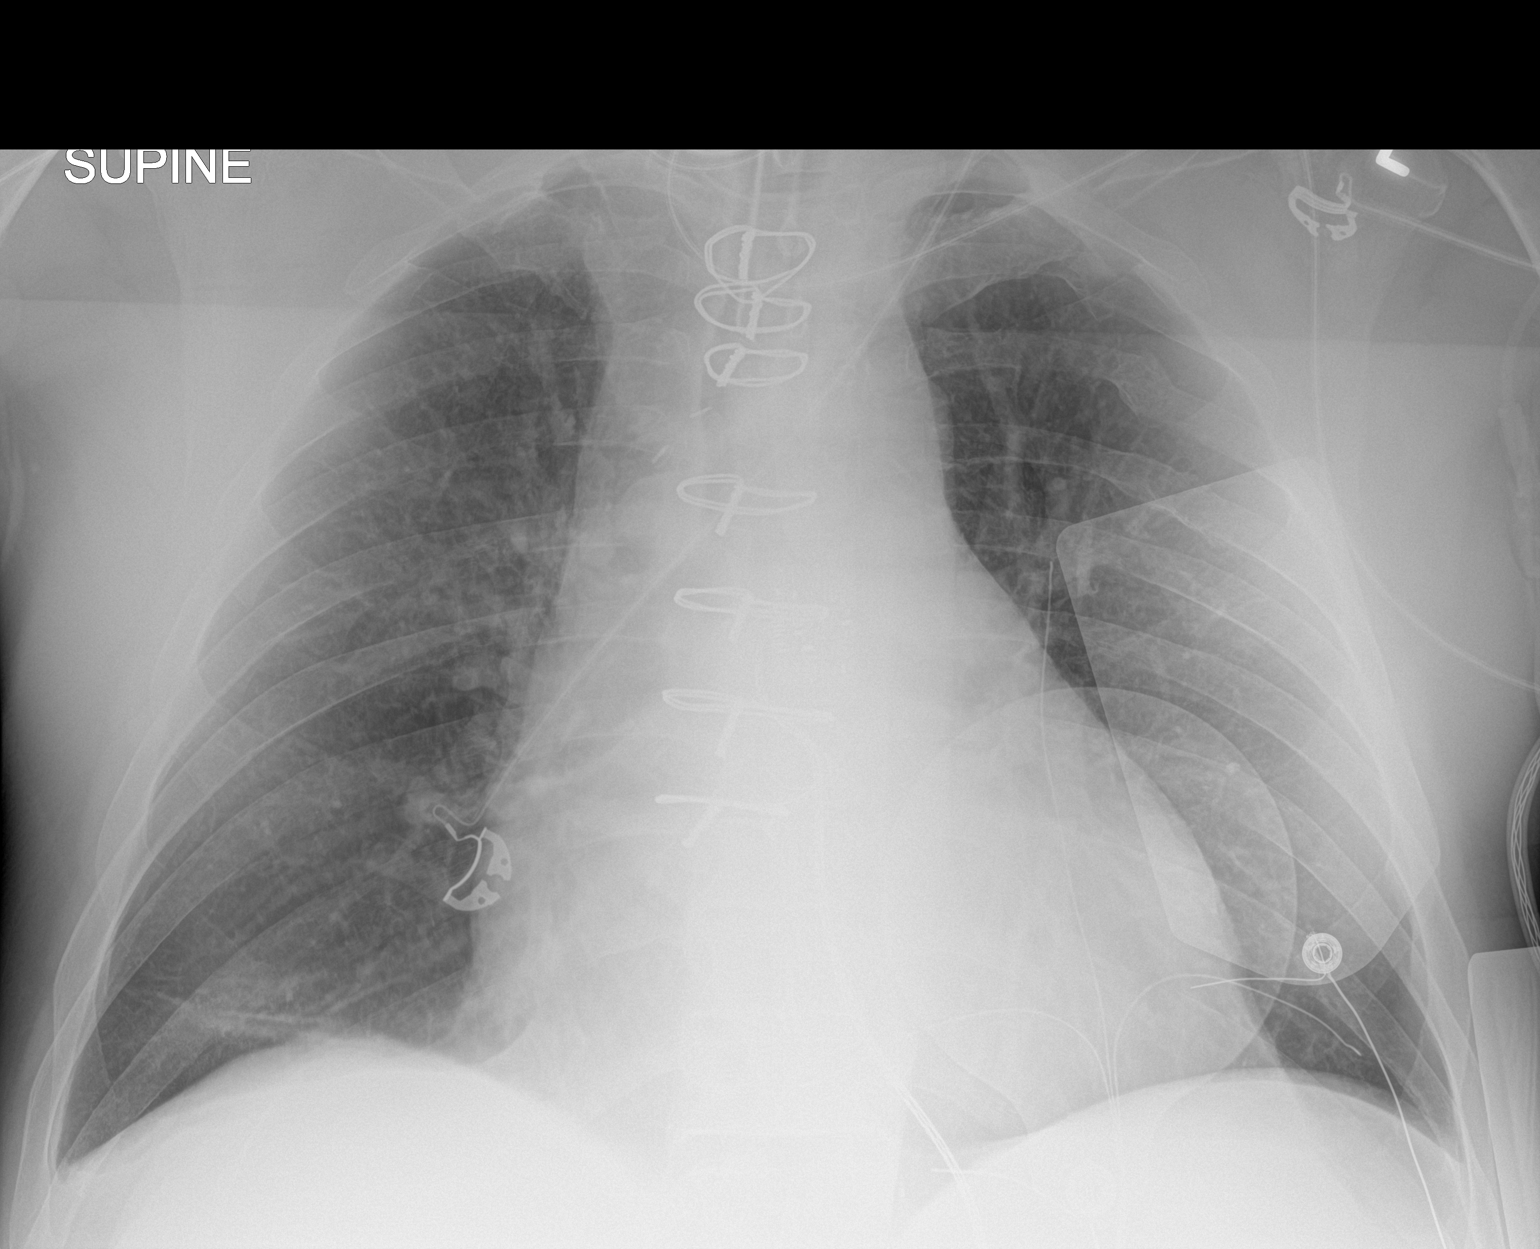

[1 of 1 positions shown; findings below may reference images not displayed]

FINDINGS: The patient's endotracheal tube is seen ending 6-7 cm above the
carina.

Minimal right basilar atelectasis is noted. No pleural effusion or
pneumothorax is seen.

The cardiomediastinal silhouette is borderline enlarged. The patient
is status post median sternotomy. No acute osseous abnormalities are
identified. Chronic left-sided rib deformities are seen. External
pacing pads are noted
IMPRESSION: 1. Endotracheal tube seen ending 6-7 cm above the carina.
2. Minimal right basilar atelectasis noted.  Lungs otherwise clear.
3. Borderline cardiomegaly.

## 2016-08-01 IMAGING — CR DG CHEST 1V PORT
1 series · 1 of 1 positions shown · non-contrast
Comparison: Single view of the chest 04/27/2015.

CLINICAL DATA: Status post intubation.

EXAM:
PORTABLE CHEST 1 VIEW

[AP]
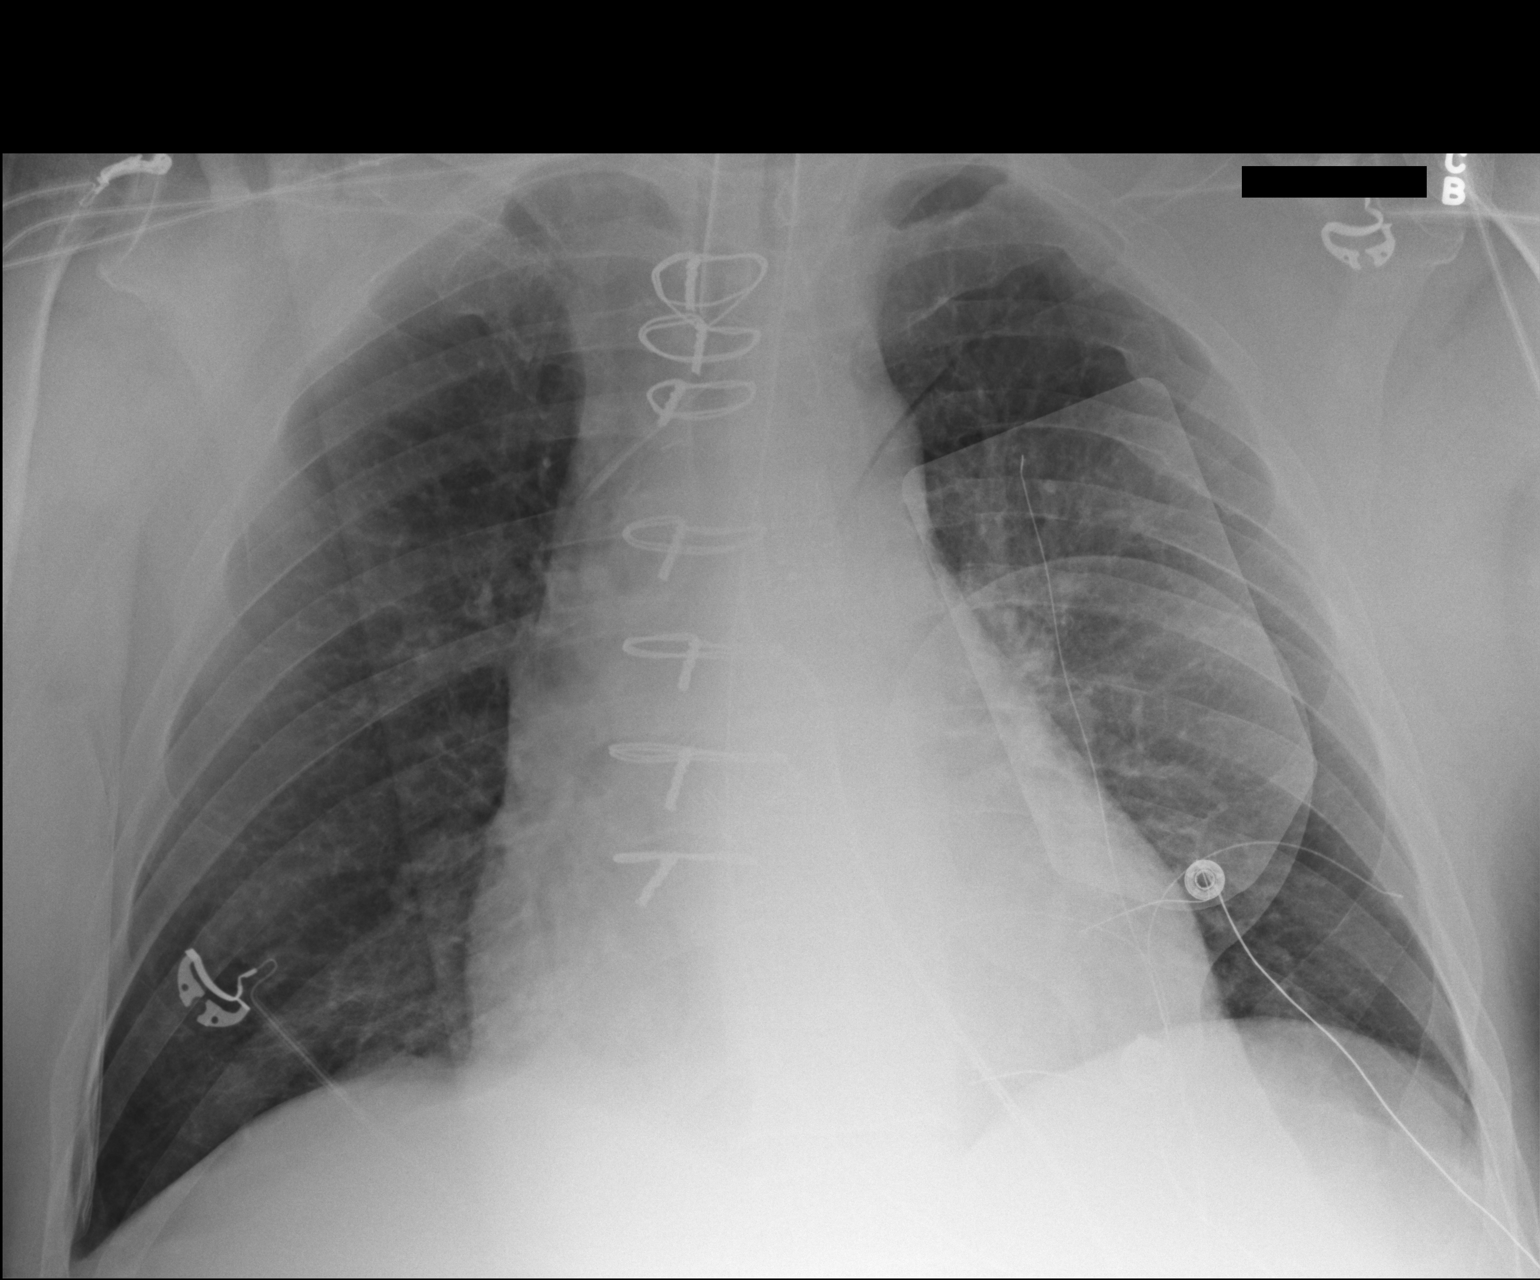

[1 of 1 positions shown; findings below may reference images not displayed]

FINDINGS: Endotracheal tube is in place with tip in good position just below
the clavicular heads. Left IJ catheter and NG tube are unchanged.
Lungs are clear. Heart size is mildly enlarged. No pneumothorax or
pleural effusion. Defibrillator pads noted.
IMPRESSION: Endotracheal tube projects in good position.

Cardiomegaly without acute disease.
